# Patient Record
Sex: Female | Born: 1960
Health system: Southern US, Community
[De-identification: ages and names within clinical notes are randomized; demographics above are authoritative.]

## PROBLEM LIST (undated history)

## (undated) DIAGNOSIS — H269 Unspecified cataract: Secondary | ICD-10-CM

## (undated) DIAGNOSIS — M26629 Arthralgia of temporomandibular joint, unspecified side: Secondary | ICD-10-CM

## (undated) DIAGNOSIS — Z87442 Personal history of urinary calculi: Secondary | ICD-10-CM

## (undated) DIAGNOSIS — K219 Gastro-esophageal reflux disease without esophagitis: Secondary | ICD-10-CM

## (undated) DIAGNOSIS — I10 Essential (primary) hypertension: Secondary | ICD-10-CM

## (undated) DIAGNOSIS — T7840XA Allergy, unspecified, initial encounter: Secondary | ICD-10-CM

## (undated) DIAGNOSIS — M1990 Unspecified osteoarthritis, unspecified site: Secondary | ICD-10-CM

## (undated) DIAGNOSIS — N200 Calculus of kidney: Secondary | ICD-10-CM

## (undated) DIAGNOSIS — E785 Hyperlipidemia, unspecified: Secondary | ICD-10-CM

## (undated) HISTORY — PX: ROTATOR CUFF REPAIR: SHX139

## (undated) HISTORY — DX: Allergy, unspecified, initial encounter: T78.40XA

## (undated) HISTORY — DX: Unspecified cataract: H26.9

## (undated) HISTORY — PX: URETERAL STENT PLACEMENT: SHX822

---

## 1998-04-08 HISTORY — PX: ABDOMINAL HYSTERECTOMY: SHX81

## 2006-02-06 ENCOUNTER — Encounter: Admission: RE | Admit: 2006-02-06 | Discharge: 2006-02-06 | Payer: Self-pay | Admitting: Family Medicine

## 2007-04-09 HISTORY — PX: CHOLECYSTECTOMY: SHX55

## 2007-10-28 ENCOUNTER — Emergency Department (HOSPITAL_COMMUNITY): Admission: EM | Admit: 2007-10-28 | Discharge: 2007-10-28 | Payer: Self-pay | Admitting: Emergency Medicine

## 2008-05-25 ENCOUNTER — Encounter (INDEPENDENT_AMBULATORY_CARE_PROVIDER_SITE_OTHER): Payer: Self-pay | Admitting: Surgery

## 2008-05-25 ENCOUNTER — Observation Stay (HOSPITAL_COMMUNITY): Admission: EM | Admit: 2008-05-25 | Discharge: 2008-05-26 | Payer: Self-pay | Admitting: *Deleted

## 2010-07-24 LAB — DIFFERENTIAL
Eosinophils Absolute: 0.1 10*3/uL (ref 0.0–0.7)
Lymphocytes Relative: 9 % — ABNORMAL LOW (ref 12–46)
Lymphs Abs: 0.6 10*3/uL — ABNORMAL LOW (ref 0.7–4.0)
Monocytes Absolute: 0.2 10*3/uL (ref 0.1–1.0)
Neutro Abs: 5.6 10*3/uL (ref 1.7–7.7)
Neutrophils Relative %: 87 % — ABNORMAL HIGH (ref 43–77)

## 2010-07-24 LAB — COMPREHENSIVE METABOLIC PANEL
ALT: 31 U/L (ref 0–35)
Albumin: 4.2 g/dL (ref 3.5–5.2)
Calcium: 9.3 mg/dL (ref 8.4–10.5)
GFR calc Af Amer: >60 mL/min (ref >60)
GFR calc non Af Amer: >60 mL/min (ref >60)
Potassium: 4.1 mEq/L (ref 3.5–5.1)
Sodium: 140 mEq/L (ref 135–145)
Total Bilirubin: 1.5 mg/dL — ABNORMAL HIGH (ref 0.3–1.2)

## 2010-07-24 LAB — POCT I-STAT, CHEM 8
BUN: 24 mg/dL — ABNORMAL HIGH (ref 6–23)
Chloride: 107 mEq/L (ref 96–112)
Glucose, Bld: 120 mg/dL — ABNORMAL HIGH (ref 70–99)
HCT: 46 % (ref 36.0–46.0)
Hemoglobin: 15.6 g/dL — ABNORMAL HIGH (ref 12.0–15.0)

## 2010-07-24 LAB — URINALYSIS, ROUTINE W REFLEX MICROSCOPIC
Bilirubin Urine: NEGATIVE
Hgb urine dipstick: NEGATIVE
Urobilinogen, UA: 1 mg/dL (ref 0.0–1.0)
pH: 8.5 — ABNORMAL HIGH (ref 5.0–8.0)

## 2010-07-24 LAB — CBC
HCT: 44.2 % (ref 36.0–46.0)
Hemoglobin: 15.1 g/dL — ABNORMAL HIGH (ref 12.0–15.0)
MCHC: 34.3 g/dL (ref 30.0–36.0)
MCV: 86.7 fL (ref 78.0–100.0)
RDW: 13.4 % (ref 11.5–15.5)
WBC: 6.4 10*3/uL (ref 4.0–10.5)

## 2010-08-21 NOTE — Consult Note (Signed)
NAMECALLAHAN, PEDDIE                   ACCOUNT NO.:  [id number]   MEDICAL RECORD NO.:  [id number]          PATIENT TYPE:  EMS   LOCATION:  MAJO                         FACILITY:  [facility]   PHYSICIAN:  Thomas A. Cornett, M.D.DATE OF BIRTH:  05-06-1960   DATE OF CONSULTATION:  DATE OF DISCHARGE:                                 CONSULTATION   REQUESTING PHYSICIAN:  Chriss Driver, MD.   REASON FOR CONSULTATION:  Abdominal pain.   HISTORY OF PRESENT ILLNESS:  The patient is a 50 year old female with  about an 8-hour history of severe right upper quadrant pain.  The pain  is in her right upper quadrant.  It is roughly a 6-7/10, constant in  nature, associated with nausea, vomiting, and diarrhea.  It started  about 12:30 this morning and has lasted about 8 hours.  She has been  seen here in the emergency room and given pain medicine, but her pain  persists.  Denies any fever or chills.  Has a history of kidney stones.  Pain is no better with pain medicine, and nothing seems to be helping  it.  She was seen by Dr. Javier Glazier in the emergency room who requested me to  see her due to right upper quadrant pain.  Ultrasound was obtained,  which shows a solitary gallstone without gallbladder wall thickening.  No history of yellowing of the skin.   PAST MEDICAL HISTORY:  1. Renal calculi.  2. Herpes.  3. Gastroesophageal reflux disease.  4. She is post menopausal.   PAST SURGICAL HISTORY:  None.   MEDICATIONS:  Enablex, Valtrex, Naprosyn, Vivelle-Dot patch 5 mg,  Zantac, and Flomax as needed.   ALLERGIES:  CODEINE, MACRODANTIN, and PERCOCET.   FAMILY HISTORY:  Noncontributory during this admission.   SOCIAL HISTORY:  Denies tobacco or alcohol use.  She is married.   REVIEW OF SYSTEMS:  An 8-point review of systems reviewed.  Positive for  mild shortness of breath.  Negative for chest pain.  Negative for  extremity pain.  Negative for headache.  Negative for dizziness.  Positive for some anorexia,  nausea, vomiting and diarrhea, and right  upper quadrant pain.  Otherwise, 8-point review of systems which was  done today was negative.   PHYSICAL EXAMINATION:  VITAL SIGNS:  Temperature 97, pulse 110, blood  pressure 129/81, and respiratory rate is 15.  GENERAL APPEARANCE:  Pleasant female in no apparent distress.  HEENT:  No evidence of jaundice.  The oropharynx is dry.  NECK:  Supple.  Nontender.  Trachea is midline.  No mass.  PULMONARY:  Lungs are clear bilaterally.  Chest wall excursion is normal  and symmetrical.  CARDIOVASCULAR:  Tachycardia without rub, murmur, or gallop.  EXTREMITIES:  Warm and well perfused.  Pulses are 2+ in all four  extremities.  ABDOMEN:  Tender right upper quadrant with positive rebound and  questionable Murphy's sign on physical examination.  No distention.  No  diffuse peritonitis.  No masses or hernia.  EXTREMITIES:  No evidence of edema.  Muscle tone and range of motion  appeared normal.  NEUROLOGICAL:  Glasgow Coma Scale was 15.  Motor and sensory function  are grossly intact.   DIAGNOSTIC STUDIES:  Abdominal ultrasound shows a stone at the  gallbladder neck.  No gallbladder wall thickening.  Common bile duct is  4.2 mm.  White count of 6400, hemoglobin of 15, and platelet count of  174,000.  Sodium 140, potassium 4.1, chloride 103, CO2 24, BUN 22,  creatinine 0.8, and glucose 116.  Liver functions are within normal  limits except for a bilirubin total of 1.5.  Alkaline phosphatase is  normal.   IMPRESSION:  1. Probable early acute cholecystitis by physical examination.  2. History of herpes.  3. Gastroesophageal reflux disease.  4  History of renal calculi.   PLAN:  I have recommended laparoscopic cholecystectomy with  cholangiogram to Ms. Caskey.  By examination, she appears to be having the  beginning of acute cholecystitis and her pain is not being not resolved  with analgesia.  Risk of bleeding, infection, common duct injury, injury  to  stomach, liver, colon, small bowel, abdominal wall, potential blood  clots, are all potential complications of this procedure.  Risk of  conversion as well was discussed with her.  Alternate therapies are  ineffective for this and since she is not responding to medical means,  surgery is her best option.  I have discussed this with her husband and  her, and they agree to proceed.  We will go ahead and set her up for  laparoscopic cholecystectomy with cholangiogram later on today.  We will  go ahead and admit her for IV fluids and antibiotics.      Thomas A. Cornett, M.D.  Electronically Signed     TAC/MEDQ  D:  05/25/2008  T:  05/25/2008  Job:  [id number]   cc:   Chriss Driver, MD

## 2010-08-21 NOTE — Discharge Summary (Signed)
NAMEMARIPAT, Laura Mills                   ACCOUNT NO.:  [id number]   MEDICAL RECORD NO.:  [id number]          PATIENT TYPE:  INP   LOCATION:  5124                         FACILITY:  [facility]   PHYSICIAN:  Maisie Fus A. Cornett, M.D.DATE OF BIRTH:  November 21, 1960   DATE OF ADMISSION:  05/25/2008  DATE OF DISCHARGE:  05/26/2008                               DISCHARGE SUMMARY   ADMITTING DIAGNOSIS:  Acute cholecystitis.   DISCHARGE DIAGNOSIS:  Acute cholecystitis.   PROCEDURE PERFORMED:  Laparoscopic cholecystectomy with intraoperative  cholangiogram.   BRIEF HISTORY:  The patient is a 50 year old female admitted on May 25, 2008, with acute cholecystitis.  She underwent laparoscopic  cholecystectomy.   HOSPITAL COURSE:  Her course is unremarkable.  By postop day 1, she is  discharged home, tolerating her diet with adequate pain control.  Her  wound is clean, dry, and intact.  Abdomen was sore, but otherwise  nonacute.   DISCHARGE INSTRUCTIONS:  She will see me in 2 weeks.  She will be given  a prescription for Vicodin 1-2 tablets q.4 h. p.r.n. pain and may take  ibuprofen 600 mg p.o. q.6 h. p.r.n. pain as well.  She will resume her  home meds as outlined in the medical reconciliation list.   CONDITION AT DISCHARGE:  Improved.      Thomas A. Cornett, M.D.  Electronically Signed     TAC/MEDQ  D:  05/26/2008  T:  05/26/2008  Job:  [id number]

## 2010-08-21 NOTE — Op Note (Signed)
Laura Mills, Laura Mills                   ACCOUNT NO.:  [id number]   MEDICAL RECORD NO.:  [id number]          PATIENT TYPE:  INP   LOCATION:  5124                         FACILITY:  [facility]   PHYSICIAN:  Maisie Fus A. Cornett, M.D.DATE OF BIRTH:  1960/11/18   DATE OF PROCEDURE:  05/25/2008  DATE OF DISCHARGE:                               OPERATIVE REPORT   PREOPERATIVE DIAGNOSIS:  Acute cholecystitis.   POSTOPERATIVE DIAGNOSIS:  Acute cholecystitis.   PROCEDURE:  Laparoscopic cholecystectomy with intraoperative  cholangiogram.   SURGEON:  Thomas A. Cornett, MD   ASSISTANT:  Ollen Gross. Vernell Morgans, MD   ANESTHESIA:  General endotracheal anesthesia with 0.25% Sensorcaine  local.   ESTIMATED BLOOD LOSS:  20 mL.   SPECIMEN:  Gallbladder to pathology.   DRAINS:  None.   INDICATIONS FOR PROCEDURE:  The patient is a 50 year old female with  acute cholecystitis.  She presents to the operating room for  laparoscopic cholecystectomy with cholangiogram for acute cholecystitis.  Informed consent was obtained which is outlined in the history and  physical.   DESCRIPTION OF PROCEDURE:  The patient was brought to the operating room  and placed supine.  After induction of general anesthesia, the abdomen  was prepped and draped in a sterile fashion.  She received perioperative  antibiotics.  A 1-cm infraumbilical incision was made.  Dissection was  carried down to her fascia.  Fascia was opened in the midline and a  pursestring suture was placed in the fascia of 0 Vicryl.  I used a Kelly  clamp to open the peritoneum, placed my finger inside the abdominal  cavity, felt around and felt no adhesions.  Next, a 12-mm Hasson cannula  was placed under direct vision.  Pneumoperitoneum was created to 15 mmHg  of CO2 and a laparoscope was placed.  Laparoscopy was then performed.  The patient was placed in reverse Trendelenburg and rolled to her left.  Brief examination of the intra-abdominal cavity revealed an  inflamed  gallbladder, otherwise no other significant abnormality of the colon,  small bowel, colon or liver.   A 11-mm subxiphoid port was placed under direct vision.  Two 5 mm ports  were placed in the right upper quadrant under direct vision.  Gallbladder was grabbed by its dome and retracted to the patient's right  shoulder.  Second grasper was used to grab the infundibulum and pulled  to the patient's right lower quadrant.  We used dissection around the  infundibulum to identify the cystic duct as it entered the gallbladder.  Adequate length was obtained in the cystic duct and a clip was placed in  the gallbladder side of this.  Small incision was placed in the cystic  duct and through a separate stab incision, a Cook cholangiogram catheter  was placed for intraoperative cholangiogram.  This was performed with  one half-strength Hypaque dye.  There was free flow of contrast down the  cystic duct into the common bile duct with free flow of contrast from  the common bile duct into the duodenum.  There was free flow of contrast  into the common hepatic duct to the right and left hepatic ductules with  no signs of stones, stricture, extravasation, or obstruction.  The  catheter was then removed.  The cystic duct was then triple clipped and  divided.  Cystic artery was divided between clips.  Cautery was used to  dissect the gallbladder from gallbladder fossa.  Small clips were placed  in the posterior branches of the cystic artery.  There was some leakage  of bile from the gallbladder, but no spillage or stones noted.  Hook  cautery was used to dissect the gallbladder from the gallbladder fossa  and placed it in an EndoCatch bag.  There was some oozing in liver bed,  controlled with cautery and Surgicel.  Irrigation was suctioned out  until clear.  The patient was then flattened out.  Gallbladder was  extracted through the umbilicus and passed off the field as specimen.  EndoCatch bag  was used for this.  We reinspected the gallbladder bed,  saw no evidence of bleeding or bile leakage.  Excess irrigation was  suctioned out.  Ports were removed with no signs of port site bleeding  and the CO2 was allowed to escape.  Umbilical port was closed with  pursestring suture of 0 Vicryl already in place.  Once the ports were  removed, 4-0 Monocryl was used to close skin.  Dermabond was applied.  All final counts of sponge, needle, and instruments were found to be  correct at this portion of the case.  The patient was awoken and taken  to recovery in satisfactory condition.      Thomas A. Cornett, M.D.  Electronically Signed     TAC/MEDQ  D:  05/25/2008  T:  05/26/2008  Job:  [id number]

## 2011-01-04 LAB — URINALYSIS, ROUTINE W REFLEX MICROSCOPIC
Bilirubin Urine: NEGATIVE
Ketones, ur: NEGATIVE
Urobilinogen, UA: 1
pH: 6

## 2011-01-04 LAB — POCT I-STAT, CHEM 8
Chloride: 107
Glucose, Bld: 94
Potassium: 4.5

## 2011-01-04 LAB — CBC
HCT: 40.1
Hemoglobin: 13.4
MCV: 88.3
Platelets: 235
WBC: 5.6

## 2011-01-04 LAB — URINE MICROSCOPIC-ADD ON

## 2011-01-04 LAB — OCCULT BLOOD X 1 CARD TO LAB, STOOL: Fecal Occult Bld: NEGATIVE

## 2011-01-04 LAB — DIFFERENTIAL
Basophils Relative: 1
Lymphs Abs: 2.6
Neutro Abs: 2.3
Neutrophils Relative %: 42 — ABNORMAL LOW

## 2011-12-31 ENCOUNTER — Encounter (HOSPITAL_COMMUNITY): Payer: Self-pay | Admitting: Emergency Medicine

## 2011-12-31 ENCOUNTER — Emergency Department (HOSPITAL_COMMUNITY): Payer: Managed Care, Other (non HMO)

## 2011-12-31 ENCOUNTER — Emergency Department (HOSPITAL_COMMUNITY)
Admission: EM | Admit: 2011-12-31 | Discharge: 2011-12-31 | Disposition: A | Discharge status: Home / Self Care | Payer: Managed Care, Other (non HMO) | Attending: Emergency Medicine | Admitting: Emergency Medicine

## 2011-12-31 DIAGNOSIS — R109 Unspecified abdominal pain: Secondary | ICD-10-CM | POA: Insufficient documentation

## 2011-12-31 DIAGNOSIS — N201 Calculus of ureter: Secondary | ICD-10-CM | POA: Insufficient documentation

## 2011-12-31 DIAGNOSIS — N390 Urinary tract infection, site not specified: Secondary | ICD-10-CM | POA: Insufficient documentation

## 2011-12-31 DIAGNOSIS — Z90710 Acquired absence of both cervix and uterus: Secondary | ICD-10-CM | POA: Insufficient documentation

## 2011-12-31 DIAGNOSIS — N200 Calculus of kidney: Secondary | ICD-10-CM

## 2011-12-31 LAB — CBC WITH DIFFERENTIAL/PLATELET
Basophils Absolute: 0 10*3/uL (ref 0.0–0.1)
Basophils Relative: 1 % (ref 0–1)
Eosinophils Absolute: 0.1 10*3/uL (ref 0.0–0.7)
Eosinophils Relative: 1 % (ref 0–5)
Lymphs Abs: 2.3 10*3/uL (ref 0.7–4.0)
MCH: 28.2 pg (ref 26.0–34.0)
Neutrophils Relative %: 56 % (ref 43–77)
Platelets: 212 10*3/uL (ref 150–400)
RBC: 5.42 MIL/uL — ABNORMAL HIGH (ref 3.87–5.11)
RDW: 14 % (ref 11.5–15.5)
WBC: 6.4 10*3/uL (ref 4.0–10.5)

## 2011-12-31 LAB — URINALYSIS, ROUTINE W REFLEX MICROSCOPIC
Bilirubin Urine: NEGATIVE
Glucose, UA: NEGATIVE mg/dL
Ketones, ur: NEGATIVE mg/dL
Protein, ur: NEGATIVE mg/dL
pH: 7.5 (ref 5.0–8.0)

## 2011-12-31 LAB — URINE MICROSCOPIC-ADD ON

## 2011-12-31 LAB — WET PREP, GENITAL
Trich, Wet Prep: NONE SEEN
Yeast Wet Prep HPF POC: NONE SEEN

## 2011-12-31 MED ORDER — KETOROLAC TROMETHAMINE 60 MG/2ML IM SOLN
60.0000 mg | Freq: Once | INTRAMUSCULAR | Status: AC
  Administered 2011-12-31: 60 mg via INTRAMUSCULAR
  Filled 2011-12-31: qty 2

## 2011-12-31 MED ORDER — CIPROFLOXACIN HCL 500 MG PO TABS: 500.0000 mg | ORAL_TABLET | Freq: Two times a day (BID) | ORAL | Status: DC

## 2011-12-31 MED ORDER — HYDROCODONE-ACETAMINOPHEN 5-325 MG PO TABS: 1.0000 | ORAL_TABLET | Freq: Four times a day (QID) | ORAL | Status: DC | PRN

## 2011-12-31 MED ORDER — ONDANSETRON HCL 4 MG/2ML IJ SOLN
4.0000 mg | Freq: Once | INTRAMUSCULAR | Status: AC
  Administered 2011-12-31: 4 mg via INTRAVENOUS
  Filled 2011-12-31: qty 2

## 2011-12-31 MED ORDER — HYDROMORPHONE HCL PF 1 MG/ML IJ SOLN
1.0000 mg | Freq: Once | INTRAMUSCULAR | Status: AC
  Administered 2011-12-31: 1 mg via INTRAVENOUS
  Filled 2011-12-31: qty 1

## 2011-12-31 MED ORDER — ONDANSETRON 4 MG PO TBDP
4.0000 mg | ORAL_TABLET | Freq: Once | ORAL | Status: AC
  Administered 2011-12-31: 4 mg via ORAL
  Filled 2011-12-31: qty 1

## 2011-12-31 MED ORDER — MORPHINE SULFATE 4 MG/ML IJ SOLN
6.0000 mg | Freq: Once | INTRAMUSCULAR | Status: AC
  Administered 2011-12-31: 6 mg via INTRAVENOUS
  Filled 2011-12-31 (×2): qty 1

## 2011-12-31 MED ORDER — CIPROFLOXACIN IN D5W 400 MG/200ML IV SOLN
400.0000 mg | Freq: Once | INTRAVENOUS | Status: AC
  Administered 2011-12-31: 400 mg via INTRAVENOUS
  Filled 2011-12-31: qty 200

## 2011-12-31 NOTE — ED Notes (Signed)
Patient transported to CT 

## 2011-12-31 NOTE — ED Notes (Signed)
PT. WOKE UP THIS MORNING WITH MID/LOW BACK PAIN AND VAGINAL PAIN WITH VOMITTING STATES PAIN SIMILAR TO HER KIDNEY STONES IN THE PAST. DENIES HEMATURIA.

## 2011-12-31 NOTE — ED Notes (Signed)
Report received, assumed care.  

## 2011-12-31 NOTE — ED Notes (Signed)
Pt sts allergic to Cipro, chart taken to NP Felicie Morn for eval and he changed rx to  Keflex 500mg  BID x 7 Days,  Pt notified.  Called into [facility] at [phone]

## 2011-12-31 NOTE — ED Provider Notes (Signed)
Medical screening examination/treatment/procedure(s) were performed by non-physician practitioner and as supervising physician I was immediately available for consultation/collaboration.  Rosanne Ashing, MD 12/31/11 2258

## 2011-12-31 NOTE — ED Provider Notes (Signed)
History     CSN: [id number]  Arrival date & time 12/31/11  4098   First MD Initiated Contact with Patient 12/31/11 [phone]      Chief Complaint  Patient presents with  . Back Pain    (Consider location/radiation/quality/duration/timing/severity/associated sxs/prior treatment) HPI  51 year old female with history of kidney stones presents complaining of back pain. Patient reports she was awoke this morning with sharp pain to her vagina which radiates up toward her right flank. Pain acute on onset, sharp, constant, and reminiscent of the previous kidney stone she had in the past. Patient felt nausea and vomited twice with non-bilious, nonbloody vomits.  Nothing seems to make pain worse, or better. Patient reports she was feeling normal last night. She denies fever, chills, chest pain, shortness of breath, dysuria, vaginal discharge, blood in urine, or rash. She has a history of cholecystectomy and history of abdominal hysterectomy.  Last kidney stones several years ago.    Past Medical History  Diagnosis Date  . Kidney stones     Past Surgical History  Procedure Date  . Cholecystectomy   . Abdominal hysterectomy     No family history on file.  History  Substance Use Topics  . Smoking status: Never Smoker   . Smokeless tobacco: Not on file  . Alcohol Use: No    OB History    Grav Para Term Preterm Abortions TAB SAB Ect Mult Living                  Review of Systems  All other systems reviewed and are negative.    Allergies  Minocin  Home Medications  No current outpatient prescriptions on file.  BP 170/91  Pulse 66  Temp 98.9 F (37.2 C) (Oral)  Resp 16  SpO2 100%  Physical Exam  Nursing note and vitals reviewed. Constitutional: She appears well-developed and well-nourished. She appears distressed.       Appears uncomfortable, lying in bed  HENT:  Head: Normocephalic and atraumatic.  Eyes: Conjunctivae normal are normal.  Neck: Normal range of motion.  Neck supple.  Cardiovascular: Normal rate and regular rhythm.   Abdominal: Soft. There is CVA tenderness. Hernia confirmed negative in the right inguinal area and confirmed negative in the left inguinal area.  Genitourinary: There is no tenderness on the right labia. There is no tenderness on the left labia. Right adnexum displays no tenderness. Left adnexum displays no tenderness. No bleeding around the vagina. No vaginal discharge found.       Chaperone present  Absent uterus from total hysterectomy  Musculoskeletal:       Cervical back: Normal.       Thoracic back: Normal.       Lumbar back: Normal.       Back:  Lymphadenopathy:    She has no cervical adenopathy.    ED Course  Procedures (including critical care time)   Labs Reviewed  URINALYSIS, ROUTINE W REFLEX MICROSCOPIC   Results for orders placed during the hospital encounter of 12/31/11  URINALYSIS, ROUTINE W REFLEX MICROSCOPIC      Component Value Range   Color, Urine YELLOW  YELLOW   APPearance CLOUDY (*) CLEAR   Specific Gravity, Urine 1.017  1.005 - 1.030   pH 7.5  5.0 - 8.0   Glucose, UA NEGATIVE  NEGATIVE mg/dL   Hgb urine dipstick SMALL (*) NEGATIVE   Bilirubin Urine NEGATIVE  NEGATIVE   Ketones, ur NEGATIVE  NEGATIVE mg/dL   Protein, ur NEGATIVE  NEGATIVE mg/dL   Urobilinogen, UA 1.0  0.0 - 1.0 mg/dL   Nitrite NEGATIVE  NEGATIVE   Leukocytes, UA MODERATE (*) NEGATIVE  URINE MICROSCOPIC-ADD ON      Component Value Range   Squamous Epithelial / LPF FEW (*) RARE   WBC, UA 7-10  <3 WBC/hpf   RBC / HPF 3-6  <3 RBC/hpf   Bacteria, UA FEW (*) RARE   Urine-Other AMORPHOUS URATES/PHOSPHATES    CBC WITH DIFFERENTIAL      Component Value Range   WBC 6.4  4.0 - 10.5 K/uL   RBC 5.42 (*) 3.87 - 5.11 MIL/uL   Hemoglobin 15.3 (*) 12.0 - 15.0 g/dL   HCT 16.1  09.6 - 04.5 %   MCV 82.5  78.0 - 100.0 fL   MCH 28.2  26.0 - 34.0 pg   MCHC 34.2  30.0 - 36.0 g/dL   RDW 40.9  81.1 - 91.4 %   Platelets 212  150 - 400  K/uL   Neutrophils Relative 56  43 - 77 %   Neutro Abs 3.6  1.7 - 7.7 K/uL   Lymphocytes Relative 36  12 - 46 %   Lymphs Abs 2.3  0.7 - 4.0 K/uL   Monocytes Relative 6  3 - 12 %   Monocytes Absolute 0.4  0.1 - 1.0 K/uL   Eosinophils Relative 1  0 - 5 %   Eosinophils Absolute 0.1  0.0 - 0.7 K/uL   Basophils Relative 1  0 - 1 %   Basophils Absolute 0.0  0.0 - 0.1 K/uL  WET PREP, GENITAL      Component Value Range   Yeast Wet Prep HPF POC NONE SEEN  NONE SEEN   Trich, Wet Prep NONE SEEN  NONE SEEN   Clue Cells Wet Prep HPF POC FEW (*) NONE SEEN   WBC, Wet Prep HPF POC FEW (*) NONE SEEN   Ct Abdomen Pelvis Wo Contrast  12/31/2011  *RADIOLOGY REPORT*  Clinical Data: Severe right flank and groin pain with nausea and vomiting.  History of renal calculi.  CT ABDOMEN AND PELVIS WITHOUT CONTRAST  Technique:  Multidetector CT imaging of the abdomen and pelvis was performed following the standard protocol without intravenous contrast.  Comparison: Abdominal pelvic CT 01/31/2010.  Findings: Patchy opacity within the lingula may reflect atelectasis.  The lung bases are otherwise clear.  There is no pleural effusion.  Two adjacent calculi within the lower pole of the right kidney have mildly enlarged, the largest measuring 6 mm on image 42.  No other renal calculi are seen.  There is mild right-sided hydronephrosis and perinephric soft tissue stranding.  The right ureter is dilated to the ureteral vesicle junction where there is a tiny (1 mm) calculus on image 75. The bladder is nearly empty and suboptimally evaluated.  No definite bladder calculi are seen.  There are bilateral pelvic phleboliths.  There is no left-sided hydronephrosis or left-sided urinary tract calculus.  There is mild hepatic steatosis.  The gallbladder is surgically absent.  The spleen, pancreas and adrenal glands appear unremarkable.  The bowel gas pattern is normal.  No pelvic inflammatory changes are seen.  There are mild sigmoid colon  diverticular changes.  Lower lumbar spondylosis associated with a convex left scoliosis appears unchanged.  IMPRESSION:  1.  Obstructing 1 mm calculus at the right ureteral vesicle junction. 2.  Enlarging calculi in the lower pole calices of the right kidney. 3.  Lumbar spondylosis and scoliosis.  Original Report Authenticated By: Gerrianne Scale, M.D.     1. Kidney stone, right 2. uti MDM  R flank pain radiates to vagina, prior hx of kidney stones.  Sts pain felt similar.  Currently in pain.  Will obtain UA, give pain meds and zofran.  Will reassess once pt more comfortable.  VSS.  Hypertensive likely 2/2 pain.    7:26 AM Pt feels more comfortable after pain medication.  Her pain is localized to RLQ.  Her UA shows small amount of Hgb, 7-10 WBC.  I will send a urine culture.  Will perform pelvic exam for further evaluatuion.    7:51 AM Pt is vomiting, having increased abd pain.  Will give morphine, labs ordered  9:37 AM Abd/pelvic CT shows 1mm obstructing calculus at the R UVJ, which is consistent with pt's current pain.  She has 2 renal calculi in the R kidney, with largest measuring 6mm.  She has mild R sided hydronephrosis.  REsult were discussed with pt.  Pt still endorse significant pain.  Will continue pain management.  Will give cipro for suspected UTI.  My attending has seen and evaluate pt.    1:15 PM Pt felt much better.  Able to tolerates PO.  abx has finished.  WIll d/c pt with recommendation to f/u with urologist. Pt voice understanding and agrees with plan.    BP 127/77  Pulse 85  Temp 98.7 F (37.1 C) (Oral)  Resp 20  SpO2 96%   Nursing notes reviewed and considered in documentation  Previous records reviewed and considered  All labs/vitals reviewed and considered  xrays reviewed and considered  time spent personally by me on the following activities: Review prior charts, imaging, and labs.  Development of treatment plan with patient and/or surrogate as well as  nursing, discussions with consultants, evaluation of patient's response to treatment, examination of patient, obtaining history from patient or surrogate, ordering and performing treatments and interventions, ordering and review of laboratory studies, ordering and review of radiographic studies, pulse oximetry and re-evaluation of patient's condition.     Fayrene Helper, PA-C 12/31/11 1318

## 2011-12-31 NOTE — ED Notes (Signed)
Results of chem 8 per POCT standards listed below: Na   142 K  4.3 Cl  106 iCa  1.25 TC02   24 Glu  110 BUN  22 Crea  1.0 Hct   46% Hb   15.6 AnGap   

## 2011-12-31 NOTE — ED Notes (Signed)
Pt given Vicodin and Cipro prescription.

## 2012-01-01 LAB — URINE CULTURE: Colony Count: 75000

## 2012-01-01 LAB — POCT I-STAT, CHEM 8
Calcium, Ion: 1.25 mmol/L — ABNORMAL HIGH (ref 1.12–1.23)
HCT: 46 % (ref 36.0–46.0)
Hemoglobin: 15.6 g/dL — ABNORMAL HIGH (ref 12.0–15.0)
Sodium: 142 mEq/L (ref 135–145)
TCO2: 24 mmol/L (ref 0–100)

## 2012-01-01 LAB — GC/CHLAMYDIA PROBE AMP, GENITAL: GC Probe Amp, Genital: NEGATIVE

## 2012-09-04 ENCOUNTER — Other Ambulatory Visit: Payer: Self-pay | Admitting: Urology

## 2012-09-28 ENCOUNTER — Encounter (HOSPITAL_COMMUNITY): Payer: Self-pay | Admitting: Pharmacy Technician

## 2012-09-29 ENCOUNTER — Encounter (HOSPITAL_COMMUNITY): Payer: Self-pay

## 2012-09-29 ENCOUNTER — Encounter (HOSPITAL_COMMUNITY)
Admission: RE | Admit: 2012-09-29 | Discharge: 2012-09-29 | Disposition: A | Discharge status: Home / Self Care | Payer: 59 | Source: Ambulatory Visit | Attending: Urology | Admitting: Urology

## 2012-09-29 HISTORY — DX: Personal history of urinary calculi: Z87.442

## 2012-09-29 HISTORY — DX: Arthralgia of temporomandibular joint, unspecified side: M26.629

## 2012-09-29 HISTORY — DX: Gastro-esophageal reflux disease without esophagitis: K21.9

## 2012-09-29 HISTORY — DX: Unspecified osteoarthritis, unspecified site: M19.90

## 2012-09-29 LAB — BASIC METABOLIC PANEL
Calcium: 9.5 mg/dL (ref 8.4–10.5)
GFR calc Af Amer: >90 mL/min (ref >90)
GFR calc non Af Amer: >90 mL/min (ref >90)
Glucose, Bld: 81 mg/dL (ref 70–99)
Potassium: 4.4 mEq/L (ref 3.5–5.1)
Sodium: 140 mEq/L (ref 135–145)

## 2012-09-29 LAB — CBC
Hemoglobin: 14.2 g/dL (ref 12.0–15.0)
MCH: 28 pg (ref 26.0–34.0)
MCHC: 33.4 g/dL (ref 30.0–36.0)
RDW: 13 % (ref 11.5–15.5)

## 2012-09-29 LAB — SURGICAL PCR SCREEN
MRSA, PCR: NEGATIVE
Staphylococcus aureus: POSITIVE — AB

## 2012-09-29 NOTE — Patient Instructions (Signed)
YOUR SURGERY IS SCHEDULED AT [facility]  ON:  Friday 6/27  REPORT TO [place] [facility] AT: 9:45 AM      PHONE # FOR SHORT STAY IS [phone]  DO NOT EAT OR DRINK ANYTHING AFTER MIDNIGHT THE NIGHT BEFORE YOUR SURGERY.  YOU MAY BRUSH YOUR TEETH, RINSE OUT YOUR MOUTH--BUT NO WATER, NO FOOD, NO CHEWING GUM, NO MINTS, NO CANDIES, NO CHEWING TOBACCO.  PLEASE TAKE THE FOLLOWING MEDICATIONS THE AM OF YOUR SURGERY WITH A FEW SIPS OF WATER:  OXYBUTYNIN - MAY TAKE PAIN MED DEMEROL IF NEEDED.  DO NOT BRING VALUABLES, MONEY, CREDIT CARDS.  DO NOT WEAR JEWELRY, MAKE-UP, NAIL POLISH AND NO METAL PINS OR CLIPS IN YOUR HAIR. CONTACT LENS, DENTURES / PARTIALS, GLASSES SHOULD NOT BE WORN TO SURGERY AND IN MOST CASES-HEARING AIDS WILL NEED TO BE REMOVED.  BRING YOUR GLASSES CASE, ANY EQUIPMENT NEEDED FOR YOUR CONTACT LENS. FOR PATIENTS ADMITTED TO THE HOSPITAL--CHECK OUT TIME THE DAY OF DISCHARGE IS 11:00 AM.  ALL INPATIENT ROOMS ARE PRIVATE - WITH BATHROOM, TELEPHONE, TELEVISION AND WIFI INTERNET.  IF YOU ARE BEING DISCHARGED THE SAME DAY OF YOUR SURGERY--YOU CAN NOT DRIVE YOURSELF HOME--AND SHOULD NOT GO HOME ALONE BY TAXI OR BUS.  NO DRIVING OR OPERATING MACHINERY FOR 24 HOURS FOLLOWING ANESTHESIA / PAIN MEDICATIONS.  PLEASE MAKE ARRANGEMENTS FOR SOMEONE TO BE WITH YOU AT HOME THE FIRST 24 HOURS AFTER SURGERY. RESPONSIBLE DRIVER'S NAME JOHN Thurlow - PT'S HUSBAND                                               PHONE #  [phone]                            PLEASE READ OVER ANY  FACT SHEETS THAT YOU WERE GIVEN: MRSA INFORMATION, BLOOD TRANSFUSION INFORMATION, INCENTIVE SPIROMETER INFORMATION. FAILURE TO FOLLOW THESE INSTRUCTIONS MAY RESULT IN THE CANCELLATION OF YOUR SURGERY.   PATIENT SIGNATURE_________________________________

## 2012-09-29 NOTE — Pre-Procedure Instructions (Signed)
EKG AND CXR NOT NEEDED PREOP - PER ANESTHESIOLOGIST'S GUIDELINES. 

## 2012-10-01 ENCOUNTER — Other Ambulatory Visit (HOSPITAL_COMMUNITY): Payer: Managed Care, Other (non HMO)

## 2012-10-01 NOTE — H&P (Signed)
History of Present Illness  H&P:            F/u nephrolithiasis. She has always passed her stones. She did pass a calcium oxalate stone. A 24-hour urine for stone risk profile was done in December 2009. Her urinary calcium was slightly elevated and her volume was low at 1300 cc.   -2012 - CT scan however showed two 5 mm stones in the right lower pole that were nonobstructing     -Sept 2013 flank pain, CT A/P - I reviewed all the images. This revealed a 1-2 mm stone at the right ureterovesical junction with mild hydronephrosis. There were 2 right lower pole stones total approximately 8 mm. BUN 22, creatinine 1.0. UA showed microhematuria and pyuria and a urine culture was negative.    She also has some fairly significant orthopedic problems with the right lower back where she has a degenerative joint disease. She takes robaxin, hydrocodone and meloxicam.   Only operation was a hysterectomy in 1999.    Interval Hx  She returns and has had no stone passage or flank. No dysuria or gross hematuria.     KUB today - RLP calcification, slight increase in size, normal bowel gas pattern, bones stable.  Amended By: Jerilee Field; 09/13/2012 9:21 PMEST    Past Medical History Problems  1. History of  Abdominal Pain Above The Pubic Area (Suprapubic) 789.09 2. History of  Abdominal Pain In The Left Lower Belly (LLQ) 789.04 3. History of  Abdominal Pain In The Right Lower Belly (RLQ) 789.03 4. History of  Feelings Of Urinary Urgency 788.63 5. History of  Heartburn 787.1 6. History of  Microscopic Hematuria 599.72 7. History of  Nephrolithiasis Of The Right Kidney V13.01 8. History of  Urethritis 597.80  Surgical History Problems  1. History of  Total Abdominal Hysterectomy  Current Meds 1. Estradiol 2 MG Oral Tablet; Therapy: 22Oct2013 to 2. Meloxicam 7.5 MG Oral Tablet; Therapy: 31Aug2010 to 3. Meperidine HCl 50 MG Oral Tablet; Therapy: 13Dec2013 to 4. Methocarbamol 750 MG Oral  Tablet; Therapy: 03Jul2010 to 5. Multi-Vitamin TABS; Therapy: (Recorded:04Aug2009) to 6. Oxybutynin Chloride 5 MG Oral Tablet; Therapy: 07Jun2011 to 7. PriLOSEC 40 MG Oral Capsule Delayed Release; Therapy: (Recorded:10Aug2011) to 8. TiZANidine HCl 4 MG Oral Tablet; Therapy: 13Dec2013 to 9. ValACYclovir HCl 500 MG Oral Tablet; Therapy: 22Oct2013 to  Allergies Medication  1. Macrodantin CAPS  Family History Problems  1. Paternal history of  Anemia V18.2 2. Family history of  Family Health Status - Father's Age 60 3. Family history of  Family Health Status - Mother's Age 25 4. Family history of  Family Health Status Number Of Children 2 sons 22,18 5. Paternal history of  Heart Disease V17.49 6. Paternal history of  Hematuria 7. Paternal history of  Nephrectomy 8. Paternal history of  Nephrolithiasis 9. Paternal history of  Renal Cell Carcinoma V16.51  Social History Problems  1. Caffeine Use 1-2 per day 2. Marital History - Currently Married 3. 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 History of  Alcohol Use  Vitals Vital Signs [Data Includes: Last 1 Day]  28May2014 03:29PM  Blood Pressure: 130 / 87 Temperature: 98 F Heart Rate: 75  Physical Exam Constitutional: Well nourished and well developed . No acute distress.  Pulmonary: No respiratory distress and normal respiratory rhythm and effort.  Cardiovascular: Heart rate and rhythm are normal . No peripheral edema.  Neuro/Psych:. Mood and affect are appropriate.  Results/Data Urine [Data Includes: Last 1 Day]   28May2014  COLOR YELLOW   APPEARANCE CLEAR   SPECIFIC GRAVITY 1.030   pH 5.5   GLUCOSE NEG mg/dL  BILIRUBIN NEG   KETONE NEG mg/dL  BLOOD TRACE   PROTEIN NEG mg/dL  UROBILINOGEN 0.2 mg/dL  NITRITE NEG   LEUKOCYTE ESTERASE NEG   SQUAMOUS EPITHELIAL/HPF RARE   WBC NONE SEEN WBC/hpf  RBC 0-2 RBC/hpf  BACTERIA RARE   CRYSTALS NONE SEEN   CASTS NONE SEEN     Assessment Assessed  1. Nephrolithiasis 592.0  Plan Health Maintenance (V70.0)  1. UA With REFLEX  Done: 28May2014 02:53PM Nephrolithiasis (592.0)  2. Follow-up Schedule Surgery Office  Follow-up  Requested for: 28May2014  Discussion/Summary  We discussed the stones are a bit bigger compared to 2011 CT and 2009 KUB. She is inclined to go ahead and treat. We discussed nature, R/B of continued surveillance, endoscopic management or ESWL. She doesnt want to have to "pass" any fragments and need pain medicine and "Flomax". She wants to go ahead with endoscopic management and we discussed need for pre-stenting on occasion. We discussed stent pain. We discussed risk of ureteral injury with URS among others.    Signatures Electronically signed by : Jerilee Field, M.D.; Sep 13 2012  9:22PM

## 2012-10-02 ENCOUNTER — Ambulatory Visit (HOSPITAL_COMMUNITY): Payer: 59 | Admitting: Certified Registered Nurse Anesthetist

## 2012-10-02 ENCOUNTER — Encounter (HOSPITAL_COMMUNITY): Payer: Self-pay | Admitting: *Deleted

## 2012-10-02 ENCOUNTER — Encounter (HOSPITAL_COMMUNITY)
Admission: RE | Disposition: A | Discharge status: Home / Self Care | Payer: Self-pay | Source: Ambulatory Visit | Attending: Urology

## 2012-10-02 ENCOUNTER — Ambulatory Visit (HOSPITAL_BASED_OUTPATIENT_CLINIC_OR_DEPARTMENT_OTHER): Admission: RE | Admit: 2012-10-02 | Payer: Managed Care, Other (non HMO) | Source: Ambulatory Visit | Admitting: Urology

## 2012-10-02 ENCOUNTER — Ambulatory Visit (HOSPITAL_COMMUNITY)
Admission: RE | Admit: 2012-10-02 | Discharge: 2012-10-02 | Disposition: A | Discharge status: Home / Self Care | Payer: 59 | Source: Ambulatory Visit | Attending: Urology | Admitting: Urology

## 2012-10-02 ENCOUNTER — Encounter (HOSPITAL_BASED_OUTPATIENT_CLINIC_OR_DEPARTMENT_OTHER): Admission: RE | Payer: Self-pay | Source: Ambulatory Visit

## 2012-10-02 ENCOUNTER — Encounter (HOSPITAL_COMMUNITY): Payer: Self-pay | Admitting: Certified Registered Nurse Anesthetist

## 2012-10-02 DIAGNOSIS — N8111 Cystocele, midline: Secondary | ICD-10-CM | POA: Insufficient documentation

## 2012-10-02 DIAGNOSIS — N1330 Unspecified hydronephrosis: Secondary | ICD-10-CM | POA: Insufficient documentation

## 2012-10-02 DIAGNOSIS — M1990 Unspecified osteoarthritis, unspecified site: Secondary | ICD-10-CM | POA: Insufficient documentation

## 2012-10-02 DIAGNOSIS — N200 Calculus of kidney: Secondary | ICD-10-CM | POA: Insufficient documentation

## 2012-10-02 DIAGNOSIS — Z79899 Other long term (current) drug therapy: Secondary | ICD-10-CM | POA: Insufficient documentation

## 2012-10-02 DIAGNOSIS — Z90710 Acquired absence of both cervix and uterus: Secondary | ICD-10-CM | POA: Insufficient documentation

## 2012-10-02 HISTORY — PX: CYSTOSCOPY WITH URETEROSCOPY: SHX5123

## 2012-10-02 SURGERY — CYSTOSCOPY WITH URETEROSCOPY
Anesthesia: General | Laterality: Right | Wound class: Clean Contaminated

## 2012-10-02 SURGERY — CYSTOSCOPY, WITH CALCULUS MANIPULATION OR REMOVAL
Anesthesia: General | Laterality: Right

## 2012-10-02 MED ORDER — PROPOFOL 10 MG/ML IV BOLUS
INTRAVENOUS | Status: DC | PRN
  Administered 2012-10-02: 200 mg via INTRAVENOUS

## 2012-10-02 MED ORDER — MIDAZOLAM HCL 5 MG/5ML IJ SOLN
INTRAMUSCULAR | Status: DC | PRN
  Administered 2012-10-02: 2 mg via INTRAVENOUS

## 2012-10-02 MED ORDER — OXYCODONE-ACETAMINOPHEN 5-325 MG PO TABS
1.0000 | ORAL_TABLET | ORAL | Status: DC | PRN
  Administered 2012-10-02: 1 via ORAL
  Filled 2012-10-02: qty 1

## 2012-10-02 MED ORDER — LIDOCAINE HCL (CARDIAC) 20 MG/ML IV SOLN
INTRAVENOUS | Status: DC | PRN
  Administered 2012-10-02: 75 mg via INTRAVENOUS

## 2012-10-02 MED ORDER — PROMETHAZINE HCL 25 MG/ML IJ SOLN: 6.2500 mg | INTRAMUSCULAR | Status: DC | PRN

## 2012-10-02 MED ORDER — FENTANYL CITRATE 0.05 MG/ML IJ SOLN
INTRAMUSCULAR | Status: DC | PRN
  Administered 2012-10-02 (×2): 50 ug via INTRAVENOUS
  Administered 2012-10-02: 100 ug via INTRAVENOUS

## 2012-10-02 MED ORDER — ONDANSETRON HCL 4 MG/2ML IJ SOLN
INTRAMUSCULAR | Status: DC | PRN
  Administered 2012-10-02: 4 mg via INTRAVENOUS

## 2012-10-02 MED ORDER — CEPHALEXIN 500 MG PO CAPS: 500.0000 mg | ORAL_CAPSULE | Freq: Four times a day (QID) | ORAL | Status: DC

## 2012-10-02 MED ORDER — CEFAZOLIN SODIUM-DEXTROSE 2-3 GM-% IV SOLR
2.0000 g | INTRAVENOUS | Status: AC
  Administered 2012-10-02: 2 g via INTRAVENOUS

## 2012-10-02 MED ORDER — CEFAZOLIN SODIUM-DEXTROSE 2-3 GM-% IV SOLR
INTRAVENOUS | Status: AC
  Filled 2012-10-02: qty 50

## 2012-10-02 MED ORDER — LACTATED RINGERS IV SOLN
INTRAVENOUS | Status: DC | PRN
  Administered 2012-10-02: 11:00:00 via INTRAVENOUS

## 2012-10-02 MED ORDER — FENTANYL CITRATE 0.05 MG/ML IJ SOLN: 25.0000 ug | INTRAMUSCULAR | Status: DC | PRN

## 2012-10-02 MED ORDER — SODIUM CHLORIDE 0.9 % IR SOLN
Status: DC | PRN
  Administered 2012-10-02: 4000 mL

## 2012-10-02 MED ORDER — LACTATED RINGERS IV SOLN: INTRAVENOUS | Status: DC

## 2012-10-02 MED ORDER — IOHEXOL 300 MG/ML  SOLN
INTRAMUSCULAR | Status: DC | PRN
  Administered 2012-10-02: 10 mL

## 2012-10-02 MED ORDER — OXYCODONE-ACETAMINOPHEN 5-325 MG PO TABS: 1.0000 | ORAL_TABLET | ORAL | Status: DC | PRN

## 2012-10-02 MED ORDER — IOHEXOL 300 MG/ML  SOLN
INTRAMUSCULAR | Status: AC
  Filled 2012-10-02: qty 1

## 2012-10-02 MED ORDER — MEPERIDINE HCL 50 MG/ML IJ SOLN: 6.2500 mg | INTRAMUSCULAR | Status: DC | PRN

## 2012-10-02 SURGICAL SUPPLY — 17 items
ADAPTER CATH URET PLST 4-6FR (CATHETERS) ×1 IMPLANT
ADPR CATH URET STRL DISP 4-6FR (CATHETERS)
BAG URO CATCHER STRL LF (DRAPE) ×3 IMPLANT
CATH INTERMIT  6FR 70CM (CATHETERS) ×3 IMPLANT
CATH URET 5FR 28IN OPEN ENDED (CATHETERS) ×3 IMPLANT
CLOTH BEACON ORANGE TIMEOUT ST (SAFETY) ×3 IMPLANT
CONT SPEC 4OZ CLIKSEAL STRL BL (MISCELLANEOUS) ×1 IMPLANT
DRAPE CAMERA CLOSED 9X96 (DRAPES) ×3 IMPLANT
GLOVE BIOGEL M STRL SZ7.5 (GLOVE) ×3 IMPLANT
GOWN PREVENTION PLUS XLARGE (GOWN DISPOSABLE) ×3 IMPLANT
GOWN STRL NON-REIN LRG LVL3 (GOWN DISPOSABLE) ×3 IMPLANT
GOWN STRL REIN XL XLG (GOWN DISPOSABLE) ×3 IMPLANT
MANIFOLD NEPTUNE II (INSTRUMENTS) ×3 IMPLANT
PACK CYSTO (CUSTOM PROCEDURE TRAY) ×3 IMPLANT
STENT CONTOUR 6FRX24X.038 (STENTS) ×2 IMPLANT
TUBING CONNECTING 10 (TUBING) ×3 IMPLANT
WIRE COONS/BENSON .038X145CM (WIRE) ×3 IMPLANT

## 2012-10-02 NOTE — Anesthesia Postprocedure Evaluation (Signed)
  Anesthesia Post-op Note  Patient: Laura Mills  Procedure(s) Performed: Procedure(s) (LRB): CYSTOSCOPY WITH RIGHT RETROGRADE AND STENT PLACEMENT  (Right)  Patient Location: PACU  Anesthesia Type: General  Level of Consciousness: awake and alert   Airway and Oxygen Therapy: Patient Spontanous Breathing  Post-op Pain: mild  Post-op Assessment: Post-op Vital signs reviewed, Patient's Cardiovascular Status Stable, Respiratory Function Stable, Patent Airway and No signs of Nausea or vomiting  Last Vitals:  Filed Vitals:   10/02/12 1245  BP: 131/91  Pulse: 78  Temp: 36.4 C  Resp: 16    Post-op Vital Signs: stable   Complications: No apparent anesthesia complications

## 2012-10-02 NOTE — Transfer of Care (Signed)
Immediate Anesthesia Transfer of Care Note  Patient: Laura Mills  Procedure(s) Performed: Procedure(s): CYSTOSCOPY WITH RIGHT RETROGRADE AND STENT PLACEMENT  (Right)  Patient Location: PACU  Anesthesia Type:General  Level of Consciousness: awake, oriented and patient cooperative  Airway & Oxygen Therapy: Patient Spontanous Breathing and Patient connected to face mask oxygen  Post-op Assessment: Report given to PACU RN and Post -op Vital signs reviewed and stable  Post vital signs: Reviewed and stable  Complications: No apparent anesthesia complications

## 2012-10-02 NOTE — Op Note (Signed)
Preoperative diagnosis: Right lower pole stone Postoperative diagnosis: Right lower pole stone  Procedure: Cystoscopy Right retrograde pyelogram Right ureteral stent placement  Surgeon: Mena Goes  Anesthesia: Gen.  Findings: On cystoscopy the urethra was normal. There was a mild cystocele. The trigone and ureteral orifices.  Were in their normal orthotopic position. The right ureteral orifice was quite narrow and I was just able to access it a 5 French catheter for a retrograde. A 6 French catheter did not initially advance.   Right retrograde pyelogram - on scout imaging there is a calcification in the expected location of the right lower pole. After injection of contrast a single ureter single collecting system unit was visualized. There was no ureteral stricture, filling defect or dilation. The collecting system and renal pelvis appeared normal without filling defect or dilation apart from the lower pole which contained the stone.  Description of procedure:  After consent was obtained the patient was brought to the operating room. After adequate anesthesia she was placed in lithotomy position and prepped and draped in the usual fashion. A timeout was performed to confirm the patient and procedure. Cystoscopy was undertaken and the bladder container tumor stone or foreign body. I attempted to cannulate the right ureteral orifice with a 6 [place] open-ended catheter but the ureteral orifice was quite tight. A cone tip catheter was not readily available therefore I tried to cannulate with a 5 French catheter. This was initially difficult but did advance. Right retrograde contrast was injected. A Benson wire was advanced and coiled in the upper pole collecting system. I then tried to pass a dual-lumen exchange catheter to dilate the ureteral orifice and advance a safety wire but this would not advance despite multiple attempts. Therefore the decision was made to leave a ureteral stent to allow the  ureter to dilate. We can consider ureteroscopy or shockwave. The patient was awakened taken to recovery room in stable condition.   Complications: None  Blood loss: Minimal  Specimens: None   Drains: 6 x 24 cm right ureteral stent without tether   Disposition: Patient stable to PACU.

## 2012-10-02 NOTE — Interval H&P Note (Signed)
History and Physical Interval Note:  10/02/2012 11:12 AM  Laura Mills  has presented today for surgery, with the diagnosis of right nephrolithiasis   The various methods of treatment have been discussed with the patient and family. After consideration of risks, benefits and other options for treatment, the patient has consented to  Procedure(s): CYSTOSCOPY WITH RIGHT URETEROSCOPY AND STENT PLACEMENT  (Right) HOLMIUM LASER APPLICATION (N/A) as a surgical intervention .  The patient's history has been reviewed, patient examined, no change in status, stable for surgery.  I have reviewed the patient's chart and labs. I discussed side effects of the proposed treatment, the likelihood of the patient achieving the goals of the procedure, and any potential problems that might occur during the procedure or recuperation. We discussed again possible need for staged procedure (2nd look URS or ESWL). Also, ureteral injury and stent discomfort among others. Questions were answered to the patient's satisfaction.   She elects to proceed.    Antony Haste

## 2012-10-02 NOTE — Anesthesia Preprocedure Evaluation (Addendum)
Anesthesia Evaluation  Patient identified by MRN, date of birth, ID band Patient awake    Reviewed: Allergy & Precautions, H&P , NPO status , Patient's Chart, lab work & pertinent test results  Airway Mallampati: II TM Distance: >3 FB Neck ROM: Full    Dental no notable dental hx.    Pulmonary neg pulmonary ROS,  breath sounds clear to auscultation  Pulmonary exam normal       Cardiovascular negative cardio ROS  Rhythm:Regular Rate:Normal     Neuro/Psych negative neurological ROS  negative psych ROS   GI/Hepatic negative GI ROS, Neg liver ROS, GERD-  Medicated and Controlled,  Endo/Other  negative endocrine ROS  Renal/GU negative Renal ROS  negative genitourinary   Musculoskeletal negative musculoskeletal ROS (+)   Abdominal   Peds negative pediatric ROS (+)  Hematology negative hematology ROS (+)   Anesthesia Other Findings   Reproductive/Obstetrics negative OB ROS                           Anesthesia Physical Anesthesia Plan  ASA: I  Anesthesia Plan: General   Post-op Pain Management:    Induction: Intravenous  Airway Management Planned: LMA  Additional Equipment:   Intra-op Plan:   Post-operative Plan:   Informed Consent: I have reviewed the patients History and Physical, chart, labs and discussed the procedure including the risks, benefits and alternatives for the proposed anesthesia with the patient or authorized representative who has indicated his/her understanding and acceptance.   Dental advisory given  Plan Discussed with: CRNA  Anesthesia Plan Comments:         Anesthesia Quick Evaluation

## 2012-10-05 ENCOUNTER — Other Ambulatory Visit: Payer: Self-pay | Admitting: Urology

## 2012-10-05 ENCOUNTER — Encounter (HOSPITAL_COMMUNITY): Payer: Self-pay | Admitting: Urology

## 2012-10-06 DIAGNOSIS — Z87442 Personal history of urinary calculi: Secondary | ICD-10-CM

## 2012-10-06 HISTORY — PX: LITHOTRIPSY: SUR834

## 2012-10-06 HISTORY — DX: Personal history of urinary calculi: Z87.442

## 2012-10-12 ENCOUNTER — Encounter (HOSPITAL_COMMUNITY): Payer: Self-pay | Admitting: *Deleted

## 2012-10-14 NOTE — H&P (Signed)
  H&P    History of Present Illness: RLP stone. Pt wanted to make sure she did not get obstruction from any fragments following lithotripsy therefore we attempted ureteroscopy but could not get retrograde access June 2014. We discussed proceeding with shockwave lithotripsy or second look ureteroscopy and she elected to shockwave lithotripsy. She has been well. No complaints today.   Past Medical History  Diagnosis Date  . History of kidney stones   . TMJ syndrome     LEFT   . Arthritis     LUMBAR SPINE AND RIGHT HIP  . GERD (gastroesophageal reflux disease)   . History of kidney stones 10/2012   Past Surgical History  Procedure Laterality Date  . Cholecystectomy    . Abdominal hysterectomy    . Cystoscopy with ureteroscopy Right 10/02/2012    Procedure: CYSTOSCOPY WITH RIGHT RETROGRADE AND STENT PLACEMENT ;  Surgeon: Antony Haste, MD;  Location: [facility];  Service: Urology;  Laterality: Right;    Home Medications:  No prescriptions prior to admission   Allergies:  Allergies  Allergen Reactions  . Ciprofloxacin Other (See Comments)    Sharp pain in my shoulders and body aches  . Macrodantin (Nitrofurantoin) Hives  . Minocin (Minocycline Hcl) Hives    History reviewed. No pertinent family history. Social History:  reports that she has never smoked. She does not have any smokeless tobacco history on file. She reports that she does not drink alcohol or use illicit drugs.  ROS: A complete review of systems was performed.  All systems are negative except for pertinent findings as noted. @ROS @   Physical Exam:  Vital signs in last 24 hours: t  97.8 hr 83 resp 15 spO2 100%   General:  Alert and oriented, No acute distress HEENT: Normocephalic, atraumatic Neck: No JVD or lymphadenopathy Cardiovascular: Regular rate and rhythm Lungs: Regular rate and effort Abdomen: Soft, nontender, nondistended, no abdominal masses Back: No CVA tenderness Extremities: No  edema Neurologic: Grossly intact  Laboratory Data:  No results found for this or any previous visit (from the past 24 hour(s)). No results found for this or any previous visit (from the past 240 hour(s)). Creatinine: No results found for this basename: CREATININE,  in the last 168 hours  Impression/Assessment:  RLP stone s/p stent  Plan:  I discussed with the patient the nature, potential benefits, risks and alternatives to right ESWL, including side effects of the proposed treatment, the likelihood of the patient achieving the goals of the procedure, and any potential problems that might occur during the procedure or recuperation. All questions answered. Patient elects to proceed. We discussed hopefully she will get excellent fragmentation and we will simply be able to remove the stent in the office.   Antony Haste

## 2012-10-15 ENCOUNTER — Encounter (HOSPITAL_COMMUNITY)
Admission: RE | Disposition: A | Discharge status: Home / Self Care | Payer: Self-pay | Source: Ambulatory Visit | Attending: Urology

## 2012-10-15 ENCOUNTER — Ambulatory Visit (HOSPITAL_COMMUNITY)
Admission: RE | Admit: 2012-10-15 | Discharge: 2012-10-15 | Disposition: A | Discharge status: Home / Self Care | Payer: 59 | Source: Ambulatory Visit | Attending: Urology | Admitting: Urology

## 2012-10-15 ENCOUNTER — Ambulatory Visit (HOSPITAL_COMMUNITY): Payer: 59

## 2012-10-15 ENCOUNTER — Encounter (HOSPITAL_COMMUNITY): Payer: Self-pay | Admitting: *Deleted

## 2012-10-15 DIAGNOSIS — K219 Gastro-esophageal reflux disease without esophagitis: Secondary | ICD-10-CM | POA: Insufficient documentation

## 2012-10-15 DIAGNOSIS — N200 Calculus of kidney: Secondary | ICD-10-CM | POA: Insufficient documentation

## 2012-10-15 DIAGNOSIS — Z87442 Personal history of urinary calculi: Secondary | ICD-10-CM | POA: Insufficient documentation

## 2012-10-15 SURGERY — LITHOTRIPSY, ESWL
Anesthesia: LOCAL | Laterality: Right

## 2012-10-15 MED ORDER — CEFAZOLIN SODIUM-DEXTROSE 2-3 GM-% IV SOLR
2.0000 g | INTRAVENOUS | Status: AC
  Administered 2012-10-15: 2 g via INTRAVENOUS
  Filled 2012-10-15: qty 50

## 2012-10-15 MED ORDER — DIPHENHYDRAMINE HCL 25 MG PO CAPS
25.0000 mg | ORAL_CAPSULE | ORAL | Status: AC
  Administered 2012-10-15: 25 mg via ORAL
  Filled 2012-10-15: qty 1

## 2012-10-15 MED ORDER — OXYBUTYNIN CHLORIDE 5 MG PO TABS: 5.0000 mg | ORAL_TABLET | Freq: Three times a day (TID) | ORAL | Status: DC

## 2012-10-15 MED ORDER — SODIUM CHLORIDE 0.9 % IV SOLN
INTRAVENOUS | Status: DC
  Administered 2012-10-15: 06:00:00 via INTRAVENOUS

## 2012-10-15 MED ORDER — DIAZEPAM 5 MG PO TABS
10.0000 mg | ORAL_TABLET | ORAL | Status: AC
Start: 2012-10-15 — End: 2012-10-15
  Administered 2012-10-15: 10 mg via ORAL
  Filled 2012-10-15: qty 2

## 2012-10-15 NOTE — Brief Op Note (Signed)
10/15/2012  8:21 AM  PATIENT:  Laura Mills  52 y.o. female  PRE-OPERATIVE DIAGNOSIS:  RIGHT NEPHROLITHIASIS  POST-OPERATIVE DIAGNOSIS:  * No post-op diagnosis entered *  PROCEDURE:  Procedure(s): RIGHT EXTRACORPOREAL SHOCK WAVE LITHOTRIPSY (ESWL) (Right)  SURGEON:  Surgeon(s) and Role:    * Antony Haste, MD - Primary   DICTATION: .Other Dictation: Dictation Number op note scanned  PLAN OF CARE: Discharge to home after PACU  PATIENT DISPOSITION:  PACU - hemodynamically stable.   Delay start of Pharmacological VTE agent (>24hrs) due to surgical blood loss or risk of bleeding: yes

## 2012-10-29 ENCOUNTER — Other Ambulatory Visit: Payer: Self-pay | Admitting: Urology

## 2012-11-05 ENCOUNTER — Encounter (HOSPITAL_COMMUNITY): Payer: Self-pay | Admitting: *Deleted

## 2012-11-06 ENCOUNTER — Encounter (HOSPITAL_COMMUNITY): Payer: Self-pay | Admitting: Pharmacy Technician

## 2012-11-12 NOTE — H&P (Signed)
History of Present Illness     F/u nephrolithiasis. She has always passed her stones. She did pass a calcium oxalate stone. A 24-hour urine for stone risk profile was done in December 2009. Her urinary calcium was slightly elevated and her volume was low at 1300 cc.   -2012 -  CT scan however showed two 5 mm stones in the right lower pole that were nonobstructing  -Sept 2013 flank pain,  CT A/P - I reviewed all the images. This revealed a 1-2 mm stone at the right ureterovesical junction with mild hydronephrosis. There were 2 right lower pole stones total approximately 8 mm. BUN 22, creatinine 1.0. UA showed microhematuria and pyuria and a urine culture was negative. -May 2014 - increase RLP stone -Jun 2014 right stent -Jul 2014 right ESWL  She also has some fairly significant orthopedic problems with the right lower back where she has a degenerative joint disease. She takes robaxin, hydrocodone and meloxicam.    Only operation was a hysterectomy in 1999.    Interval Hx She returns and underwent right shock wave lithotripsy. She is tolerating the stent very well. She passed a few grains of sand.   KUB today -4 millimeter, 3 mm, and 2 mm right lower pole fragments. Stent is in good position. Normal glands a bowel gas pattern.     Past Medical History Problems  1. History of  Abdominal Pain Above The Pubic Area (Suprapubic) 789.09 2. History of  Abdominal Pain In The Left Lower Belly (LLQ) 789.04 3. History of  Abdominal Pain In The Right Lower Belly (RLQ) 789.03 4. History of  Feelings Of Urinary Urgency 788.63 5. History of  Heartburn 787.1 6. History of  Microscopic Hematuria 599.72 7. History of  Nephrolithiasis Of The Right Kidney V13.01 8. History of  Urethritis 597.80  Surgical History Problems  1. History of  Cystoscopy With Insertion Of Ureteral Stent Right 2. History of  Lithotripsy 3. History of  Total Abdominal Hysterectomy  Current Meds 1. Estradiol 2 MG Oral  Tablet; Therapy: 22Oct2013 to 2. Meloxicam 7.5 MG Oral Tablet; Therapy: 31Aug2010 to 3. Meperidine HCl 50 MG Oral Tablet; Therapy: 13Dec2013 to 4. Methocarbamol 750 MG Oral Tablet; Therapy: 03Jul2010 to 5. Multi-Vitamin TABS; Therapy: (Recorded:04Aug2009) to 6. Oxybutynin Chloride 5 MG Oral Tablet; Therapy: 07Jun2011 to 7. Oxycodone-Acetaminophen 5-325 MG Oral Tablet; TAKE 1-2 PO Q4-6 HOURS PRN FOR PAIN;  Therapy: 02Jul2014 to (Evaluate:01Aug2014); Last Rx:02Jul2014 8. PriLOSEC 40 MG Oral Capsule Delayed Release; Therapy: (Recorded:10Aug2011) to 9. TiZANidine HCl 4 MG Oral Tablet; Therapy: 13Dec2013 to 10. Uribel 118 MG Oral Capsule; TAKE 1 CAPSULE Twice daily PRN; Therapy: 30Jun2014 to   (Evaluate:25Jun2015)  Requested for: 30Jun2014; Last Rx:30Jun2014 11. ValACYclovir HCl 500 MG Oral Tablet; Therapy: 22Oct2013 to  Allergies Medication  1. Macrodantin CAPS  Family History Problems  1. Paternal history of  Anemia V18.2 2. Family history of  Family Health Status - Father's Age 26 3. Family history of  Family Health Status - Mother's Age 25 4. Family history of  Family Health Status Number Of Children 2 sons 22,18 5. Paternal history of  Heart Disease V17.49 6. Paternal history of  Hematuria 7. Paternal history of  Nephrectomy 8. Paternal history of  Nephrolithiasis 9. Paternal history of  Renal Cell Carcinoma V16.51  Social History Problems  1. Caffeine Use 1-2 per day 2. Marital History - Currently Married 3. 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 History of  Alcohol Use  Review of Systems Constitutional, cardiovascular, pulmonary and neurological system(s) were reviewed and pertinent findings if present are noted.    Vitals Vital Signs [Data Includes: Last 1 Day]  24Jul2014 12:02PM  Blood Pressure: 119 / 80 Temperature: 98.2 F Heart Rate: 81  Physical Exam Constitutional: Well nourished and well developed . No acute distress.   Pulmonary: No respiratory distress and normal respiratory rhythm and effort.  Cardiovascular: Heart rate and rhythm are normal . No peripheral edema.  Abdomen: No CVA tenderness.  Neuro/Psych:. Mood and affect are appropriate.    Results/Data Urine [Data Includes: Last 1 Day]   24Jul2014  COLOR GREEN   APPEARANCE CLOUDY   SPECIFIC GRAVITY 1.020   pH 5.5   GLUCOSE NEG mg/dL  BILIRUBIN NEG   KETONE NEG mg/dL  BLOOD LARGE   PROTEIN 30 mg/dL  UROBILINOGEN 0.2 mg/dL  NITRITE NEG   LEUKOCYTE ESTERASE SMALL   SQUAMOUS EPITHELIAL/HPF MODERATE   WBC 0-2 WBC/hpf  RBC 21-50 RBC/hpf  BACTERIA RARE   CRYSTALS NONE SEEN   CASTS NONE SEEN    Assessment Assessed  1. Nephrolithiasis 592.0  Plan Health Maintenance (V70.0)  1. UA With REFLEX  Done: 24Jul2014 11:35AM Nephrolithiasis (592.0)  2. Follow-up Schedule Surgery Office  Follow-up  Requested for: 24Jul2014  Discussion/Summary     Her postop course is complicated by residual stone fragments. I discussed the findings on the KUB with the patient. We discussed the nature risks and benefits of stent removal and stone passage or proceeding with ureteroscopy to evaluate the collecting system and remove any fragments. All questions answered. she elects to proceed with ureteroscopy. We discussed we may not leave a stent or may leave a stent with string.     Signatures Electronically signed by : Jerilee Field, M.D.; Oct 30 2012 11:25AM

## 2012-11-13 ENCOUNTER — Ambulatory Visit (HOSPITAL_COMMUNITY): Payer: 59 | Admitting: Anesthesiology

## 2012-11-13 ENCOUNTER — Encounter (HOSPITAL_COMMUNITY): Payer: Self-pay | Admitting: Anesthesiology

## 2012-11-13 ENCOUNTER — Encounter (HOSPITAL_COMMUNITY)
Admission: RE | Disposition: A | Discharge status: Home / Self Care | Payer: Self-pay | Source: Ambulatory Visit | Attending: Urology

## 2012-11-13 ENCOUNTER — Ambulatory Visit (HOSPITAL_COMMUNITY)
Admission: RE | Admit: 2012-11-13 | Discharge: 2012-11-13 | Disposition: A | Discharge status: Home / Self Care | Payer: 59 | Source: Ambulatory Visit | Attending: Urology | Admitting: Urology

## 2012-11-13 ENCOUNTER — Encounter (HOSPITAL_COMMUNITY): Payer: Self-pay | Admitting: *Deleted

## 2012-11-13 DIAGNOSIS — M47817 Spondylosis without myelopathy or radiculopathy, lumbosacral region: Secondary | ICD-10-CM | POA: Insufficient documentation

## 2012-11-13 DIAGNOSIS — Z79899 Other long term (current) drug therapy: Secondary | ICD-10-CM | POA: Insufficient documentation

## 2012-11-13 DIAGNOSIS — Z90710 Acquired absence of both cervix and uterus: Secondary | ICD-10-CM | POA: Insufficient documentation

## 2012-11-13 DIAGNOSIS — N200 Calculus of kidney: Secondary | ICD-10-CM | POA: Insufficient documentation

## 2012-11-13 HISTORY — DX: Calculus of kidney: N20.0

## 2012-11-13 HISTORY — PX: CYSTOSCOPY WITH URETEROSCOPY: SHX5123

## 2012-11-13 LAB — CBC
Platelets: 217 10*3/uL (ref 150–400)
RBC: 5.02 MIL/uL (ref 3.87–5.11)
RDW: 13 % (ref 11.5–15.5)
WBC: 5.8 10*3/uL (ref 4.0–10.5)

## 2012-11-13 SURGERY — CYSTOSCOPY WITH URETEROSCOPY
Anesthesia: General | Site: Bladder | Laterality: Right | Wound class: Clean Contaminated

## 2012-11-13 MED ORDER — BELLADONNA ALKALOIDS-OPIUM 16.2-60 MG RE SUPP
RECTAL | Status: AC
  Filled 2012-11-13: qty 1

## 2012-11-13 MED ORDER — MIDAZOLAM HCL 5 MG/5ML IJ SOLN
INTRAMUSCULAR | Status: DC | PRN
  Administered 2012-11-13: 2 mg via INTRAVENOUS

## 2012-11-13 MED ORDER — ONDANSETRON HCL 4 MG/2ML IJ SOLN
INTRAMUSCULAR | Status: DC | PRN
  Administered 2012-11-13: 4 mg via INTRAVENOUS

## 2012-11-13 MED ORDER — LIDOCAINE HCL 1 % IJ SOLN
INTRAMUSCULAR | Status: DC | PRN
  Administered 2012-11-13: 100 mg via INTRADERMAL

## 2012-11-13 MED ORDER — FENTANYL CITRATE 0.05 MG/ML IJ SOLN
25.0000 ug | INTRAMUSCULAR | Status: DC | PRN
  Administered 2012-11-13 (×2): 50 ug via INTRAVENOUS

## 2012-11-13 MED ORDER — BELLADONNA ALKALOIDS-OPIUM 16.2-60 MG RE SUPP
RECTAL | Status: DC | PRN
  Administered 2012-11-13: 1 via RECTAL

## 2012-11-13 MED ORDER — MEPERIDINE HCL 50 MG/ML IJ SOLN: 6.2500 mg | INTRAMUSCULAR | Status: DC | PRN

## 2012-11-13 MED ORDER — CEPHALEXIN 500 MG PO CAPS: 500.0000 mg | ORAL_CAPSULE | Freq: Two times a day (BID) | ORAL | Status: DC

## 2012-11-13 MED ORDER — PROMETHAZINE HCL 25 MG/ML IJ SOLN: 6.2500 mg | INTRAMUSCULAR | Status: DC | PRN

## 2012-11-13 MED ORDER — LACTATED RINGERS IV SOLN: INTRAVENOUS | Status: DC

## 2012-11-13 MED ORDER — FENTANYL CITRATE 0.05 MG/ML IJ SOLN
INTRAMUSCULAR | Status: DC | PRN
  Administered 2012-11-13 (×3): 25 ug via INTRAVENOUS
  Administered 2012-11-13: 50 ug via INTRAVENOUS
  Administered 2012-11-13: 25 ug via INTRAVENOUS
  Administered 2012-11-13: 50 ug via INTRAVENOUS

## 2012-11-13 MED ORDER — CEFAZOLIN SODIUM-DEXTROSE 2-3 GM-% IV SOLR
INTRAVENOUS | Status: AC
  Filled 2012-11-13: qty 50

## 2012-11-13 MED ORDER — IOHEXOL 300 MG/ML  SOLN
INTRAMUSCULAR | Status: AC
  Filled 2012-11-13: qty 1

## 2012-11-13 MED ORDER — FENTANYL CITRATE 0.05 MG/ML IJ SOLN
INTRAMUSCULAR | Status: AC
  Filled 2012-11-13: qty 2

## 2012-11-13 MED ORDER — PROPOFOL 10 MG/ML IV BOLUS
INTRAVENOUS | Status: DC | PRN
  Administered 2012-11-13: 200 mg via INTRAVENOUS

## 2012-11-13 MED ORDER — SODIUM CHLORIDE 0.9 % IR SOLN
Status: DC | PRN
  Administered 2012-11-13: 3000 mL via INTRAVESICAL

## 2012-11-13 MED ORDER — CEFAZOLIN SODIUM-DEXTROSE 2-3 GM-% IV SOLR
2.0000 g | INTRAVENOUS | Status: AC
  Administered 2012-11-13: 2 g via INTRAVENOUS

## 2012-11-13 MED ORDER — LACTATED RINGERS IV SOLN
INTRAVENOUS | Status: DC
  Administered 2012-11-13: 1000 mL via INTRAVENOUS
  Administered 2012-11-13: 14:00:00 via INTRAVENOUS

## 2012-11-13 SURGICAL SUPPLY — 18 items
BAG URO CATCHER STRL LF (DRAPE) ×2 IMPLANT
BASKET LASER NITINOL 1.9FR (BASKET) ×1 IMPLANT
BASKET ZERO TIP NITINOL 2.4FR (BASKET) ×1 IMPLANT
BSKT STON RTRVL 120 1.9FR (BASKET) ×1
BSKT STON RTRVL ZERO TP 2.4FR (BASKET) ×1
CATH INTERMIT  6FR 70CM (CATHETERS) ×2 IMPLANT
CLOTH BEACON ORANGE TIMEOUT ST (SAFETY) ×2 IMPLANT
DRAPE CAMERA CLOSED 9X96 (DRAPES) ×2 IMPLANT
GLOVE BIOGEL M STRL SZ7.5 (GLOVE) ×2 IMPLANT
GOWN STRL NON-REIN LRG LVL3 (GOWN DISPOSABLE) ×2 IMPLANT
GOWN STRL REIN XL XLG (GOWN DISPOSABLE) ×2 IMPLANT
GUIDEWIRE STR DUAL SENSOR (WIRE) ×2 IMPLANT
MANIFOLD NEPTUNE II (INSTRUMENTS) ×2 IMPLANT
PACK CYSTO (CUSTOM PROCEDURE TRAY) ×2 IMPLANT
SHEATH ACCESS URETERAL 24CM (SHEATH) ×1 IMPLANT
STENT CONTOUR 6FRX24X.038 (STENTS) ×2 IMPLANT
TUBING CONNECTING 10 (TUBING) ×2 IMPLANT
WIRE COONS/BENSON .038X145CM (WIRE) ×1 IMPLANT

## 2012-11-13 NOTE — Anesthesia Preprocedure Evaluation (Addendum)
Anesthesia Evaluation  Patient identified by MRN, date of birth, ID band Patient awake    Reviewed: Allergy & Precautions, H&P , NPO status , Patient's Chart, lab work & pertinent test results  Airway Mallampati: II TM Distance: >3 FB Neck ROM: Full    Dental no notable dental hx.    Pulmonary neg pulmonary ROS,  breath sounds clear to auscultation  Pulmonary exam normal       Cardiovascular negative cardio ROS  Rhythm:Regular Rate:Normal     Neuro/Psych negative neurological ROS  negative psych ROS   GI/Hepatic negative GI ROS, Neg liver ROS,   Endo/Other  negative endocrine ROS  Renal/GU negative Renal ROS  negative genitourinary   Musculoskeletal negative musculoskeletal ROS (+)   Abdominal   Peds negative pediatric ROS (+)  Hematology negative hematology ROS (+)   Anesthesia Other Findings   Reproductive/Obstetrics negative OB ROS                           Anesthesia Physical Anesthesia Plan  ASA: II  Anesthesia Plan: General   Post-op Pain Management:    Induction: Intravenous  Airway Management Planned: LMA  Additional Equipment:   Intra-op Plan:   Post-operative Plan: Extubation in OR  Informed Consent: I have reviewed the patients History and Physical, chart, labs and discussed the procedure including the risks, benefits and alternatives for the proposed anesthesia with the patient or authorized representative who has indicated his/her understanding and acceptance.   Dental advisory given  Plan Discussed with: CRNA  Anesthesia Plan Comments:         Anesthesia Quick Evaluation  

## 2012-11-13 NOTE — Transfer of Care (Signed)
Immediate Anesthesia Transfer of Care Note  Patient: Laura Mills  Procedure(s) Performed: Procedure(s): RIGHT URETEROSCOPY (Right)  Patient Location: PACU  Anesthesia Type:General  Level of Consciousness: awake, alert  and oriented  Airway & Oxygen Therapy: Patient Spontanous Breathing and Patient connected to face mask oxygen  Post-op Assessment: Report given to PACU RN, Post -op Vital signs reviewed and stable and Patient moving all extremities X 4  Post vital signs: Reviewed and stable  Complications: No apparent anesthesia complications

## 2012-11-13 NOTE — Interval H&P Note (Signed)
History and Physical Interval Note:  11/13/2012 12:03 PM  Laura Mills  has presented today for surgery, with the diagnosis of Nephrolithiasis  The various methods of treatment have been discussed with the patient and family. After consideration of risks, benefits and other options for treatment, the patient has consented to  Procedure(s): RIGHT URETEROSCOPY (Right) as a surgical intervention .  The patient's history has been reviewed, patient examined, no change in status, stable for surgery.  I have reviewed the patient's chart and labs. I discussed with the patient the nature, potential benefits, risks and alternatives to right Ureteroscopy, including side effects of the proposed treatment, the likelihood of the patient achieving the goals of the procedure, and any potential problems that might occur during the procedure or recuperation. All questions answered. Patient elects to proceed. We discussed we may or may not leave a stent.     Antony Haste

## 2012-11-13 NOTE — Anesthesia Postprocedure Evaluation (Signed)
  Anesthesia Post-op Note  Patient: Laura Mills  Procedure(s) Performed: Procedure(s) (LRB): RIGHT URETEROSCOPY (Right)  Patient Location: PACU  Anesthesia Type: General  Level of Consciousness: awake and alert   Airway and Oxygen Therapy: Patient Spontanous Breathing  Post-op Pain: mild  Post-op Assessment: Post-op Vital signs reviewed, Patient's Cardiovascular Status Stable, Respiratory Function Stable, Patent Airway and No signs of Nausea or vomiting  Last Vitals:  Filed Vitals:   11/13/12 1415  BP: 129/69  Pulse: 77  Temp:   Resp: 17    Post-op Vital Signs: stable   Complications: No apparent anesthesia complications

## 2012-11-13 NOTE — Op Note (Signed)
Preop diagnosis: Right nephrolithiasis Postop diagnosis: Right nephrolithiasis  Procedure: Right ureteroscopy, stone basket extraction, stent placement with tether/string  Surgeon: Mena Goes  Type of anesthesia: Gen.   Findings:  On right ureteroscopy multiple stone fragments were noted and removed intact. There may been a few small stone fragments less than a millimeter in size.   On scout imaging there may have been some stones in the proximal ureter adjacent to the stent, no obvious stones were noted around the renal shadow, right.  Description of procedure:  After consent was obtained patient brought to the operating room. After adequate anesthesia she was placed in lithotomy position and prepped and draped in the usual fashion. A timeout was performed to confirm the patient and procedure. A cystoscope was passed per urethra and the right ureteral stent was grasped and removed through the urethral meatus through which a sensor wire was advanced and coiled in the collecting system. Adjacent to the wire a rigid ureteroscope was passed. The ureter appeared clear all the way up into the proximal ureter. Under direct visualization a second wire was passed. The rigid scope was removed leaving a second wire in place. In a short access sheath was advanced without difficulty into the proximal ureter. Using the ureteroscope the upper middle and lower pole calyces were carefully inspected. 3 fragments were removed from the lower pole with a Nitinol 0 tip basket and no other stone fragments were noted. The middle pole collecting system was clear. The upper pole infundibulum contained multiple stone fragments which were pushed into the upper pole collecting system and basket sequentially. I used the Nitinol 0 tip as well as the escape basket. No other clinically significant fragments were noted. There were 3 less than 1 mm fragments that could not be ensnared with the basket after multiple attempts. I then  reinspected the entire collecting system again noted no other significant fragments. The access sheath was backed out on the ureteroscope and the ureter carefully inspected in its entirety and noted to be free of stone fragments, perforation or injury. There was some expected mild mucosal bleeding from where the sheath was placed  , therefore I decided to leave a stent with a string. The wire was backloaded on the cystoscope and a 6 x 24 cm ureteral stent was advanced. The wire was removed and a good coil was seen in the upper pole collecting system and a good coil in the bladder. The patient's bladder was drained and the scope removed. She was awakened and taken to the recovery room in stable condition.   Specimens: Stone fragments given the patient  Drains: 6 x 24 cm right ureteral stent with string  Complications: None  Blood loss: Minimal  Disposition: Patient stable to PACU

## 2012-11-13 NOTE — Preoperative (Signed)
Beta Blockers   Reason not to administer Beta Blockers:Not Applicable 

## 2012-11-16 ENCOUNTER — Encounter (HOSPITAL_COMMUNITY): Payer: Self-pay | Admitting: Urology

## 2013-01-13 ENCOUNTER — Encounter: Payer: Self-pay | Admitting: Gastroenterology

## 2013-01-14 ENCOUNTER — Other Ambulatory Visit: Payer: Self-pay | Admitting: Orthopaedic Surgery

## 2013-01-14 DIAGNOSIS — M545 Low back pain: Secondary | ICD-10-CM

## 2013-01-20 ENCOUNTER — Ambulatory Visit
Admission: RE | Admit: 2013-01-20 | Discharge: 2013-01-20 | Disposition: A | Discharge status: Home / Self Care | Payer: 59 | Source: Ambulatory Visit | Attending: Orthopaedic Surgery | Admitting: Orthopaedic Surgery

## 2013-01-20 DIAGNOSIS — M545 Low back pain: Secondary | ICD-10-CM

## 2013-03-08 ENCOUNTER — Ambulatory Visit (AMBULATORY_SURGERY_CENTER): Payer: Self-pay | Admitting: *Deleted

## 2013-03-08 VITALS — Ht 62.0 in | Wt 168.0 lb

## 2013-03-08 DIAGNOSIS — Z1211 Encounter for screening for malignant neoplasm of colon: Secondary | ICD-10-CM

## 2013-03-08 MED ORDER — MOVIPREP 100 G PO SOLR: 1.0000 | Freq: Once | ORAL | Status: DC

## 2013-03-08 NOTE — Progress Notes (Signed)
Denies allergies to eggs or soy products. Denies complications with sedation or anesthesia. 

## 2013-03-11 ENCOUNTER — Encounter: Payer: Self-pay | Admitting: Gastroenterology

## 2013-03-22 ENCOUNTER — Encounter: Payer: Self-pay | Admitting: Gastroenterology

## 2013-03-22 ENCOUNTER — Ambulatory Visit (AMBULATORY_SURGERY_CENTER): Payer: 59 | Admitting: Gastroenterology

## 2013-03-22 VITALS — BP 116/77 | HR 84 | Temp 97.4°F | Resp 24 | Ht 62.0 in | Wt 168.0 lb

## 2013-03-22 DIAGNOSIS — Z1211 Encounter for screening for malignant neoplasm of colon: Secondary | ICD-10-CM

## 2013-03-22 MED ORDER — SODIUM CHLORIDE 0.9 % IV SOLN: 500.0000 mL | INTRAVENOUS | Status: DC

## 2013-03-22 NOTE — Progress Notes (Signed)
Patient did not experience any of the following events: a burn prior to discharge; a fall within the facility; wrong site/side/patient/procedure/implant event; or a hospital transfer or hospital admission upon discharge from the facility. (G8907)Patient did not have preoperative order for IV antibiotic SSI prophylaxis. (G8918) ewm 

## 2013-03-22 NOTE — Op Note (Signed)
[facility] [address] [place], [address]   COLONOSCOPY PROCEDURE REPORT  PATIENT: Laura, Mills  MR#: [id number] BIRTHDATE: [birth year] , 52  yrs. old GENDER: Female ENDOSCOPIST: Rachael Fee, MD REFERRED XB:JYNWGN Ehinger, M.D. PROCEDURE DATE:  03/22/2013 PROCEDURE:   Colonoscopy, screening First Screening Colonoscopy - Avg.  risk and is 50 yrs.  old or older Yes.  Prior Negative Screening - Now for repeat screening. N/A  History of Adenoma - Now for follow-up colonoscopy & has been > or = to 3 yrs.  N/A  Polyps Removed Today? No.  Recommend repeat exam, <10 yrs? No. ASA CLASS:   Class II INDICATIONS:average risk screening. MEDICATIONS: Fentanyl 100 mcg IV, Versed 9 mg IV, and These medications were titrated to patient response per physician's verbal order  DESCRIPTION OF PROCEDURE:   After the risks benefits and alternatives of the procedure were thoroughly explained, informed consent was obtained.  A digital rectal exam revealed no abnormalities of the rectum.   The LB [id number]  endoscope was introduced through the anus and advanced to the cecum, which was identified by both the appendix and ileocecal valve. No adverse events experienced.   The quality of the prep was excellent.  The instrument was then slowly withdrawn as the colon was fully examined.   COLON FINDINGS: There were a few small diverticulum in left colon. The examination was otherwise normal.  Retroflexed views revealed no abnormalities. The time to cecum=4 minutes 34 seconds. Withdrawal time=7 minutes 50 seconds.  The scope was withdrawn and the procedure completed. COMPLICATIONS: There were no complications.  ENDOSCOPIC IMPRESSION: There were a few small diverticulum in left colon. The examination was otherwise normal.  No polyps or cancers.  RECOMMENDATIONS: You should continue to follow colorectal cancer screening guidelines for "routine risk" patients with a repeat  colonoscopy in 10 years.    eSigned:  Rachael Fee, MD 03/22/2013 10:11 AM

## 2013-03-22 NOTE — Patient Instructions (Signed)

## 2013-03-23 ENCOUNTER — Telehealth: Payer: Self-pay | Admitting: *Deleted

## 2013-03-23 NOTE — Telephone Encounter (Signed)
  Follow up Call-  Call back number 03/22/2013  Post procedure Call Back phone  # [phone]  Permission to leave phone message Yes     Patient questions:  Do you have a fever, pain , or abdominal swelling? no Pain Score  0 *  Have you tolerated food without any problems? yes  Have you been able to return to your normal activities? yes  Do you have any questions about your discharge instructions: Diet   no Medications  no Follow up visit  no  Do you have questions or concerns about your Care? no  Actions: * If pain score is 4 or above: No action needed, pain <4.

## 2013-11-19 ENCOUNTER — Other Ambulatory Visit: Payer: Self-pay | Admitting: Orthopaedic Surgery

## 2013-11-19 DIAGNOSIS — M25512 Pain in left shoulder: Secondary | ICD-10-CM

## 2013-11-30 ENCOUNTER — Ambulatory Visit
Admission: RE | Admit: 2013-11-30 | Discharge: 2013-11-30 | Disposition: A | Discharge status: Home / Self Care | Payer: 59 | Source: Ambulatory Visit | Attending: Orthopaedic Surgery | Admitting: Orthopaedic Surgery

## 2013-11-30 DIAGNOSIS — M25512 Pain in left shoulder: Secondary | ICD-10-CM

## 2013-12-05 ENCOUNTER — Ambulatory Visit
Admission: RE | Admit: 2013-12-05 | Discharge: 2013-12-05 | Disposition: A | Discharge status: Home / Self Care | Payer: 59 | Source: Ambulatory Visit | Attending: Orthopaedic Surgery | Admitting: Orthopaedic Surgery

## 2014-04-06 ENCOUNTER — Other Ambulatory Visit: Payer: Self-pay | Admitting: Obstetrics and Gynecology

## 2014-04-07 LAB — CYTOLOGY - PAP

## 2016-01-09 ENCOUNTER — Other Ambulatory Visit: Payer: Self-pay | Admitting: Physical Medicine and Rehabilitation

## 2016-01-09 DIAGNOSIS — M545 Low back pain: Secondary | ICD-10-CM

## 2016-01-09 DIAGNOSIS — M47816 Spondylosis without myelopathy or radiculopathy, lumbar region: Secondary | ICD-10-CM

## 2016-01-16 ENCOUNTER — Other Ambulatory Visit: Payer: Self-pay

## 2016-01-16 ENCOUNTER — Ambulatory Visit
Admission: RE | Admit: 2016-01-16 | Discharge: 2016-01-16 | Disposition: A | Discharge status: Home / Self Care | Payer: 59 | Source: Ambulatory Visit | Attending: Physical Medicine and Rehabilitation | Admitting: Physical Medicine and Rehabilitation

## 2016-01-16 DIAGNOSIS — M47816 Spondylosis without myelopathy or radiculopathy, lumbar region: Secondary | ICD-10-CM

## 2016-01-16 DIAGNOSIS — M545 Low back pain: Secondary | ICD-10-CM

## 2016-04-23 DIAGNOSIS — Z01419 Encounter for gynecological examination (general) (routine) without abnormal findings: Secondary | ICD-10-CM | POA: Diagnosis not present

## 2016-05-13 DIAGNOSIS — H2511 Age-related nuclear cataract, right eye: Secondary | ICD-10-CM | POA: Diagnosis not present

## 2016-05-13 DIAGNOSIS — H2589 Other age-related cataract: Secondary | ICD-10-CM | POA: Diagnosis not present

## 2016-05-14 DIAGNOSIS — H2512 Age-related nuclear cataract, left eye: Secondary | ICD-10-CM | POA: Diagnosis not present

## 2016-05-27 DIAGNOSIS — H2512 Age-related nuclear cataract, left eye: Secondary | ICD-10-CM | POA: Diagnosis not present

## 2016-07-23 DIAGNOSIS — J3089 Other allergic rhinitis: Secondary | ICD-10-CM | POA: Diagnosis not present

## 2016-10-02 DIAGNOSIS — D225 Melanocytic nevi of trunk: Secondary | ICD-10-CM | POA: Diagnosis not present

## 2016-10-02 DIAGNOSIS — L814 Other melanin hyperpigmentation: Secondary | ICD-10-CM | POA: Diagnosis not present

## 2016-10-02 DIAGNOSIS — D1801 Hemangioma of skin and subcutaneous tissue: Secondary | ICD-10-CM | POA: Diagnosis not present

## 2016-11-26 DIAGNOSIS — H66004 Acute suppurative otitis media without spontaneous rupture of ear drum, recurrent, right ear: Secondary | ICD-10-CM | POA: Diagnosis not present

## 2016-11-26 DIAGNOSIS — M47816 Spondylosis without myelopathy or radiculopathy, lumbar region: Secondary | ICD-10-CM | POA: Diagnosis not present

## 2016-11-26 DIAGNOSIS — M545 Low back pain: Secondary | ICD-10-CM | POA: Diagnosis not present

## 2016-11-26 DIAGNOSIS — H9201 Otalgia, right ear: Secondary | ICD-10-CM | POA: Diagnosis not present

## 2016-11-26 DIAGNOSIS — H60331 Swimmer's ear, right ear: Secondary | ICD-10-CM | POA: Diagnosis not present

## 2016-11-26 DIAGNOSIS — M7071 Other bursitis of hip, right hip: Secondary | ICD-10-CM | POA: Diagnosis not present

## 2016-12-16 ENCOUNTER — Emergency Department (HOSPITAL_BASED_OUTPATIENT_CLINIC_OR_DEPARTMENT_OTHER): Payer: 59

## 2016-12-16 ENCOUNTER — Encounter (HOSPITAL_BASED_OUTPATIENT_CLINIC_OR_DEPARTMENT_OTHER): Payer: Self-pay

## 2016-12-16 ENCOUNTER — Emergency Department (HOSPITAL_BASED_OUTPATIENT_CLINIC_OR_DEPARTMENT_OTHER)
Admission: EM | Admit: 2016-12-16 | Discharge: 2016-12-16 | Disposition: A | Discharge status: Home / Self Care | Payer: 59 | Attending: Emergency Medicine | Admitting: Emergency Medicine

## 2016-12-16 DIAGNOSIS — R109 Unspecified abdominal pain: Secondary | ICD-10-CM | POA: Diagnosis not present

## 2016-12-16 DIAGNOSIS — Z79899 Other long term (current) drug therapy: Secondary | ICD-10-CM | POA: Insufficient documentation

## 2016-12-16 DIAGNOSIS — R1012 Left upper quadrant pain: Secondary | ICD-10-CM | POA: Diagnosis not present

## 2016-12-16 DIAGNOSIS — M791 Myalgia: Secondary | ICD-10-CM | POA: Insufficient documentation

## 2016-12-16 DIAGNOSIS — R0789 Other chest pain: Secondary | ICD-10-CM

## 2016-12-16 DIAGNOSIS — R0781 Pleurodynia: Secondary | ICD-10-CM | POA: Diagnosis not present

## 2016-12-16 DIAGNOSIS — K5730 Diverticulosis of large intestine without perforation or abscess without bleeding: Secondary | ICD-10-CM | POA: Diagnosis not present

## 2016-12-16 LAB — BASIC METABOLIC PANEL
ANION GAP: 9 (ref 5–15)
BUN: 14 mg/dL (ref 6–20)
CALCIUM: 9 mg/dL (ref 8.9–10.3)
CHLORIDE: 107 mmol/L (ref 101–111)
CO2: 23 mmol/L (ref 22–32)
Creatinine, Ser: 0.43 mg/dL — ABNORMAL LOW (ref 0.44–1.00)
GFR calc non Af Amer: >60 mL/min (ref >60)
Glucose, Bld: 94 mg/dL (ref 65–99)
Potassium: 3.6 mmol/L (ref 3.5–5.1)
Sodium: 139 mmol/L (ref 135–145)

## 2016-12-16 LAB — URINALYSIS, MICROSCOPIC (REFLEX)

## 2016-12-16 LAB — URINALYSIS, ROUTINE W REFLEX MICROSCOPIC
BILIRUBIN URINE: NEGATIVE
Glucose, UA: NEGATIVE mg/dL
KETONES UR: NEGATIVE mg/dL
Leukocytes, UA: NEGATIVE
NITRITE: NEGATIVE
PH: 5.5 (ref 5.0–8.0)
PROTEIN: NEGATIVE mg/dL
Specific Gravity, Urine: 1.025 (ref 1.005–1.030)

## 2016-12-16 LAB — CBC WITH DIFFERENTIAL/PLATELET
Basophils Absolute: 0 10*3/uL (ref 0.0–0.1)
Basophils Relative: 1 %
EOS PCT: 2 %
Eosinophils Absolute: 0.1 10*3/uL (ref 0.0–0.7)
HEMATOCRIT: 40.6 % (ref 36.0–46.0)
Hemoglobin: 14 g/dL (ref 12.0–15.0)
LYMPHS PCT: 44 %
Lymphs Abs: 2.2 10*3/uL (ref 0.7–4.0)
MCH: 28.8 pg (ref 26.0–34.0)
MCHC: 34.5 g/dL (ref 30.0–36.0)
MCV: 83.5 fL (ref 78.0–100.0)
MONO ABS: 0.4 10*3/uL (ref 0.1–1.0)
MONOS PCT: 8 %
NEUTROS ABS: 2.3 10*3/uL (ref 1.7–7.7)
Neutrophils Relative %: 45 %
Platelets: 186 10*3/uL (ref 150–400)
RBC: 4.86 MIL/uL (ref 3.87–5.11)
RDW: 13.3 % (ref 11.5–15.5)
WBC: 5 10*3/uL (ref 4.0–10.5)

## 2016-12-16 MED ORDER — KETOROLAC TROMETHAMINE 60 MG/2ML IM SOLN
60.0000 mg | Freq: Once | INTRAMUSCULAR | Status: AC
  Administered 2016-12-16: 60 mg via INTRAMUSCULAR
  Filled 2016-12-16: qty 2

## 2016-12-16 MED ORDER — IOPAMIDOL (ISOVUE-370) INJECTION 76%
100.0000 mL | Freq: Once | INTRAVENOUS | Status: AC | PRN
  Administered 2016-12-16: 100 mL via INTRAVENOUS

## 2016-12-16 NOTE — ED Notes (Signed)
Patient transported to CT 

## 2016-12-16 NOTE — ED Provider Notes (Signed)
[facility]PT [facility] Provider Note   CSN: [id number] Arrival date & time: 12/16/16  1057     History   Chief Complaint Chief Complaint  Patient presents with  . Flank Pain    HPI Laura Mills is a 56 y.o. female.  Patient is a 49 roll female with a history of kidney stones and hypertension presenting today with severe left-sided chest pain. Patient states approximately 2 weeks ago she woke up in the middle of the night coughing severely and developed a severe pain in the left side of her ribs after that time. She had waxing and waning pain since then and then Saturday the pain became worse. She cannot recall any injury, new cough lifting or any other reason why the pain would become worse on Saturday but it has not improved. She has been taking her own prescribed Robaxin and tramadol at home which helped with the pain but did not relieve it. She denies shortness of breath, abdominal pain, nausea, vomiting, urinary or bowel changes. She states this does not feel like kidney stone pain. She did notice some bruising on that side of her chest where the pain was initially when it happened which is now almost resolved.   The history is provided by the patient.  Chest Pain   This is a new problem. Episode onset: 2 weeks ago. The problem occurs constantly. The problem has been rapidly worsening. The pain is associated with coughing. The pain is present in the lateral region. The pain is at a severity of 8/10. The pain is severe. The quality of the pain is described as pressure-like, sharp and pleuritic. The pain does not radiate. The symptoms are aggravated by certain positions and deep breathing. Associated symptoms include back pain. Pertinent negatives include no cough, no diaphoresis, no fever, no headaches, no irregular heartbeat, no nausea, no shortness of breath, no sputum production and no vomiting. She has tried rest for the symptoms. The treatment provided no relief.  Her past medical  history is significant for hypertension.    Past Medical History:  Diagnosis Date  . Arthritis    LUMBAR SPINE AND RIGHT HIP  . GERD (gastroesophageal reflux disease)   . History of kidney stones   . History of kidney stones 10/2012  . Renal stone   . TMJ syndrome    LEFT     There are no active problems to display for this patient.   Past Surgical History:  Procedure Laterality Date  . ABDOMINAL HYSTERECTOMY    . CHOLECYSTECTOMY    . CHOLECYSTECTOMY  2009  . CYSTOSCOPY WITH URETEROSCOPY Right 10/02/2012   Procedure: CYSTOSCOPY WITH RIGHT RETROGRADE AND STENT PLACEMENT ;  Surgeon: Fredricka Bonine, MD;  Location: [facility];  Service: Urology;  Laterality: Right;  . CYSTOSCOPY WITH URETEROSCOPY Right 11/13/2012   Procedure: RIGHT URETEROSCOPY;  Surgeon: Fredricka Bonine, MD;  Location: [facility];  Service: Urology;  Laterality: Right;  with Stent  . LITHOTRIPSY  10/2012  . URETERAL STENT PLACEMENT      OB History    No data available       Home Medications    Prior to Admission medications   Medication Sig Start Date End Date Taking? Authorizing Provider  calcium-vitamin D (OSCAL WITH D) 500-200 MG-UNIT per tablet Take 1 tablet by mouth 2 (two) times daily.    [provider]  cetirizine (ZYRTEC) 10 MG tablet Take 10 mg by mouth every evening.     [provider]  estradiol (ESTRACE) 0.1 MG/GM vaginal cream Place 2 g vaginally 2 (two) times a week. She uses on Monday and Thursday.    [provider]  estradiol (ESTRACE) 2 MG tablet Take 2 mg by mouth at bedtime.    [provider]  fluticasone Asencion Islam) 50 MCG/ACT nasal spray  01/25/13   [provider]  HYDROcodone-acetaminophen (NORCO/VICODIN) 5-325 MG per tablet  01/15/13   [provider]  meloxicam (MOBIC) 7.5 MG tablet Take 7.5 mg by mouth 2 (two) times daily as needed for pain.     [provider]  methocarbamol (ROBAXIN) 750 MG tablet Take  750-1,500 mg by mouth every 6 (six) hours as needed (For muscle spasms.  DOES NOT TAKE METHOCARBAMOL IF TAKING THE FLEXERIL).     [provider]  methylPREDNIsolone (MEDROL DOSPACK) 4 MG tablet  01/20/13   [provider]  Multiple Vitamin (MULTIVITAMIN WITH MINERALS) TABS Take 1 tablet by mouth every morning.    [provider]  omeprazole (PRILOSEC) 20 MG capsule Take 20 mg by mouth daily after supper.     [provider]  oxyCODONE-acetaminophen (PERCOCET/ROXICET) 5-325 MG per tablet Take 1 tablet by mouth every 4 (four) hours as needed for pain. 10/02/12   Festus Aloe, MD  solifenacin (VESICARE) 10 MG tablet Take 10 mg by mouth daily.    [provider]  valACYclovir (VALTREX) 500 MG tablet Take 500 mg by mouth at bedtime.     [provider]    Family History Family History  Problem Relation Age of Onset  . Colon cancer Neg Hx   . Esophageal cancer Neg Hx   . Rectal cancer Neg Hx   . Stomach cancer Neg Hx     Social History Social History  Substance Use Topics  . Smoking status: Never Smoker  . Smokeless tobacco: Never Used  . Alcohol use No     Allergies   Ciprofloxacin; Macrodantin [nitrofurantoin]; and Minocin [minocycline hcl]   Review of Systems Review of Systems  Constitutional: Negative for diaphoresis and fever.  Respiratory: Negative for cough, sputum production and shortness of breath.   Cardiovascular: Positive for chest pain.  Gastrointestinal: Negative for nausea and vomiting.  Musculoskeletal: Positive for back pain.  Neurological: Negative for headaches.  All other systems reviewed and are negative.    Physical Exam Updated Vital Signs BP (!) 146/94 (BP Location: Left Arm)   Pulse (!) 103   Temp 98 F (36.7 C) (Oral)   Resp 16   Ht 5\' 2"  (1.575 m)   Wt 70.8 kg (156 lb)   SpO2 99%   BMI 28.53 kg/m   Physical Exam  Constitutional: She is oriented to person, place, and time. She  appears well-developed and well-nourished. No distress.  HENT:  Head: Normocephalic and atraumatic.  Mouth/Throat: Oropharynx is clear and moist.  Eyes: Pupils are equal, round, and reactive to light. Conjunctivae and EOM are normal.  Neck: Normal range of motion. Neck supple.  Cardiovascular: Regular rhythm and intact distal pulses.  Tachycardia present.   No murmur heard. Pulmonary/Chest: Effort normal and breath sounds normal. No respiratory distress. She has no wheezes. She has no rales. She exhibits tenderness.    Abdominal: Soft. She exhibits no distension. There is no tenderness. There is no rebound and no guarding.  Musculoskeletal: Normal range of motion. She exhibits no edema or tenderness.  Neurological: She is alert and oriented to person, place, and time.  Skin: Skin is warm  and dry. No rash noted. No erythema.  Psychiatric: She has a normal mood and affect. Her behavior is normal.  Nursing note and vitals reviewed.    ED Treatments / Results  Labs (all labs ordered are listed, but only abnormal results are displayed) Labs Reviewed  URINALYSIS, ROUTINE W REFLEX MICROSCOPIC - Abnormal; Notable for the following:       Result Value   Hgb urine dipstick TRACE (*)    All other components within normal limits  URINALYSIS, MICROSCOPIC (REFLEX) - Abnormal; Notable for the following:    Bacteria, UA MANY (*)    Squamous Epithelial / LPF 6-30 (*)    All other components within normal limits  CBC WITH DIFFERENTIAL/PLATELET  BASIC METABOLIC PANEL    EKG  EKG Interpretation None       Radiology Dg Ribs Unilateral W/chest Left  Result Date: 12/16/2016 CLINICAL DATA:  Left anterolateral lower rib pain. EXAM: LEFT RIBS AND CHEST - 3+ VIEW COMPARISON:  Chest x-ray dated May 25, 2008. FINDINGS: No fracture or other bone lesions are seen involving the ribs. There is no evidence of pneumothorax or pleural effusion. Both lungs are clear. Heart size and mediastinal contours  are within normal limits. Prior cholecystectomy. IMPRESSION: Negative. Electronically Signed   By: Titus Dubin M.D.   On: 12/16/2016 12:10   Ct Angio Chest Aorta W And/or Wo Contrast  Result Date: 12/16/2016 CLINICAL DATA:  Left flank pain.  Concern for aortic disease. EXAM: CT ANGIOGRAPHY CHEST, ABDOMEN AND PELVIS TECHNIQUE: Multidetector CT imaging through the chest, abdomen and pelvis was performed using the standard protocol during bolus administration of intravenous contrast. Multiplanar reconstructed images and MIPs were obtained and reviewed to evaluate the vascular anatomy. CONTRAST:  100 cc Isovue 370 IV COMPARISON:  None FINDINGS: CTA CHEST FINDINGS Cardiovascular: Heart is normal size. Aorta is normal caliber. No evidence of aortic dissection. Mediastinum/Nodes: No mediastinal, hilar, or axillary adenopathy. Trachea and esophagus are unremarkable. Lungs/Pleura: Lungs are clear. No focal airspace opacities or suspicious nodules. No effusions. Musculoskeletal: No acute bony abnormality. Review of the MIP images confirms the above findings. CTA ABDOMEN AND PELVIS FINDINGS VASCULAR Aorta: Normal caliber.  No dissection. Celiac: Widely patent SMA: Widely patent Renals: Single bilaterally, widely patent IMA: Widely patent Inflow: Widely patent Veins: Grossly unremarkable. Review of the MIP images confirms the above findings. NON-VASCULAR Hepatobiliary: No focal liver abnormality is seen. Status post cholecystectomy. No biliary dilatation. Suspicion for fatty infiltration of the liver. Pancreas: No focal abnormality or ductal dilatation. Spleen: No focal abnormality.  Normal size. Adrenals/Urinary Tract: No adrenal abnormality. No focal renal abnormality. No stones or hydronephrosis. Urinary bladder is unremarkable. Stomach/Bowel: Diffuse sigmoid diverticulosis. No active diverticulitis. Appendix is normal. Stomach and small bowel unremarkable. Lymphatic: No adenopathy. Reproductive: Prior hysterectomy.   No adnexal masses. Other: No free fluid or free air. Musculoskeletal: No acute bony abnormality. Review of the MIP images confirms the above findings. IMPRESSION: No evidence of aortic aneurysm or dissection. No evidence of pulmonary embolus. No acute cardiopulmonary disease. Suspect mild fatty infiltration of the liver. Prior cholecystectomy and hysterectomy. Sigmoid diverticulosis.  No active diverticulitis. No acute findings in the chest, abdomen or pelvis. Electronically Signed   By: Rolm Baptise M.D.   On: 12/16/2016 13:08   Ct Angio Abd/pel W And/or Wo Contrast  Result Date: 12/16/2016 CLINICAL DATA:  Left flank pain.  Concern for aortic disease. EXAM: CT ANGIOGRAPHY CHEST, ABDOMEN AND PELVIS TECHNIQUE: Multidetector CT imaging through the chest, abdomen and  pelvis was performed using the standard protocol during bolus administration of intravenous contrast. Multiplanar reconstructed images and MIPs were obtained and reviewed to evaluate the vascular anatomy. CONTRAST:  100 cc Isovue 370 IV COMPARISON:  None FINDINGS: CTA CHEST FINDINGS Cardiovascular: Heart is normal size. Aorta is normal caliber. No evidence of aortic dissection. Mediastinum/Nodes: No mediastinal, hilar, or axillary adenopathy. Trachea and esophagus are unremarkable. Lungs/Pleura: Lungs are clear. No focal airspace opacities or suspicious nodules. No effusions. Musculoskeletal: No acute bony abnormality. Review of the MIP images confirms the above findings. CTA ABDOMEN AND PELVIS FINDINGS VASCULAR Aorta: Normal caliber.  No dissection. Celiac: Widely patent SMA: Widely patent Renals: Single bilaterally, widely patent IMA: Widely patent Inflow: Widely patent Veins: Grossly unremarkable. Review of the MIP images confirms the above findings. NON-VASCULAR Hepatobiliary: No focal liver abnormality is seen. Status post cholecystectomy. No biliary dilatation. Suspicion for fatty infiltration of the liver. Pancreas: No focal abnormality or  ductal dilatation. Spleen: No focal abnormality.  Normal size. Adrenals/Urinary Tract: No adrenal abnormality. No focal renal abnormality. No stones or hydronephrosis. Urinary bladder is unremarkable. Stomach/Bowel: Diffuse sigmoid diverticulosis. No active diverticulitis. Appendix is normal. Stomach and small bowel unremarkable. Lymphatic: No adenopathy. Reproductive: Prior hysterectomy.  No adnexal masses. Other: No free fluid or free air. Musculoskeletal: No acute bony abnormality. Review of the MIP images confirms the above findings. IMPRESSION: No evidence of aortic aneurysm or dissection. No evidence of pulmonary embolus. No acute cardiopulmonary disease. Suspect mild fatty infiltration of the liver. Prior cholecystectomy and hysterectomy. Sigmoid diverticulosis.  No active diverticulitis. No acute findings in the chest, abdomen or pelvis. Electronically Signed   By: Rolm Baptise M.D.   On: 12/16/2016 13:08    Procedures Procedures (including critical care time)  Medications Ordered in ED Medications  ketorolac (TORADOL) injection 60 mg (not administered)     Initial Impression / Assessment and Plan / ED Course  I have reviewed the triage vital signs and the nursing notes.  Pertinent labs & imaging results that were available during my care of the patient were reviewed by me and considered in my medical decision making (see chart for details).    Patient presenting with left-sided chest pain that seems most likely related to rib fractures after a coughing spell.  No radiation of pain or GI symptoms. Low suspicion for kidney stone or bowel pathology at this time. Lower suspicion for PE given the abrupt nature and history. However patient does take hormones and if x-ray is negative for rib fractures will do a CT to rule out PE or dissection. She is currently hemodynamically stable.  12:40 PM Plain images negative for rib fractures. Will do CT dissection protocol to rule out aortic dissection  as patient states initially the pain was so severe it felt like her heart was going to blow out the back of her back and chest even though she has not had pain like that since. Given patient is on hormone therapy this will also help to rule out PE. May be occult rib fractures as well. Patient remained stable  3:32 PM CTA neg for acute pathology.  Must be muscular in nature.  Findings discussed with pt and her husband and encouraged to continue what she is already doing at home.  Final Clinical Impressions(s) / ED Diagnoses   Final diagnoses:  Musculoskeletal chest pain    New Prescriptions New Prescriptions   No medications on file     Blanchie Dessert, MD 12/16/16 1533

## 2016-12-16 NOTE — Discharge Instructions (Signed)
Continue tramadol, muscle relaxer and ibuprofen as needed for muscle strain. Also can use lidocaine patches

## 2016-12-16 NOTE — ED Triage Notes (Signed)
Left side pain x 2 weeks worsening since Saturday. Denies urinary symptoms. Sts sharp and dull pain. Denies nausea and vomiting.

## 2016-12-19 DIAGNOSIS — H6981 Other specified disorders of Eustachian tube, right ear: Secondary | ICD-10-CM | POA: Diagnosis not present

## 2016-12-25 ENCOUNTER — Other Ambulatory Visit: Payer: Self-pay | Admitting: Otolaryngology

## 2016-12-25 DIAGNOSIS — H6981 Other specified disorders of Eustachian tube, right ear: Secondary | ICD-10-CM

## 2016-12-25 DIAGNOSIS — H60331 Swimmer's ear, right ear: Secondary | ICD-10-CM

## 2017-01-03 ENCOUNTER — Ambulatory Visit
Admission: RE | Admit: 2017-01-03 | Discharge: 2017-01-03 | Disposition: A | Discharge status: Home / Self Care | Payer: 59 | Source: Ambulatory Visit | Attending: Otolaryngology | Admitting: Otolaryngology

## 2017-01-03 DIAGNOSIS — H60331 Swimmer's ear, right ear: Secondary | ICD-10-CM

## 2017-01-03 DIAGNOSIS — H6981 Other specified disorders of Eustachian tube, right ear: Secondary | ICD-10-CM

## 2017-01-03 DIAGNOSIS — H6991 Unspecified Eustachian tube disorder, right ear: Secondary | ICD-10-CM | POA: Diagnosis not present

## 2017-01-03 MED ORDER — IOPAMIDOL (ISOVUE-300) INJECTION 61%
75.0000 mL | Freq: Once | INTRAVENOUS | Status: AC | PRN
  Administered 2017-01-03: 75 mL via INTRAVENOUS

## 2017-01-07 ENCOUNTER — Other Ambulatory Visit: Payer: 59

## 2017-01-15 DIAGNOSIS — E7800 Pure hypercholesterolemia, unspecified: Secondary | ICD-10-CM | POA: Diagnosis not present

## 2017-01-15 DIAGNOSIS — I10 Essential (primary) hypertension: Secondary | ICD-10-CM | POA: Diagnosis not present

## 2017-01-15 DIAGNOSIS — H6981 Other specified disorders of Eustachian tube, right ear: Secondary | ICD-10-CM | POA: Diagnosis not present

## 2017-01-15 DIAGNOSIS — Z23 Encounter for immunization: Secondary | ICD-10-CM | POA: Diagnosis not present

## 2017-01-15 DIAGNOSIS — H6611 Chronic tubotympanic suppurative otitis media, right ear: Secondary | ICD-10-CM | POA: Diagnosis not present

## 2017-02-12 DIAGNOSIS — N200 Calculus of kidney: Secondary | ICD-10-CM | POA: Diagnosis not present

## 2017-03-11 DIAGNOSIS — H9042 Sensorineural hearing loss, unilateral, left ear, with unrestricted hearing on the contralateral side: Secondary | ICD-10-CM | POA: Diagnosis not present

## 2017-03-11 DIAGNOSIS — H6981 Other specified disorders of Eustachian tube, right ear: Secondary | ICD-10-CM | POA: Diagnosis not present

## 2017-03-11 DIAGNOSIS — H8101 Meniere's disease, right ear: Secondary | ICD-10-CM | POA: Diagnosis not present

## 2017-03-18 ENCOUNTER — Other Ambulatory Visit: Payer: Self-pay | Admitting: Urology

## 2017-04-08 HISTORY — PX: TYMPANOSTOMY TUBE PLACEMENT: SHX32

## 2017-04-10 ENCOUNTER — Encounter (HOSPITAL_COMMUNITY): Payer: Self-pay | Admitting: *Deleted

## 2017-04-10 NOTE — Patient Instructions (Addendum)
Laura Mills  04/10/2017   Your procedure is scheduled on: 04-15-17   Report to [facility] Main  Entrance Report to Short Stay/PACU at 5:30 AM   Call this number if you have problems the morning of surgery [phone]    Do not eat food or drink liquids :After Midnight.     Take these medicines the morning of surgery with A SIP OF WATER: Fexofenadine (Allegra)                                You may not have any metal on your body including hair pins and              piercings  Do not wear jewelry, make-up, lotions, powders or perfumes, deodorant             Do not wear nail polish.  Do not shave  48 hours prior to surgery.            Do not bring valuables to the hospital. [place].  Contacts, dentures or bridgework may not be worn into surgery.      Patients discharged the day of surgery will not be allowed to drive home.  Name and phone number of your driver: Laura Mills [phone]                Please read over the following fact sheets you were given: _____________________________________________________________________             [facility] - Preparing for Surgery Before surgery, you can play an important role.  Because skin is not sterile, your skin needs to be as free of germs as possible.  You can reduce the number of germs on your skin by washing with CHG (chlorahexidine gluconate) soap before surgery.  CHG is an antiseptic cleaner which kills germs and bonds with the skin to continue killing germs even after washing. Please DO NOT use if you have an allergy to CHG or antibacterial soaps.  If your skin becomes reddened/irritated stop using the CHG and inform your nurse when you arrive at Short Stay. Do not shave (including legs and underarms) for at least 48 hours prior to the first CHG shower.  You may shave your face/neck. Please follow these instructions carefully:  1.  Shower with CHG  Soap the night before surgery and the  morning of Surgery.  2.  If you choose to wash your hair, wash your hair first as usual with your  normal  shampoo.  3.  After you shampoo, rinse your hair and body thoroughly to remove the  shampoo.                           4.  Use CHG as you would any other liquid soap.  You can apply chg directly  to the skin and wash                       Gently with a scrungie or clean washcloth.  5.  Apply the CHG Soap to your body ONLY FROM THE NECK DOWN.   Do not use on face/ open  Wound or open sores. Avoid contact with eyes, ears mouth and genitals (private parts).                       Wash face,  Genitals (private parts) with your normal soap.             6.  Wash thoroughly, paying special attention to the area where your surgery  will be performed.  7.  Thoroughly rinse your body with warm water from the neck down.  8.  DO NOT shower/wash with your normal soap after using and rinsing off  the CHG Soap.                9.  Pat yourself dry with a clean towel.            10.  Wear clean pajamas.            11.  Place clean sheets on your bed the night of your first shower and do not  sleep with pets. Day of Surgery : Do not apply any lotions/deodorants the morning of surgery.  Please wear clean clothes to the hospital/surgery center.  FAILURE TO FOLLOW THESE INSTRUCTIONS MAY RESULT IN THE CANCELLATION OF YOUR SURGERY PATIENT SIGNATURE_________________________________  NURSE SIGNATURE__________________________________  ________________________________________________________________________

## 2017-04-11 ENCOUNTER — Other Ambulatory Visit: Payer: Self-pay

## 2017-04-11 ENCOUNTER — Encounter (HOSPITAL_COMMUNITY): Payer: Self-pay | Admitting: *Deleted

## 2017-04-11 ENCOUNTER — Encounter (HOSPITAL_COMMUNITY)
Admission: RE | Admit: 2017-04-11 | Discharge: 2017-04-11 | Disposition: A | Discharge status: Home / Self Care | Payer: 59 | Source: Ambulatory Visit | Attending: Urology | Admitting: Urology

## 2017-04-11 DIAGNOSIS — I10 Essential (primary) hypertension: Secondary | ICD-10-CM | POA: Diagnosis not present

## 2017-04-11 DIAGNOSIS — N393 Stress incontinence (female) (male): Secondary | ICD-10-CM | POA: Diagnosis not present

## 2017-04-11 DIAGNOSIS — Z01810 Encounter for preprocedural cardiovascular examination: Secondary | ICD-10-CM | POA: Insufficient documentation

## 2017-04-11 DIAGNOSIS — Z01812 Encounter for preprocedural laboratory examination: Secondary | ICD-10-CM | POA: Insufficient documentation

## 2017-04-11 HISTORY — DX: Hyperlipidemia, unspecified: E78.5

## 2017-04-11 HISTORY — DX: Essential (primary) hypertension: I10

## 2017-04-11 LAB — CBC
HCT: 42.2 % (ref 36.0–46.0)
HEMOGLOBIN: 13.8 g/dL (ref 12.0–15.0)
MCH: 28.2 pg (ref 26.0–34.0)
MCHC: 32.7 g/dL (ref 30.0–36.0)
MCV: 86.3 fL (ref 78.0–100.0)
Platelets: 254 10*3/uL (ref 150–400)
RBC: 4.89 MIL/uL (ref 3.87–5.11)
RDW: 13.4 % (ref 11.5–15.5)
WBC: 5.4 10*3/uL (ref 4.0–10.5)

## 2017-04-11 LAB — BASIC METABOLIC PANEL
ANION GAP: 5 (ref 5–15)
BUN: 13 mg/dL (ref 6–20)
CALCIUM: 9.3 mg/dL (ref 8.9–10.3)
CHLORIDE: 110 mmol/L (ref 101–111)
CO2: 25 mmol/L (ref 22–32)
Creatinine, Ser: 0.87 mg/dL (ref 0.44–1.00)
GFR calc non Af Amer: >60 mL/min (ref >60)
Glucose, Bld: 108 mg/dL — ABNORMAL HIGH (ref 65–99)
Potassium: 4 mmol/L (ref 3.5–5.1)
Sodium: 140 mmol/L (ref 135–145)

## 2017-04-15 ENCOUNTER — Ambulatory Visit (HOSPITAL_COMMUNITY)
Admission: RE | Admit: 2017-04-15 | Discharge: 2017-04-15 | Disposition: A | Discharge status: Home / Self Care | Payer: 59 | Source: Ambulatory Visit | Attending: Urology | Admitting: Urology

## 2017-04-15 ENCOUNTER — Ambulatory Visit (HOSPITAL_COMMUNITY): Payer: 59 | Admitting: Anesthesiology

## 2017-04-15 ENCOUNTER — Other Ambulatory Visit: Payer: Self-pay

## 2017-04-15 ENCOUNTER — Encounter (HOSPITAL_COMMUNITY)
Admission: RE | Disposition: A | Discharge status: Home / Self Care | Payer: Self-pay | Source: Ambulatory Visit | Attending: Urology

## 2017-04-15 ENCOUNTER — Encounter (HOSPITAL_COMMUNITY): Payer: Self-pay | Admitting: Anesthesiology

## 2017-04-15 DIAGNOSIS — M47816 Spondylosis without myelopathy or radiculopathy, lumbar region: Secondary | ICD-10-CM | POA: Insufficient documentation

## 2017-04-15 DIAGNOSIS — N3946 Mixed incontinence: Secondary | ICD-10-CM | POA: Insufficient documentation

## 2017-04-15 DIAGNOSIS — K219 Gastro-esophageal reflux disease without esophagitis: Secondary | ICD-10-CM | POA: Diagnosis not present

## 2017-04-15 DIAGNOSIS — E785 Hyperlipidemia, unspecified: Secondary | ICD-10-CM | POA: Diagnosis not present

## 2017-04-15 DIAGNOSIS — N393 Stress incontinence (female) (male): Secondary | ICD-10-CM | POA: Diagnosis not present

## 2017-04-15 DIAGNOSIS — M1611 Unilateral primary osteoarthritis, right hip: Secondary | ICD-10-CM | POA: Diagnosis not present

## 2017-04-15 DIAGNOSIS — Z881 Allergy status to other antibiotic agents status: Secondary | ICD-10-CM | POA: Diagnosis not present

## 2017-04-15 DIAGNOSIS — Z87442 Personal history of urinary calculi: Secondary | ICD-10-CM | POA: Diagnosis not present

## 2017-04-15 DIAGNOSIS — M1990 Unspecified osteoarthritis, unspecified site: Secondary | ICD-10-CM | POA: Diagnosis not present

## 2017-04-15 DIAGNOSIS — I10 Essential (primary) hypertension: Secondary | ICD-10-CM | POA: Insufficient documentation

## 2017-04-15 DIAGNOSIS — Z79899 Other long term (current) drug therapy: Secondary | ICD-10-CM | POA: Diagnosis not present

## 2017-04-15 HISTORY — PX: PUBOVAGINAL SLING: SHX1035

## 2017-04-15 SURGERY — CREATION, PUBOVAGINAL SLING
Anesthesia: General

## 2017-04-15 MED ORDER — PHENYLEPHRINE 40 MCG/ML (10ML) SYRINGE FOR IV PUSH (FOR BLOOD PRESSURE SUPPORT)
PREFILLED_SYRINGE | INTRAVENOUS | Status: AC
  Filled 2017-04-15: qty 20

## 2017-04-15 MED ORDER — SUGAMMADEX SODIUM 200 MG/2ML IV SOLN
INTRAVENOUS | Status: DC | PRN
  Administered 2017-04-15: 150 mg via INTRAVENOUS

## 2017-04-15 MED ORDER — BACITRACIN ZINC 500 UNIT/GM EX OINT
TOPICAL_OINTMENT | CUTANEOUS | Status: AC
  Filled 2017-04-15: qty 28.35

## 2017-04-15 MED ORDER — PROPOFOL 10 MG/ML IV BOLUS
INTRAVENOUS | Status: AC
  Filled 2017-04-15: qty 20

## 2017-04-15 MED ORDER — ONDANSETRON HCL 4 MG/2ML IJ SOLN
INTRAMUSCULAR | Status: DC | PRN
  Administered 2017-04-15: 4 mg via INTRAVENOUS

## 2017-04-15 MED ORDER — INDIGOTINDISULFONATE SODIUM 8 MG/ML IJ SOLN
INTRAMUSCULAR | Status: AC
Start: 2017-04-15 — End: ?
  Filled 2017-04-15: qty 5

## 2017-04-15 MED ORDER — DEXAMETHASONE SODIUM PHOSPHATE 10 MG/ML IJ SOLN
INTRAMUSCULAR | Status: AC
  Filled 2017-04-15: qty 1

## 2017-04-15 MED ORDER — LACTATED RINGERS IV SOLN: INTRAVENOUS | Status: DC

## 2017-04-15 MED ORDER — LIDOCAINE 2% (20 MG/ML) 5 ML SYRINGE: INTRAMUSCULAR | Status: DC | PRN

## 2017-04-15 MED ORDER — CEPHALEXIN 500 MG PO CAPS: 500.0000 mg | ORAL_CAPSULE | Freq: Two times a day (BID) | ORAL | 0 refills | Status: AC

## 2017-04-15 MED ORDER — ROCURONIUM BROMIDE 50 MG/5ML IV SOSY
PREFILLED_SYRINGE | INTRAVENOUS | Status: AC
  Filled 2017-04-15: qty 5

## 2017-04-15 MED ORDER — CEFAZOLIN SODIUM-DEXTROSE 2-4 GM/100ML-% IV SOLN
2.0000 g | Freq: Once | INTRAVENOUS | Status: AC
  Administered 2017-04-15: 2 g via INTRAVENOUS
  Filled 2017-04-15: qty 100

## 2017-04-15 MED ORDER — LIDOCAINE-EPINEPHRINE (PF) 1 %-1:200000 IJ SOLN
INTRAMUSCULAR | Status: AC
  Filled 2017-04-15: qty 30

## 2017-04-15 MED ORDER — STERILE WATER FOR IRRIGATION IR SOLN
Status: DC | PRN
  Administered 2017-04-15: 3000 mL

## 2017-04-15 MED ORDER — SODIUM CHLORIDE 0.9 % IJ SOLN
INTRAMUSCULAR | Status: AC
  Filled 2017-04-15: qty 50

## 2017-04-15 MED ORDER — STERILE WATER FOR IRRIGATION IR SOLN
Status: DC | PRN
  Administered 2017-04-15: 1000 mL

## 2017-04-15 MED ORDER — ESTRADIOL 0.1 MG/GM VA CREA
TOPICAL_CREAM | VAGINAL | Status: AC
  Filled 2017-04-15: qty 42.5

## 2017-04-15 MED ORDER — ONDANSETRON HCL 4 MG/2ML IJ SOLN
INTRAMUSCULAR | Status: AC
  Filled 2017-04-15: qty 2

## 2017-04-15 MED ORDER — ROCURONIUM BROMIDE 10 MG/ML (PF) SYRINGE
PREFILLED_SYRINGE | INTRAVENOUS | Status: DC | PRN
  Administered 2017-04-15: 40 mg via INTRAVENOUS
  Administered 2017-04-15: 10 mg via INTRAVENOUS

## 2017-04-15 MED ORDER — BACITRACIN ZINC 500 UNIT/GM EX OINT
TOPICAL_OINTMENT | CUTANEOUS | Status: DC | PRN
  Administered 2017-04-15: 1 via TOPICAL

## 2017-04-15 MED ORDER — FENTANYL CITRATE (PF) 250 MCG/5ML IJ SOLN
INTRAMUSCULAR | Status: AC
  Filled 2017-04-15: qty 5

## 2017-04-15 MED ORDER — SODIUM CHLORIDE 0.9 % IV SOLN
INTRAVENOUS | Status: AC
  Filled 2017-04-15: qty 500000

## 2017-04-15 MED ORDER — MIDAZOLAM HCL 2 MG/2ML IJ SOLN
INTRAMUSCULAR | Status: AC
  Filled 2017-04-15: qty 2

## 2017-04-15 MED ORDER — SUGAMMADEX SODIUM 200 MG/2ML IV SOLN
INTRAVENOUS | Status: AC
  Filled 2017-04-15: qty 2

## 2017-04-15 MED ORDER — MIDAZOLAM HCL 5 MG/5ML IJ SOLN
INTRAMUSCULAR | Status: DC | PRN
  Administered 2017-04-15: 2 mg via INTRAVENOUS

## 2017-04-15 MED ORDER — PHENYLEPHRINE 40 MCG/ML (10ML) SYRINGE FOR IV PUSH (FOR BLOOD PRESSURE SUPPORT)
PREFILLED_SYRINGE | INTRAVENOUS | Status: DC | PRN
  Administered 2017-04-15 (×2): 40 ug via INTRAVENOUS
  Administered 2017-04-15 (×3): 80 ug via INTRAVENOUS

## 2017-04-15 MED ORDER — SODIUM CHLORIDE 0.9 % IV SOLN
INTRAVENOUS | Status: DC | PRN
  Administered 2017-04-15: 200 mL

## 2017-04-15 MED ORDER — LACTATED RINGERS IV SOLN
INTRAVENOUS | Status: DC | PRN
  Administered 2017-04-15: 07:00:00 via INTRAVENOUS

## 2017-04-15 MED ORDER — DEXAMETHASONE SODIUM PHOSPHATE 10 MG/ML IJ SOLN
INTRAMUSCULAR | Status: DC | PRN
  Administered 2017-04-15: 10 mg via INTRAVENOUS

## 2017-04-15 MED ORDER — LIDOCAINE-EPINEPHRINE (PF) 1 %-1:200000 IJ SOLN
INTRAMUSCULAR | Status: DC | PRN
  Administered 2017-04-15: 6 mL

## 2017-04-15 MED ORDER — PROPOFOL 10 MG/ML IV BOLUS
INTRAVENOUS | Status: DC | PRN
  Administered 2017-04-15: 140 mg via INTRAVENOUS

## 2017-04-15 MED ORDER — SODIUM CHLORIDE 0.9 % IJ SOLN
INTRAMUSCULAR | Status: DC | PRN
  Administered 2017-04-15: 6 mL

## 2017-04-15 MED ORDER — FENTANYL CITRATE (PF) 100 MCG/2ML IJ SOLN
INTRAMUSCULAR | Status: DC | PRN
  Administered 2017-04-15 (×2): 50 ug via INTRAVENOUS
  Administered 2017-04-15: 100 ug via INTRAVENOUS
  Administered 2017-04-15: 50 ug via INTRAVENOUS

## 2017-04-15 SURGICAL SUPPLY — 52 items
ADH SKN CLS APL DERMABOND .7 (GAUZE/BANDAGES/DRESSINGS) ×1
BAG DECANTER FOR FLEXI CONT (MISCELLANEOUS) ×2 IMPLANT
BAG URINE DRAINAGE (UROLOGICAL SUPPLIES) ×1 IMPLANT
BLADE HEX COATED 2.75 (ELECTRODE) ×3 IMPLANT
BLADE SURG 15 STRL LF DISP TIS (BLADE) ×1 IMPLANT
BLADE SURG 15 STRL SS (BLADE) ×3
CATH FOLEY 2WAY SLVR  5CC 14FR (CATHETERS) ×4
CATH FOLEY 2WAY SLVR 5CC 14FR (CATHETERS) ×1 IMPLANT
COVER MAYO STAND STRL (DRAPES) ×2 IMPLANT
DECANTER SPIKE VIAL GLASS SM (MISCELLANEOUS) ×3 IMPLANT
DERMABOND ADVANCED (GAUZE/BANDAGES/DRESSINGS) ×2
DERMABOND ADVANCED .7 DNX12 (GAUZE/BANDAGES/DRESSINGS) ×2 IMPLANT
DRAIN PENROSE 18X1/4 LTX STRL (WOUND CARE) IMPLANT
DRAPE SHEET LG 3/4 BI-LAMINATE (DRAPES) ×3 IMPLANT
DRAPE UNDERBUTTOCKS STRL (DRAPE) ×3 IMPLANT
ELECT PENCIL ROCKER SW 15FT (MISCELLANEOUS) ×3 IMPLANT
GAUZE PACKING 1 X5 YD ST (GAUZE/BANDAGES/DRESSINGS) ×2 IMPLANT
GAUZE PACKING 2X5 YD STRL (GAUZE/BANDAGES/DRESSINGS) ×1 IMPLANT
GAUZE SPONGE 4X4 16PLY XRAY LF (GAUZE/BANDAGES/DRESSINGS) ×6 IMPLANT
GLOVE BIOGEL M STRL SZ7.5 (GLOVE) ×7 IMPLANT
GLOVE ECLIPSE 8.0 STRL XLNG CF (GLOVE) ×4 IMPLANT
GOWN STRL REUS W/TWL XL LVL3 (GOWN DISPOSABLE) ×5 IMPLANT
HOLDER FOLEY CATH W/STRAP (MISCELLANEOUS) IMPLANT
KIT BASIN OR (CUSTOM PROCEDURE TRAY) ×3 IMPLANT
NDL MAYO 6 CRC TAPER PT (NEEDLE) IMPLANT
NEEDLE HYPO 22GX1.5 SAFETY (NEEDLE) IMPLANT
NEEDLE MAYO 6 CRC TAPER PT (NEEDLE) IMPLANT
PACK CYSTO (CUSTOM PROCEDURE TRAY) ×3 IMPLANT
PLUG CATH AND CAP STER (CATHETERS) ×3 IMPLANT
RETRACTOR STAY HOOK 5MM (MISCELLANEOUS) IMPLANT
SHEET LAVH (DRAPES) ×3 IMPLANT
SLING ALTIS SYSTEM (Sling) ×2 IMPLANT
SUT CAPIO ETHIBPND (SUTURE) IMPLANT
SUT CAPIO POLYGLYCOLIC (SUTURE) IMPLANT
SUT ETHIBOND 0 (SUTURE) IMPLANT
SUT SILK 2 0 SH (SUTURE) ×4 IMPLANT
SUT VIC AB 0 CT1 27 (SUTURE)
SUT VIC AB 0 CT1 27XBRD ANTBC (SUTURE) IMPLANT
SUT VIC AB 2-0 CT1 27 (SUTURE)
SUT VIC AB 2-0 CT1 27XBRD (SUTURE) IMPLANT
SUT VIC AB 2-0 SH 27 (SUTURE) ×3
SUT VIC AB 2-0 SH 27X BRD (SUTURE) ×2 IMPLANT
SUT VIC AB 3-0 SH 27 (SUTURE)
SUT VIC AB 3-0 SH 27XBRD (SUTURE) IMPLANT
SUT VIC AB 4-0 PS2 27 (SUTURE) ×1 IMPLANT
SUT VICRYL 0 UR6 27IN ABS (SUTURE) IMPLANT
SYR 10ML LL (SYRINGE) ×3 IMPLANT
TOWEL NATURAL 10PK STERILE (DISPOSABLE) ×3 IMPLANT
TOWEL OR NON WOVEN STRL DISP B (DISPOSABLE) ×3 IMPLANT
TUBING CONNECTING 10 (TUBING) ×1 IMPLANT
TUBING CONNECTING 10' (TUBING) ×1
YANKAUER SUCT BULB TIP 10FT TU (MISCELLANEOUS) ×3 IMPLANT

## 2017-04-15 NOTE — Anesthesia Procedure Notes (Signed)
Procedure Name: Intubation Date/Time: 04/15/2017 7:58 AM Performed by: Anne Fu, CRNA Pre-anesthesia Checklist: Patient identified, Emergency Drugs available, Suction available, Patient being monitored and Timeout performed Patient Re-evaluated:Patient Re-evaluated prior to induction Oxygen Delivery Method: Circle system utilized Preoxygenation: Pre-oxygenation with 100% oxygen Induction Type: IV induction Ventilation: Mask ventilation without difficulty Laryngoscope Size: Mac, 4 and 3 Grade View: Grade III Tube type: Oral Tube size: 7.5 mm Number of attempts: 1 Airway Equipment and Method: Stylet and Video-laryngoscopy Placement Confirmation: ETT inserted through vocal cords under direct vision,  positive ETCO2 and breath sounds checked- equal and bilateral Secured at: 19 cm Tube secured with: Tape Dental Injury: Teeth and Oropharynx as per pre-operative assessment  Difficulty Due To: Difficult Airway- due to anterior larynx, Difficult Airway- due to limited oral opening and Difficulty was anticipated Comments: DL X 1 with Grade III view MAC 4 - converted to glidescope with grade I view X 1 attempted noted clear equal bilateral lung sounds post intubation.

## 2017-04-15 NOTE — Anesthesia Preprocedure Evaluation (Signed)
Anesthesia Evaluation  Patient identified by MRN, date of birth, ID band Patient awake    Reviewed: Allergy & Precautions, H&P , NPO status , Patient's Chart, lab work & pertinent test results, reviewed documented beta blocker date and time   Airway Mallampati: II  TM Distance: >3 FB Neck ROM: Full    Dental no notable dental hx.    Pulmonary neg pulmonary ROS,    Pulmonary exam normal breath sounds clear to auscultation       Cardiovascular hypertension, Pt. on medications and Pt. on home beta blockers negative cardio ROS Normal cardiovascular exam Rhythm:Regular Rate:Normal     Neuro/Psych negative neurological ROS  negative psych ROS   GI/Hepatic negative GI ROS, Neg liver ROS, GERD  ,  Endo/Other  negative endocrine ROS  Renal/GU negative Renal ROS  negative genitourinary   Musculoskeletal negative musculoskeletal ROS (+) Arthritis , Osteoarthritis,    Abdominal   Peds negative pediatric ROS (+)  Hematology negative hematology ROS (+)   Anesthesia Other Findings   Reproductive/Obstetrics negative OB ROS                             Anesthesia Physical  Anesthesia Plan  ASA: II  Anesthesia Plan: General   Post-op Pain Management:    Induction: Intravenous  PONV Risk Score and Plan: 3 and Ondansetron, Dexamethasone and Midazolam  Airway Management Planned: Oral ETT  Additional Equipment:   Intra-op Plan:   Post-operative Plan: Extubation in OR  Informed Consent: I have reviewed the patients History and Physical, chart, labs and discussed the procedure including the risks, benefits and alternatives for the proposed anesthesia with the patient or authorized representative who has indicated his/her understanding and acceptance.   Dental advisory given  Plan Discussed with: CRNA  Anesthesia Plan Comments:         Anesthesia Quick Evaluation

## 2017-04-15 NOTE — H&P (Signed)
H&P  Chief Complaint: Stress incontinence  History of present illness: 57 year old white female with several year history of subjective and objective stress incontinence.  She has failed physical therapy and medications.  She presents today for mid urethral sling.  She has been well without fever or dysuria.  Past Medical History:  Diagnosis Date  . Arthritis    LUMBAR SPINE AND RIGHT HIP  . GERD (gastroesophageal reflux disease)   . History of kidney stones   . History of kidney stones 10/2012  . Hyperlipidemia   . Hypertension   . Renal stone   . TMJ syndrome    LEFT    Past Surgical History:  Procedure Laterality Date  . ABDOMINAL HYSTERECTOMY    . CHOLECYSTECTOMY    . CHOLECYSTECTOMY  2009  . CYSTOSCOPY WITH URETEROSCOPY Right 10/02/2012   Procedure: CYSTOSCOPY WITH RIGHT RETROGRADE AND STENT PLACEMENT ;  Surgeon: Fredricka Bonine, MD;  Location: [facility];  Service: Urology;  Laterality: Right;  . CYSTOSCOPY WITH URETEROSCOPY Right 11/13/2012   Procedure: RIGHT URETEROSCOPY;  Surgeon: Fredricka Bonine, MD;  Location: [facility];  Service: Urology;  Laterality: Right;  with Stent  . LITHOTRIPSY  10/2012  . URETERAL STENT PLACEMENT      Home Medications:  Medications Prior to Admission  Medication Sig Dispense Refill Last Dose  . atorvastatin (LIPITOR) 10 MG tablet Take 10 mg by mouth every evening.   04/14/2017 at Unknown time  . CALCIUM CARBONATE-VITAMIN D PO Take 1 tablet by mouth daily.    Past Month at Unknown time  . Cyanocobalamin (VITAMIN B-12 PO) Take 1 tablet by mouth daily.   04/14/2017 at Unknown time  . fexofenadine (ALLEGRA) 180 MG tablet Take 180 mg by mouth daily.   04/14/2017 at Unknown time  . methocarbamol (ROBAXIN) 500 MG tablet Take 500 mg by mouth every 8 (eight) hours as needed for muscle spasms.    04/14/2017 at Unknown time  . metoprolol succinate (TOPROL-XL) 25 MG 24 hr tablet Take 25 mg by mouth every evening.   04/14/2017 at 2200  . oxybutynin  (DITROPAN XL) 15 MG 24 hr tablet Take 15 mg by mouth daily.   04/14/2017 at Unknown time  . traMADol (ULTRAM) 50 MG tablet Take 50 mg by mouth every 8 (eight) hours as needed for moderate pain.   04/14/2017 at Unknown time  . triamterene-hydrochlorothiazide (DYAZIDE) 37.5-25 MG capsule Take 1 capsule by mouth daily.     . TURMERIC PO Take 1 capsule by mouth daily.   04/14/2017 at Unknown time  . valACYclovir (VALTREX) 500 MG tablet Take 500 mg by mouth daily.    04/14/2017 at Unknown time  . Diclofenac Sodium (PENNSAID) 2 % SOLN Place 1 application onto the skin 2 (two) times daily as needed (for pain).   Unknown at Unknown time  . estradiol (ESTRACE) 2 MG tablet Take 2 mg by mouth daily.    Unknown at Unknown time   Allergies:  Allergies  Allergen Reactions  . Ciprofloxacin Other (See Comments)    Sharp pain in my shoulders and body aches  . Macrodantin [Nitrofurantoin] Hives  . Minocin [Minocycline Hcl] Hives    Family History  Problem Relation Age of Onset  . Colon cancer Neg Hx   . Esophageal cancer Neg Hx   . Rectal cancer Neg Hx   . Stomach cancer Neg Hx    Social History:  reports that  has never smoked. she has never used smokeless tobacco. She reports  that she does not drink alcohol or use drugs.  ROS: A complete review of systems was performed.  All systems are negative except for pertinent findings as noted. Review of Systems  All other systems reviewed and are negative.    Physical Exam:  Vital signs in last 24 hours: Temp:  [98.1 F (36.7 C)] 98.1 F (36.7 C) (01/08 0559) Pulse Rate:  [66] 66 (01/08 0559) Resp:  [16] 16 (01/08 0559) BP: (108)/(75) 108/75 (01/08 0559) SpO2:  [100 %] 100 % (01/08 0559) Weight:  [74.4 kg (164 lb)] 74.4 kg (164 lb) (01/08 0626) General:  Alert and oriented, No acute distress HEENT: Normocephalic, atraumatic Cardiovascular: Regular rate and rhythm Lungs: Regular rate and effort Abdomen: Soft, nontender, nondistended, no abdominal  masses Extremities: No edema Neurologic: Grossly intact  Laboratory Data:  No results found for this or any previous visit (from the past 24 hour(s)). No results found for this or any previous visit (from the past 240 hour(s)). Creatinine: Recent Labs    04/11/17 1452  CREATININE 0.87    Impression/Assessment:  Stress incontinence, frequency, urgency-  Plan:  I discussed with the patient and her husband the nature, potential benefits, risks and alternatives to mid urethral polypropylene sling, including side effects of the proposed treatment, the likelihood of the patient achieving the goals of the procedure, and any potential problems that might occur during the procedure or recuperation.  We discussed frequency, urgency and urge incontinence symptoms can improve after a mid urethral sling but sometimes stay the same and rarely worsen.  All questions answered. Patient elects to proceed.   Festus Aloe 04/15/2017, 7:39 AM

## 2017-04-15 NOTE — Anesthesia Postprocedure Evaluation (Signed)
Anesthesia Post Note  Patient: Laura Mills  Procedure(s) Performed: Gaynelle Arabian (N/A )     Patient location during evaluation: PACU Anesthesia Type: General Level of consciousness: awake and alert Pain management: pain level controlled Vital Signs Assessment: post-procedure vital signs reviewed and stable Respiratory status: spontaneous breathing, nonlabored ventilation and respiratory function stable Cardiovascular status: blood pressure returned to baseline and stable Postop Assessment: no apparent nausea or vomiting Anesthetic complications: no    Last Vitals:  Vitals:   04/15/17 1015 04/15/17 1030  BP: 94/63 93/66  Pulse: 66 67  Resp: 12 15  Temp:    SpO2: 100% 100%    Last Pain:  Vitals:   04/15/17 1015  TempSrc:   PainSc: [place]

## 2017-04-15 NOTE — Discharge Instructions (Signed)
Urethral Vaginal Sling, Care After Refer to this sheet in the next few weeks. These instructions provide you with information on caring for yourself after your procedure. Your health care provider may also give you more specific instructions. Your treatment has been planned according to current medical practices, but problems sometimes occur. Call your health care provider if you have any problems or questions after your procedure. What can I expect after the procedure? After your procedure, it is typical to have the following:  A catheter in your bladder until your bladder is able to work on its own properly. You will be instructed on how to empty the catheter bag.  Absorbable stitches in your incisions. They will slowly dissolve over 1-2 months.  REMOVAL OF THE CATHETER AND VAGINAL PACK: 1) Remove the vaginal packing Wednesday morning, April 16, 2017.  2) Next, remove the Foley catheter as instructed  Follow these instructions at home:  Get plenty of rest.  Only take over-the-counter or prescription medicines as directed by your health care provider. Do not take aspirin because it can cause bleeding.  Do not take baths. Take showers until your health care provider tells you otherwise.  You may resume your usual diet. Eat a well-balanced diet.  Drink enough fluids to keep your urine clear or pale yellow.  Limit exercise and activities as directed by your health care provider. Do not lift anything heavier than 5 pounds (2.3 kg).  Do not douche, use tampons, or have sexual intercourse for 6 weeks after your procedure.  Follow up with your health care provider as directed.  Drink plenty of water and take a stool softener as needed -- avoid constipation! Contact a health care provider if:  You have a heavy or bad smelling vaginal discharge.  You have a rash.  You have pain that is not controlled with medicines.  You have lightheadedness or feel faint. Get help right away  if:  You have a fever.  You have vaginal bleeding.  You faint.  You have shortness of breath.  You have chest, abdominal, or leg pain.  You have pain when urinating or cannot urinate.  Your catheter is still in your bladder and becomes blocked.  You have swelling, redness, and pain in the vaginal area. This information is not intended to replace advice given to you by your health care provider. Make sure you discuss any questions you have with your health care provider. Document Released: 01/13/2013 Document Revised: 08/31/2015 Document Reviewed: 09/11/2012 Elsevier Interactive Patient Education  2018 [place], Adult Take good care of your catheter to keep it working and to prevent problems. How to wear your catheter Attach your catheter to your leg with tape (adhesive tape) or a leg strap. Make sure it is not too tight. If you use tape, remove any bits of tape that are already on the catheter. How to wear a drainage bag You should have:  A large overnight bag.  A small leg bag.  Overnight Bag You may wear the overnight bag at any time. Always keep the bag below the level of your bladder but off the floor. When you sleep, put a clean plastic bag in a wastebasket. Then hang the bag inside the wastebasket. Leg Bag Never wear the leg bag at night. Always wear the leg bag below your knee. Keep the leg bag secure with a leg strap or tape. How to care for your skin  Clean the skin around the  catheter at least once every day.  Shower every day. Do not take baths.  Put creams, lotions, or ointments on your genital area only as told by your doctor.  Do not use powders, sprays, or lotions on your genital area. How to clean your catheter and your skin 1. Wash your hands with soap and water. 2. Wet a washcloth in warm water and gentle (mild) soap. 3. Use the washcloth to clean the skin where the catheter enters your body. Clean downward  and wipe away from the catheter in small circles. Do not wipe toward the catheter. 4. Pat the area dry with a clean towel. Make sure to clean off all soap. How to care for your drainage bags Empty your drainage bag when it is ?- full or at least 2-3 times a day. Replace your drainage bag once a month or sooner if it starts to smell bad or look dirty. Do not clean your drainage bag unless told by your doctor. Emptying a drainage bag  Supplies Needed  Rubbing alcohol.  Gauze pad or cotton ball.  Tape or a leg strap.  Steps 1. Wash your hands with soap and water. 2. Separate (detach) the bag from your leg. 3. Hold the bag over the toilet or a clean container. Keep the bag below your hips and bladder. This stops pee (urine) from going back into the tube. 4. Open the pour spout at the bottom of the bag. 5. Empty the pee into the toilet or container. Do not let the pour spout touch any surface. 6. Put rubbing alcohol on a gauze pad or cotton ball. 7. Use the gauze pad or cotton ball to clean the pour spout. 8. Close the pour spout. 9. Attach the bag to your leg with tape or a leg strap. 10. Wash your hands.  Changing a drainage bag Supplies Needed  Alcohol wipes.  A clean drainage bag.  Adhesive tape or a leg strap.  Steps 1. Wash your hands with soap and water. 2. Separate the dirty bag from your leg. 3. Pinch the rubber catheter with your fingers so that pee does not spill out. 4. Separate the catheter tube from the drainage tube where these tubes connect (at the connection valve). Do not let the tubes touch any surface. 5. Clean the end of the catheter tube with an alcohol wipe. Use a different alcohol wipe to clean the end of the drainage tube. 6. Connect the catheter tube to the drainage tube of the clean bag. 7. Attach the new bag to the leg with adhesive tape or a leg strap. 8. Wash your hands.  How to prevent infection and other problems  Never pull on your catheter  or try to remove it. Pulling can damage tissue in your body.  Always wash your hands before and after touching your catheter.  If a leg strap gets wet, replace it with a dry one.  Drink enough fluids to keep your pee clear or pale yellow, or as told by your doctor.  Do not let the drainage bag or tubing touch the floor.  Wear cotton underwear.  If you are female, wipe from front to back after you poop (have a bowel movement).  Check on the catheter often to make sure it works and the tubing is not twisted. Get help if:  Your pee is cloudy.  Your pee smells unusually bad.  Your pee is not draining into the bag.  Your tube gets clogged.  Your catheter  starts to leak.  Your bladder feels full. Get help right away if:  You have redness, swelling, or pain where the catheter enters your body.  You have fluid, pus, or a bad smell coming from the area where the catheter enters your body.  The area where the catheter enters your body feels warm.  You have a fever.  You have pain in your: ? Stomach (abdomen). ? Legs. ? Lower back. ? Bladder.  You see blood fill the catheter.  Your pee is pink or red.  You feel sick to your stomach (nauseous).  You throw up (vomit).  You have chills.  Your catheter gets pulled out. This information is not intended to replace advice given to you by your health care provider. Make sure you discuss any questions you have with your health care provider. Document Released: 07/20/2012 Document Revised: 02/21/2016 Document Reviewed: 09/07/2013 Elsevier Interactive Patient Education  [employer].

## 2017-04-15 NOTE — Op Note (Signed)
Preoperative diagnosis: Stress urinary incontinence postoperative diagnosis: Same  Procedure: [place] single incision mid urethral sling  Surgeon: Junious Silk Assistant: Lovena Neighbours  Anesthesia: General  Indication for procedure: 57 year old with subjective and objective stress urinary incontinence.  She failed prior medication and behavioral therapy and wished to  proceed with mid urethral sling.  Findings: Well estrogenized vagina, normal cystoscopy with normal bladder and reflux of clear urine after sling placement.  Description of procedure: After consent was obtained patient brought to the operating room.  After adequate anesthesia she was placed in lithotomy position and prepped and draped in the usual sterile fashion.  A timeout was performed to confirm the patient and procedure.  A 16 French Foley was placed to drain the bladder.  The bladder neck and urethra were marked and hydrodissection was performed with about 10 cc of injectable saline and 5 cc of Xylocaine with epinephrine.  A 1-1/2 cm incision was made in the anterior vaginal wall under the urethra.  With an Allis clamp the periurethral tissue was dissected out laterally to the right ischio pubic ramus using a spread technique with the Metzenbaum scissors.  Similar dissection was carried out to the left ischio pubic ramus.  The static portion of the sling was placed on the introducer and it was passed through the incision to the right issue pubic ramus and then posteriorly and pushed into the obturator internus aiming at the 10 o'clock position.  The handle was then rotated a quarter of a turn to place the anchor through the obturator membrane.  The vaginal fornix was inspected and noted to be normal.  The dynamic arm of the sling was placed on the introducer and similarly passed behind the left ischio pubic ramus through the obturator internus and through the obturator membrane anchoring this side as well toward the 2 o'clock position.  The  suture loop was then pulled across the patient's midline until the sling was laying flat against the urethra in a tension-free fashion. A Kelly and right angle clamp could easily pass between the urethra and the sling.  Cystoscopy was then performed by removing the catheter and inserting the rigid cystoscope.  The urethra appeared normal.  The bladder neck was normal.  The ureteral orifice ease appeared normal with clear reflux bilaterally.  The remainder of the bladder appeared normal.  The scope was removed and the catheter replaced with clear drainage.  The tensioning suture was cut as close to the left pelvic sidewall as possible.  The sling was irrigated and the incision closed with a 2-0 running locking Vicryl suture.  The incision and vagina were irrigated and a vaginal packing with triple antibiotic ointment was placed.  The patient was awakened and taken to the recovery room in stable condition.  Complications: None  Blood loss: 50 mL  Specimens: None  Drains: 16 French Foley catheter  Disposition: Patient stable to PACU

## 2017-04-15 NOTE — Transfer of Care (Signed)
Immediate Anesthesia Transfer of Care Note  Patient: Laura Mills  Procedure(s) Performed: Procedure(s) with comments: PUBO-VAGINAL SLING (N/A) - NEEDS 90 MIN FOR PROCEDURE  Patient Location: PACU  Anesthesia Type:General  Level of Consciousness:  sedated, patient cooperative and responds to stimulation  Airway & Oxygen Therapy:Patient Spontanous Breathing and Patient connected to face mask oxgen  Post-op Assessment:  Report given to PACU RN and Post -op Vital signs reviewed and stable  Post vital signs:  Reviewed and stable  Last Vitals:  Vitals:   04/15/17 0559  BP: 108/75  Pulse: 66  Resp: 16  Temp: 36.7 C  SpO2: 803%    Complications: No apparent anesthesia complications

## 2017-04-16 ENCOUNTER — Encounter (HOSPITAL_COMMUNITY): Payer: Self-pay | Admitting: Urology

## 2017-04-18 DIAGNOSIS — E782 Mixed hyperlipidemia: Secondary | ICD-10-CM | POA: Diagnosis not present

## 2017-04-22 DIAGNOSIS — N3946 Mixed incontinence: Secondary | ICD-10-CM | POA: Diagnosis not present

## 2017-04-29 DIAGNOSIS — Z01419 Encounter for gynecological examination (general) (routine) without abnormal findings: Secondary | ICD-10-CM | POA: Diagnosis not present

## 2017-04-29 DIAGNOSIS — Z6829 Body mass index (BMI) 29.0-29.9, adult: Secondary | ICD-10-CM | POA: Diagnosis not present

## 2017-05-01 DIAGNOSIS — H6981 Other specified disorders of Eustachian tube, right ear: Secondary | ICD-10-CM | POA: Diagnosis not present

## 2017-05-01 DIAGNOSIS — H9042 Sensorineural hearing loss, unilateral, left ear, with unrestricted hearing on the contralateral side: Secondary | ICD-10-CM | POA: Diagnosis not present

## 2017-05-01 DIAGNOSIS — H8101 Meniere's disease, right ear: Secondary | ICD-10-CM | POA: Diagnosis not present

## 2017-06-12 DIAGNOSIS — R32 Unspecified urinary incontinence: Secondary | ICD-10-CM | POA: Diagnosis not present

## 2017-07-02 DIAGNOSIS — N3946 Mixed incontinence: Secondary | ICD-10-CM | POA: Diagnosis not present

## 2017-08-18 DIAGNOSIS — N3946 Mixed incontinence: Secondary | ICD-10-CM | POA: Diagnosis not present

## 2017-09-09 DIAGNOSIS — K219 Gastro-esophageal reflux disease without esophagitis: Secondary | ICD-10-CM | POA: Diagnosis not present

## 2017-09-09 DIAGNOSIS — R490 Dysphonia: Secondary | ICD-10-CM | POA: Diagnosis not present

## 2017-09-10 DIAGNOSIS — L821 Other seborrheic keratosis: Secondary | ICD-10-CM | POA: Diagnosis not present

## 2017-09-10 DIAGNOSIS — D225 Melanocytic nevi of trunk: Secondary | ICD-10-CM | POA: Diagnosis not present

## 2017-09-10 DIAGNOSIS — D1801 Hemangioma of skin and subcutaneous tissue: Secondary | ICD-10-CM | POA: Diagnosis not present

## 2017-09-14 DIAGNOSIS — Z23 Encounter for immunization: Secondary | ICD-10-CM | POA: Diagnosis not present

## 2017-09-19 DIAGNOSIS — N390 Urinary tract infection, site not specified: Secondary | ICD-10-CM | POA: Diagnosis not present

## 2017-09-19 DIAGNOSIS — R350 Frequency of micturition: Secondary | ICD-10-CM | POA: Diagnosis not present

## 2017-09-19 DIAGNOSIS — B9620 Unspecified Escherichia coli [E. coli] as the cause of diseases classified elsewhere: Secondary | ICD-10-CM | POA: Diagnosis not present

## 2017-09-19 DIAGNOSIS — N3946 Mixed incontinence: Secondary | ICD-10-CM | POA: Diagnosis not present

## 2017-09-26 DIAGNOSIS — K219 Gastro-esophageal reflux disease without esophagitis: Secondary | ICD-10-CM | POA: Diagnosis not present

## 2017-10-22 DIAGNOSIS — N3946 Mixed incontinence: Secondary | ICD-10-CM | POA: Diagnosis not present

## 2017-10-22 DIAGNOSIS — R350 Frequency of micturition: Secondary | ICD-10-CM | POA: Diagnosis not present

## 2017-10-22 DIAGNOSIS — R351 Nocturia: Secondary | ICD-10-CM | POA: Diagnosis not present

## 2017-11-06 DIAGNOSIS — N3946 Mixed incontinence: Secondary | ICD-10-CM | POA: Diagnosis not present

## 2017-12-01 DIAGNOSIS — M545 Low back pain: Secondary | ICD-10-CM | POA: Diagnosis not present

## 2017-12-01 DIAGNOSIS — M47816 Spondylosis without myelopathy or radiculopathy, lumbar region: Secondary | ICD-10-CM | POA: Diagnosis not present

## 2017-12-01 DIAGNOSIS — M7061 Trochanteric bursitis, right hip: Secondary | ICD-10-CM | POA: Diagnosis not present

## 2017-12-12 DIAGNOSIS — N3946 Mixed incontinence: Secondary | ICD-10-CM | POA: Diagnosis not present

## 2017-12-14 DIAGNOSIS — Z23 Encounter for immunization: Secondary | ICD-10-CM | POA: Diagnosis not present

## 2018-01-20 DIAGNOSIS — N3946 Mixed incontinence: Secondary | ICD-10-CM | POA: Diagnosis not present

## 2018-01-21 DIAGNOSIS — E7800 Pure hypercholesterolemia, unspecified: Secondary | ICD-10-CM | POA: Diagnosis not present

## 2018-01-21 DIAGNOSIS — I10 Essential (primary) hypertension: Secondary | ICD-10-CM | POA: Diagnosis not present

## 2018-01-21 DIAGNOSIS — K219 Gastro-esophageal reflux disease without esophagitis: Secondary | ICD-10-CM | POA: Diagnosis not present

## 2018-01-21 DIAGNOSIS — Z23 Encounter for immunization: Secondary | ICD-10-CM | POA: Diagnosis not present

## 2018-01-28 DIAGNOSIS — N393 Stress incontinence (female) (male): Secondary | ICD-10-CM | POA: Diagnosis not present

## 2018-02-06 DIAGNOSIS — N3946 Mixed incontinence: Secondary | ICD-10-CM | POA: Diagnosis not present

## 2018-02-24 DIAGNOSIS — M47816 Spondylosis without myelopathy or radiculopathy, lumbar region: Secondary | ICD-10-CM | POA: Diagnosis not present

## 2018-02-24 DIAGNOSIS — M7061 Trochanteric bursitis, right hip: Secondary | ICD-10-CM | POA: Diagnosis not present

## 2018-02-24 DIAGNOSIS — M545 Low back pain: Secondary | ICD-10-CM | POA: Diagnosis not present

## 2018-04-21 DIAGNOSIS — H6981 Other specified disorders of Eustachian tube, right ear: Secondary | ICD-10-CM | POA: Diagnosis not present

## 2018-04-21 DIAGNOSIS — H60331 Swimmer's ear, right ear: Secondary | ICD-10-CM | POA: Diagnosis not present

## 2018-04-21 DIAGNOSIS — H60391 Other infective otitis externa, right ear: Secondary | ICD-10-CM | POA: Diagnosis not present

## 2018-04-21 DIAGNOSIS — H6611 Chronic tubotympanic suppurative otitis media, right ear: Secondary | ICD-10-CM | POA: Diagnosis not present

## 2018-04-21 DIAGNOSIS — H9201 Otalgia, right ear: Secondary | ICD-10-CM | POA: Diagnosis not present

## 2018-04-24 DIAGNOSIS — E7800 Pure hypercholesterolemia, unspecified: Secondary | ICD-10-CM | POA: Diagnosis not present

## 2018-05-13 DIAGNOSIS — H6981 Other specified disorders of Eustachian tube, right ear: Secondary | ICD-10-CM | POA: Diagnosis not present

## 2018-05-13 DIAGNOSIS — H9042 Sensorineural hearing loss, unilateral, left ear, with unrestricted hearing on the contralateral side: Secondary | ICD-10-CM | POA: Diagnosis not present

## 2018-05-13 DIAGNOSIS — H8101 Meniere's disease, right ear: Secondary | ICD-10-CM | POA: Diagnosis not present

## 2018-06-04 DIAGNOSIS — M545 Low back pain: Secondary | ICD-10-CM | POA: Diagnosis not present

## 2018-06-04 DIAGNOSIS — M7061 Trochanteric bursitis, right hip: Secondary | ICD-10-CM | POA: Diagnosis not present

## 2018-06-04 DIAGNOSIS — M47816 Spondylosis without myelopathy or radiculopathy, lumbar region: Secondary | ICD-10-CM | POA: Diagnosis not present

## 2018-06-10 DIAGNOSIS — H6981 Other specified disorders of Eustachian tube, right ear: Secondary | ICD-10-CM | POA: Diagnosis not present

## 2018-06-10 DIAGNOSIS — H60331 Swimmer's ear, right ear: Secondary | ICD-10-CM | POA: Diagnosis not present

## 2018-06-10 DIAGNOSIS — H8101 Meniere's disease, right ear: Secondary | ICD-10-CM | POA: Diagnosis not present

## 2018-06-10 DIAGNOSIS — H66004 Acute suppurative otitis media without spontaneous rupture of ear drum, recurrent, right ear: Secondary | ICD-10-CM | POA: Diagnosis not present

## 2018-06-25 DIAGNOSIS — Z01419 Encounter for gynecological examination (general) (routine) without abnormal findings: Secondary | ICD-10-CM | POA: Diagnosis not present

## 2018-06-25 DIAGNOSIS — Z6829 Body mass index (BMI) 29.0-29.9, adult: Secondary | ICD-10-CM | POA: Diagnosis not present

## 2018-07-01 DIAGNOSIS — H6611 Chronic tubotympanic suppurative otitis media, right ear: Secondary | ICD-10-CM | POA: Diagnosis not present

## 2018-07-01 DIAGNOSIS — H8101 Meniere's disease, right ear: Secondary | ICD-10-CM | POA: Diagnosis not present

## 2018-07-01 DIAGNOSIS — H9042 Sensorineural hearing loss, unilateral, left ear, with unrestricted hearing on the contralateral side: Secondary | ICD-10-CM | POA: Diagnosis not present

## 2018-07-06 ENCOUNTER — Encounter (HOSPITAL_BASED_OUTPATIENT_CLINIC_OR_DEPARTMENT_OTHER): Payer: Self-pay

## 2018-07-06 ENCOUNTER — Other Ambulatory Visit: Payer: Self-pay

## 2018-07-06 ENCOUNTER — Emergency Department (HOSPITAL_BASED_OUTPATIENT_CLINIC_OR_DEPARTMENT_OTHER): Payer: 59

## 2018-07-06 ENCOUNTER — Emergency Department (HOSPITAL_BASED_OUTPATIENT_CLINIC_OR_DEPARTMENT_OTHER)
Admission: EM | Admit: 2018-07-06 | Discharge: 2018-07-06 | Disposition: A | Discharge status: Home / Self Care | Payer: 59 | Attending: Emergency Medicine | Admitting: Emergency Medicine

## 2018-07-06 DIAGNOSIS — N132 Hydronephrosis with renal and ureteral calculous obstruction: Secondary | ICD-10-CM | POA: Diagnosis not present

## 2018-07-06 DIAGNOSIS — I10 Essential (primary) hypertension: Secondary | ICD-10-CM | POA: Insufficient documentation

## 2018-07-06 DIAGNOSIS — R109 Unspecified abdominal pain: Secondary | ICD-10-CM | POA: Diagnosis present

## 2018-07-06 DIAGNOSIS — M791 Myalgia, unspecified site: Secondary | ICD-10-CM | POA: Diagnosis not present

## 2018-07-06 DIAGNOSIS — Z79899 Other long term (current) drug therapy: Secondary | ICD-10-CM | POA: Diagnosis not present

## 2018-07-06 DIAGNOSIS — R110 Nausea: Secondary | ICD-10-CM | POA: Diagnosis not present

## 2018-07-06 DIAGNOSIS — N23 Unspecified renal colic: Secondary | ICD-10-CM | POA: Insufficient documentation

## 2018-07-06 LAB — URINALYSIS, MICROSCOPIC (REFLEX)

## 2018-07-06 LAB — URINALYSIS, ROUTINE W REFLEX MICROSCOPIC
BILIRUBIN URINE: NEGATIVE
GLUCOSE, UA: NEGATIVE mg/dL
HGB URINE DIPSTICK: NEGATIVE
Ketones, ur: NEGATIVE mg/dL
Nitrite: NEGATIVE
Protein, ur: NEGATIVE mg/dL
Specific Gravity, Urine: 1.025 (ref 1.005–1.030)
pH: 6 (ref 5.0–8.0)

## 2018-07-06 MED ORDER — HYDROMORPHONE HCL 1 MG/ML IJ SOLN
0.5000 mg | Freq: Once | INTRAMUSCULAR | Status: AC
  Administered 2018-07-06: 0.5 mg via INTRAVENOUS
  Filled 2018-07-06: qty 1

## 2018-07-06 MED ORDER — ONDANSETRON HCL 4 MG/2ML IJ SOLN
4.0000 mg | Freq: Once | INTRAMUSCULAR | Status: AC
  Administered 2018-07-06: 4 mg via INTRAVENOUS
  Filled 2018-07-06: qty 2

## 2018-07-06 MED ORDER — OXYCODONE-ACETAMINOPHEN 5-325 MG PO TABS: 1.0000 | ORAL_TABLET | ORAL | 0 refills | Status: DC | PRN

## 2018-07-06 MED ORDER — KETOROLAC TROMETHAMINE 30 MG/ML IJ SOLN
30.0000 mg | Freq: Once | INTRAMUSCULAR | Status: AC
  Administered 2018-07-06: 30 mg via INTRAVENOUS
  Filled 2018-07-06: qty 1

## 2018-07-06 MED ORDER — ONDANSETRON HCL 4 MG PO TABS: 4.0000 mg | ORAL_TABLET | Freq: Four times a day (QID) | ORAL | 0 refills | Status: DC | PRN

## 2018-07-06 MED ORDER — TAMSULOSIN HCL 0.4 MG PO CAPS: 0.4000 mg | ORAL_CAPSULE | Freq: Every day | ORAL | 0 refills | Status: DC

## 2018-07-06 NOTE — ED Provider Notes (Signed)
[facility] Provider Note   CSN: [id number] Arrival date & time: 07/06/18  1925    History   Chief Complaint No chief complaint on file.   HPI Laura Mills is a 58 y.o. female.     HPI With previous history of kidney stones states she had acute onset left-sided flank pain which started around 3 PM today.  Associated with nausea.  States the pain is been constant since onset.  States similar to previous pain related to kidney stones.  Has required lithotripsy and stent placement in the past.  Denies fever or chills.  No hematuria. Past Medical History:  Diagnosis Date  . Arthritis    LUMBAR SPINE AND RIGHT HIP  . GERD (gastroesophageal reflux disease)   . History of kidney stones   . History of kidney stones 10/2012  . Hyperlipidemia   . Hypertension   . Renal stone   . TMJ syndrome    LEFT     There are no active problems to display for this patient.   Past Surgical History:  Procedure Laterality Date  . ABDOMINAL HYSTERECTOMY    . CHOLECYSTECTOMY    . CHOLECYSTECTOMY  2009  . CYSTOSCOPY WITH URETEROSCOPY Right 10/02/2012   Procedure: CYSTOSCOPY WITH RIGHT RETROGRADE AND STENT PLACEMENT ;  Surgeon: Fredricka Bonine, MD;  Location: [facility];  Service: Urology;  Laterality: Right;  . CYSTOSCOPY WITH URETEROSCOPY Right 11/13/2012   Procedure: RIGHT URETEROSCOPY;  Surgeon: Fredricka Bonine, MD;  Location: [facility];  Service: Urology;  Laterality: Right;  with Stent  . LITHOTRIPSY  10/2012  . PUBOVAGINAL SLING N/A 04/15/2017   Procedure: Gaynelle Arabian;  Surgeon: Festus Aloe, MD;  Location: [facility];  Service: Urology;  Laterality: N/A;  NEEDS 90 MIN FOR PROCEDURE  . URETERAL STENT PLACEMENT       OB History   No obstetric history on file.      Home Medications    Prior to Admission medications   Medication Sig Start Date End Date Taking? Authorizing Provider  atorvastatin (LIPITOR) 10 MG tablet Take 10 mg by mouth  every evening.   Yes [provider]  calcium-vitamin D (OSCAL WITH D) 500-200 MG-UNIT tablet Take 1 tablet by mouth.   Yes [provider]  Cholecalciferol (VITAMIN D3) 50 MCG (2000 UT) TABS Take 1 tablet by mouth daily.   Yes [provider]  Cyanocobalamin (VITAMIN B-12 PO) Take 1 tablet by mouth daily.   Yes [provider]  methocarbamol (ROBAXIN) 500 MG tablet Take 500 mg by mouth every 8 (eight) hours as needed for muscle spasms.    Yes [provider]  metoprolol succinate (TOPROL-XL) 25 MG 24 hr tablet Take 25 mg by mouth every evening.   Yes [provider]  traMADol (ULTRAM) 50 MG tablet Take 50 mg by mouth every 8 (eight) hours as needed for moderate pain.   Yes [provider]  CALCIUM CARBONATE-VITAMIN D PO Take 1 tablet by mouth daily.     [provider]  Diclofenac Sodium (PENNSAID) 2 % SOLN Place 1 application onto the skin 2 (two) times daily as needed (for pain).    [provider]  estradiol (ESTRACE) 2 MG tablet Take 2 mg by mouth daily.     [provider]  fexofenadine (ALLEGRA) 180 MG tablet Take 180 mg by mouth daily.    [provider]  ondansetron (ZOFRAN) 4 MG tablet Take 1 tablet (4 mg total)  by mouth every 6 (six) hours as needed for nausea or vomiting. 07/06/18   Julianne Rice, MD  oxybutynin (DITROPAN XL) 15 MG 24 hr tablet Take 15 mg by mouth daily.    [provider]  oxyCODONE-acetaminophen (PERCOCET) 5-325 MG tablet Take 1-2 tablets by mouth every 4 (four) hours as needed. 07/06/18   Julianne Rice, MD  tamsulosin (FLOMAX) 0.4 MG CAPS capsule Take 1 capsule (0.4 mg total) by mouth daily. 07/06/18   Julianne Rice, MD  triamterene-hydrochlorothiazide (DYAZIDE) 37.5-25 MG capsule Take 1 capsule by mouth daily.    [provider]  TURMERIC PO Take 1 capsule by mouth daily.    [provider]  valACYclovir (VALTREX) 500 MG tablet Take 500  mg by mouth daily.     [provider]    Family History Family History  Problem Relation Age of Onset  . Colon cancer Neg Hx   . Esophageal cancer Neg Hx   . Rectal cancer Neg Hx   . Stomach cancer Neg Hx     Social History Social History   Tobacco Use  . Smoking status: Never Smoker  . Smokeless tobacco: Never Used  Substance Use Topics  . Alcohol use: No  . Drug use: No     Allergies   Ciprofloxacin; Macrodantin [nitrofurantoin]; and Minocin [minocycline hcl]   Review of Systems Review of Systems  Constitutional: Negative for chills and fever.  Respiratory: Negative for shortness of breath.   Cardiovascular: Negative for chest pain.  Gastrointestinal: Positive for nausea. Negative for abdominal pain, constipation, diarrhea and vomiting.  Genitourinary: Positive for flank pain. Negative for difficulty urinating, dysuria, frequency and hematuria.  Musculoskeletal: Positive for back pain and myalgias.  Skin: Negative for rash and wound.  Neurological: Negative for dizziness, weakness, light-headedness, numbness and headaches.  All other systems reviewed and are negative.    Physical Exam Updated Vital Signs BP 118/71 (BP Location: Right Arm)   Pulse 76   Temp 98.3 F (36.8 C) (Oral)   Resp 20   SpO2 98%   Physical Exam Vitals signs and nursing note reviewed.  Constitutional:      General: She is in acute distress.     Appearance: She is well-developed.  HENT:     Head: Normocephalic and atraumatic.  Eyes:     Pupils: Pupils are equal, round, and reactive to light.  Neck:     Musculoskeletal: Normal range of motion and neck supple.  Cardiovascular:     Rate and Rhythm: Normal rate and regular rhythm.  Pulmonary:     Effort: Pulmonary effort is normal. No respiratory distress.     Breath sounds: Normal breath sounds. No stridor. No wheezing, rhonchi or rales.  Chest:     Chest wall: No tenderness.  Abdominal:     General: Bowel sounds are  normal. There is distension.     Palpations: Abdomen is soft.     Tenderness: There is no abdominal tenderness. There is left CVA tenderness. There is no guarding or rebound.     Hernia: No hernia is present.  Musculoskeletal: Normal range of motion.        General: No swelling, tenderness, deformity or signs of injury.     Right lower leg: No edema.     Left lower leg: No edema.  Skin:    General: Skin is warm and dry.     Findings: No erythema or rash.  Neurological:     General: No focal deficit present.  Mental Status: She is alert and oriented to person, place, and time.  Psychiatric:        Behavior: Behavior normal.      ED Treatments / Results  Labs (all labs ordered are listed, but only abnormal results are displayed) Labs Reviewed  URINALYSIS, ROUTINE W REFLEX MICROSCOPIC - Abnormal; Notable for the following components:      Result Value   Leukocytes,Ua SMALL (*)    All other components within normal limits  URINALYSIS, MICROSCOPIC (REFLEX) - Abnormal; Notable for the following components:   Bacteria, UA FEW (*)    All other components within normal limits    EKG None  Radiology Ct Renal Stone Study  Result Date: 07/06/2018 CLINICAL DATA:  Left flank pain and spasms this afternoon with some nausea. Suspect renal stones. EXAM: CT ABDOMEN AND PELVIS WITHOUT CONTRAST TECHNIQUE: Multidetector CT imaging of the abdomen and pelvis was performed following the standard protocol without IV contrast. COMPARISON:  12/16/2016 and 12/31/2011 FINDINGS: Lower chest: Lung bases are normal. Hepatobiliary: Previous cholecystectomy. Liver and biliary tree are normal. Pancreas: Mild ill-defined low-attenuation over the pancreatic head. No adjacent inflammatory change or fluid. Unremarkable common bile duct and main pancreatic duct. Spleen: Normal. Adrenals/Urinary Tract: Adrenal glands are normal. Kidneys are normal in size. Single 2-3 mm stone over the lower pole right kidney. No  definite left renal stones. There is mild left-sided hydronephrosis. 3-4 mm stone over the distal left ureter just above the UVJ. No right-sided hydronephrosis. Right ureter and bladder are normal. Stomach/Bowel: Stomach and small bowel are normal. Appendix is normal. Mild diverticulosis of the colon. Vascular/Lymphatic: Very minimal calcified plaque over the abdominal aorta. No adenopathy. Reproductive: Previous hysterectomy. Other: No free fluid or focal inflammatory change. Musculoskeletal: Mild degenerative change of the spine with curvature of the lumbar spine convex left. IMPRESSION: Right-sided nephrolithiasis. Mild left-sided hydronephrosis with a 3-4 mm stone over the distal left ureter just above the UVJ causing this low-grade obstruction. Mild ill-defined low-attenuation over the pancreatic head on this noncontrast study. Recommend further evaluation with contrast-enhanced CT to exclude a mass. Mild colonic diverticulosis. Aortic Atherosclerosis (ICD10-I70.0). Electronically Signed   By: Marin Olp M.D.   On: 07/06/2018 20:36    Procedures Procedures (including critical care time)  Medications Ordered in ED Medications  HYDROmorphone (DILAUDID) injection 0.5 mg (0.5 mg Intravenous Given 07/06/18 1955)  ondansetron (ZOFRAN) injection 4 mg (4 mg Intravenous Given 07/06/18 1955)  ketorolac (TORADOL) 30 MG/ML injection 30 mg (30 mg Intravenous Given 07/06/18 1955)     Initial Impression / Assessment and Plan / ED Course  I have reviewed the triage vital signs and the nursing notes.  Pertinent labs & imaging results that were available during my care of the patient were reviewed by me and considered in my medical decision making (see chart for details).       Patient is now pain-free.  Distal ureteral stone on the left.  Advised to follow-up with her urologist as needed.  Return precautions given.   Final Clinical Impressions(s) / ED Diagnoses   Final diagnoses:  Renal colic on left  side    ED Discharge Orders         Ordered    oxyCODONE-acetaminophen (PERCOCET) 5-325 MG tablet  Every 4 hours PRN     07/06/18 2104    ondansetron (ZOFRAN) 4 MG tablet  Every 6 hours PRN     07/06/18 2104    tamsulosin (FLOMAX) 0.4 MG CAPS capsule  Daily  07/06/18 2104           Julianne Rice, MD 07/06/18 2241

## 2018-07-06 NOTE — ED Triage Notes (Signed)
C/o left side abd/flank pain x 3-4 hours-states feels like kidney stone-NAD-steady gait

## 2018-07-06 NOTE — ED Notes (Signed)
ED Provider at bedside. 

## 2018-07-07 DIAGNOSIS — H8101 Meniere's disease, right ear: Secondary | ICD-10-CM | POA: Diagnosis not present

## 2018-07-07 DIAGNOSIS — H6611 Chronic tubotympanic suppurative otitis media, right ear: Secondary | ICD-10-CM | POA: Diagnosis not present

## 2018-07-07 DIAGNOSIS — H9042 Sensorineural hearing loss, unilateral, left ear, with unrestricted hearing on the contralateral side: Secondary | ICD-10-CM | POA: Diagnosis not present

## 2018-07-08 DIAGNOSIS — H6611 Chronic tubotympanic suppurative otitis media, right ear: Secondary | ICD-10-CM | POA: Diagnosis not present

## 2018-07-08 DIAGNOSIS — H8101 Meniere's disease, right ear: Secondary | ICD-10-CM | POA: Diagnosis not present

## 2018-07-08 DIAGNOSIS — H9042 Sensorineural hearing loss, unilateral, left ear, with unrestricted hearing on the contralateral side: Secondary | ICD-10-CM | POA: Diagnosis not present

## 2018-07-09 ENCOUNTER — Other Ambulatory Visit: Payer: Self-pay | Admitting: Otolaryngology

## 2018-07-09 DIAGNOSIS — H60391 Other infective otitis externa, right ear: Secondary | ICD-10-CM

## 2018-07-09 DIAGNOSIS — H8101 Meniere's disease, right ear: Secondary | ICD-10-CM

## 2018-07-10 ENCOUNTER — Other Ambulatory Visit: Payer: Self-pay | Admitting: Otolaryngology

## 2018-07-10 DIAGNOSIS — H7091 Unspecified mastoiditis, right ear: Secondary | ICD-10-CM

## 2018-07-10 DIAGNOSIS — H60391 Other infective otitis externa, right ear: Secondary | ICD-10-CM

## 2018-07-10 DIAGNOSIS — H8101 Meniere's disease, right ear: Secondary | ICD-10-CM

## 2018-07-13 ENCOUNTER — Ambulatory Visit
Admission: RE | Admit: 2018-07-13 | Discharge: 2018-07-13 | Disposition: A | Discharge status: Home / Self Care | Payer: 59 | Source: Ambulatory Visit | Attending: Otolaryngology | Admitting: Otolaryngology

## 2018-07-13 ENCOUNTER — Other Ambulatory Visit: Payer: Self-pay

## 2018-07-13 DIAGNOSIS — H7091 Unspecified mastoiditis, right ear: Secondary | ICD-10-CM

## 2018-07-13 DIAGNOSIS — H60391 Other infective otitis externa, right ear: Secondary | ICD-10-CM | POA: Diagnosis not present

## 2018-07-13 DIAGNOSIS — H8101 Meniere's disease, right ear: Secondary | ICD-10-CM

## 2018-07-13 IMAGING — CT CT TEMPORAL BONES WITH CONTRAST
1 series · 15 of 30 positions shown, 19 images · IV contrast (iopamidol)
Comparison: [DATE]

CLINICAL DATA: Infection of the right external ear. Mastoiditis on
the right. Symptoms began in [REDACTED]. Methicillin-resistant Staph
aureus.

Creatinine was obtained on site at [HOSPITAL] at [HOSPITAL].
Results: Creatinine 0.7 mg/dL.
EXAM:
CT TEMPORAL BONES WITH CONTRAST
TECHNIQUE: Axial and coronal plane CT imaging of the petrous temporal bones was
performed with thin-collimation image reconstruction after
intravenous contrast administration. Multiplanar CT image
reconstructions were also generated.
CONTRAST:  75mL [TX] IOPAMIDOL ([TX]) INJECTION 61%

[Series 5: soft tissue · axial · 0.42mm/px · z∈[-174,-88]mm · 15 of 47 slices shown, 19 images]
[im 2/47  brain]
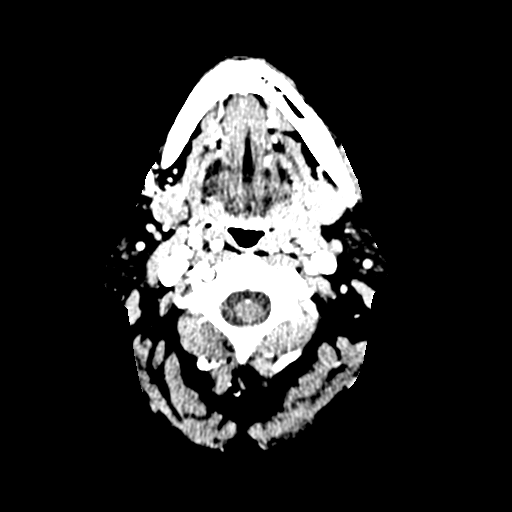
[im 2/47  bone]
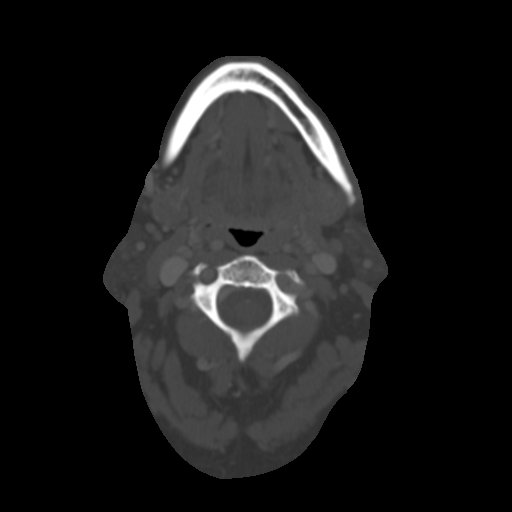
[im 5/47  bone]
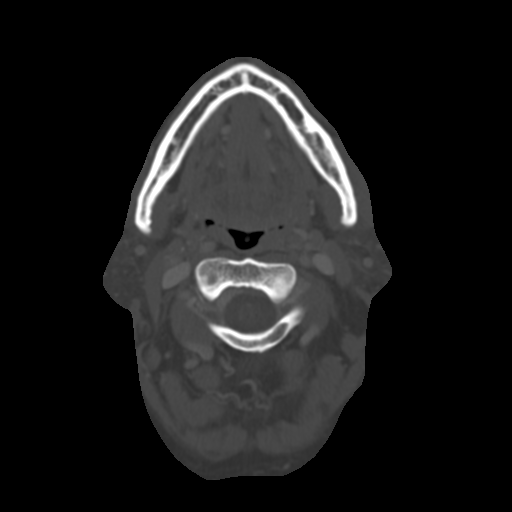
[im 8/47  bone]
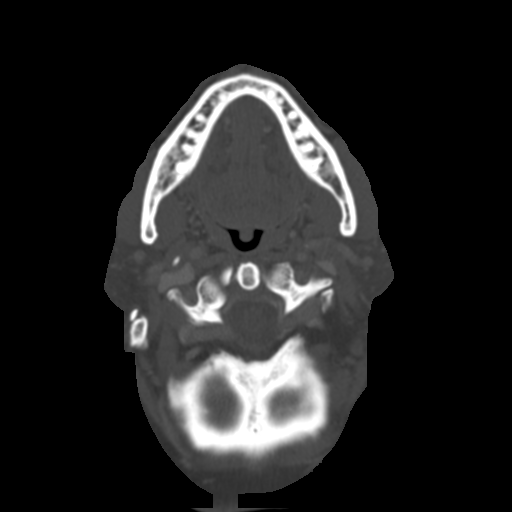
[im 12/47  bone]
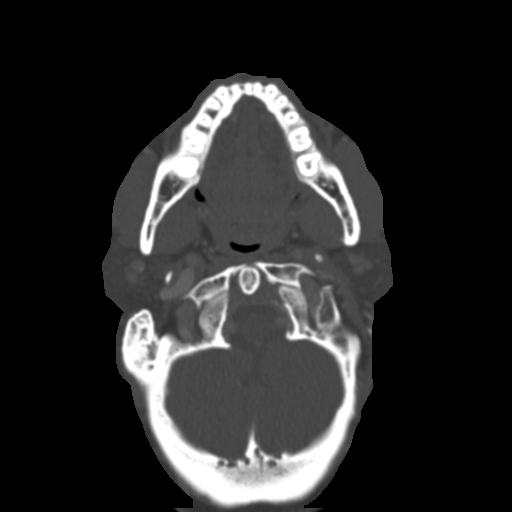
[im 15/47  brain]
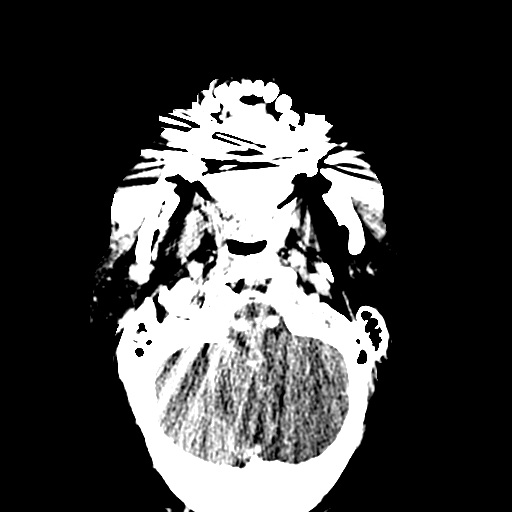
[im 15/47  bone]
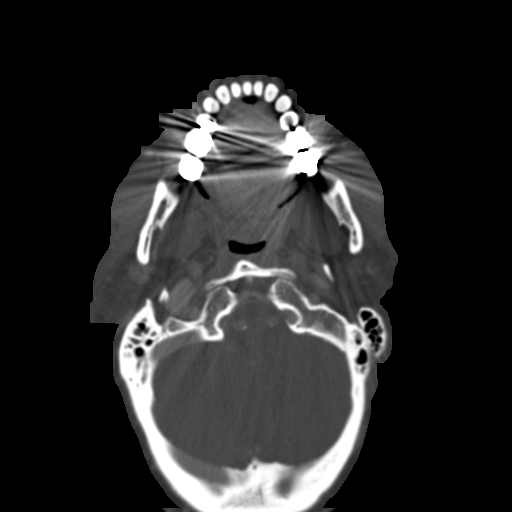
[im 18/47  bone]
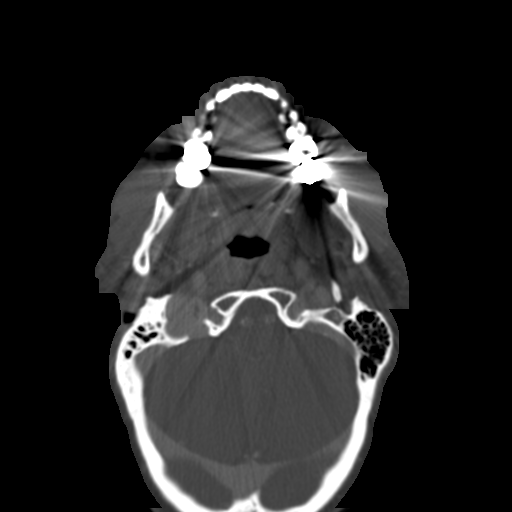
[im 21/47  bone]
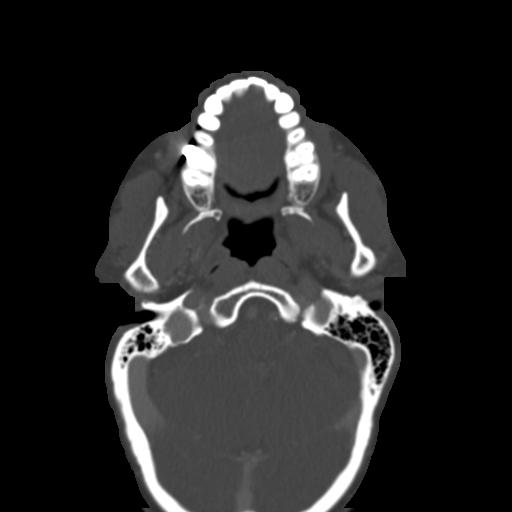
[im 24/47  bone]
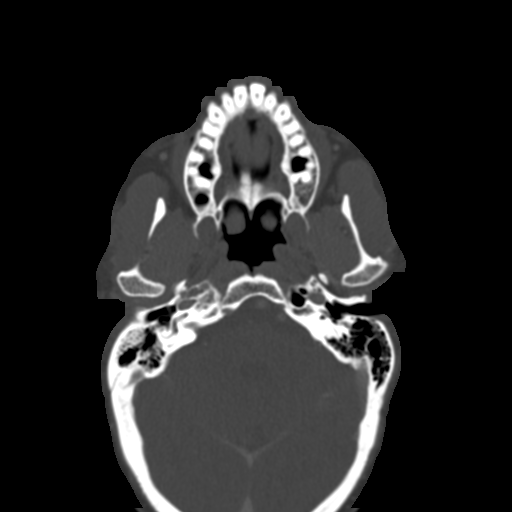
[im 26/47  brain]
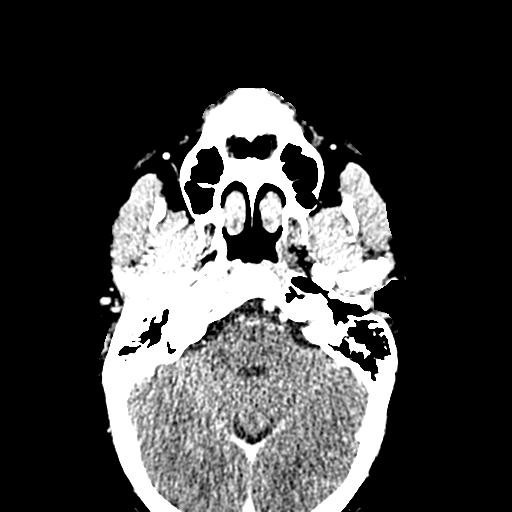
[im 26/47  bone]
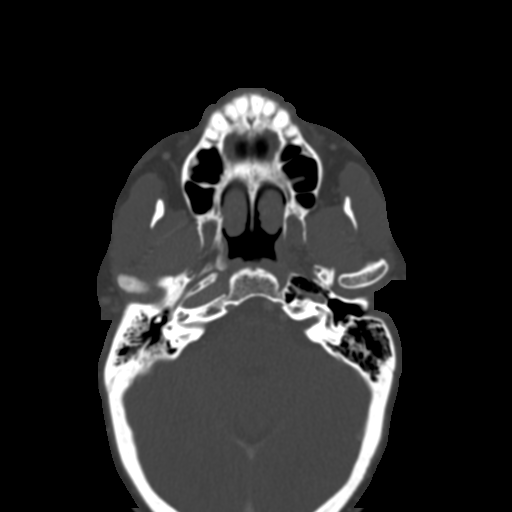
[im 29/47  bone]
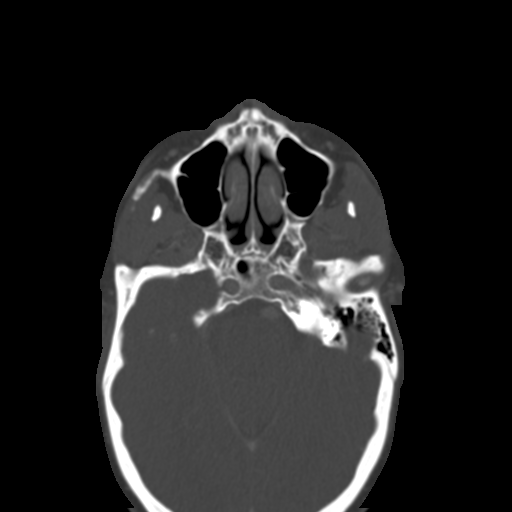
[im 32/47  bone]
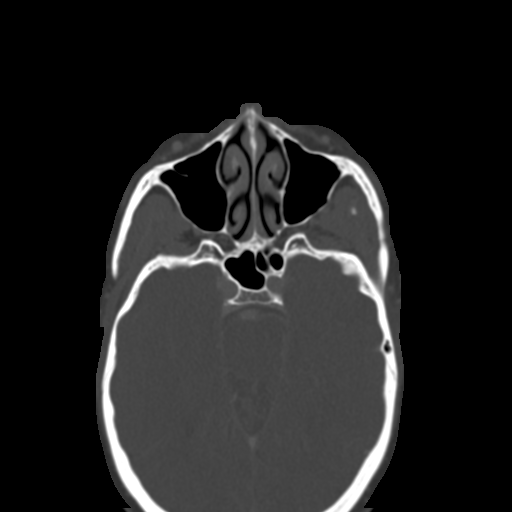
[im 35/47  bone]
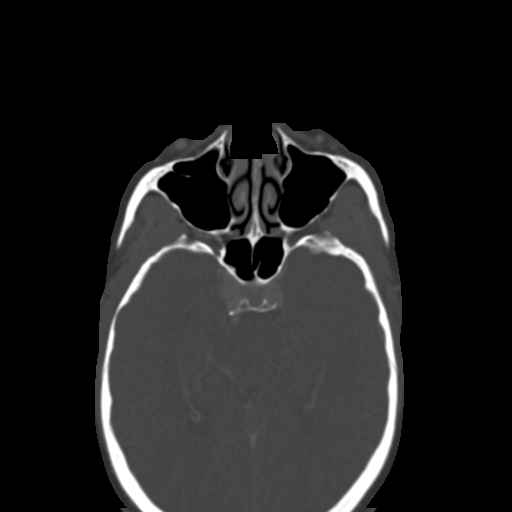
[im 39/47  brain]
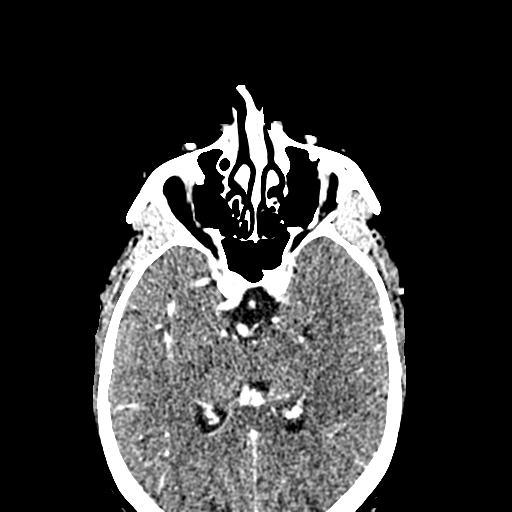
[im 39/47  bone]
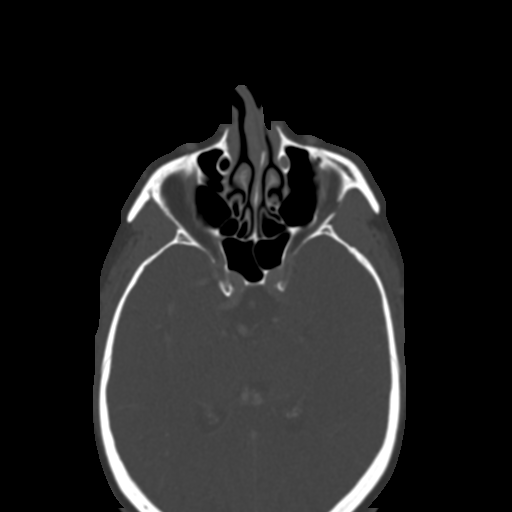
[im 42/47  bone]
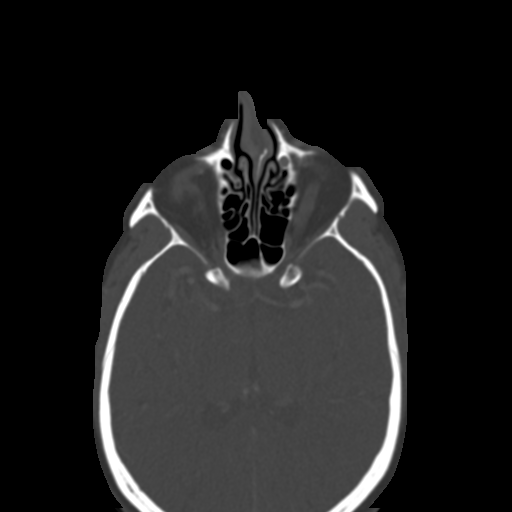
[im 45/47  bone]
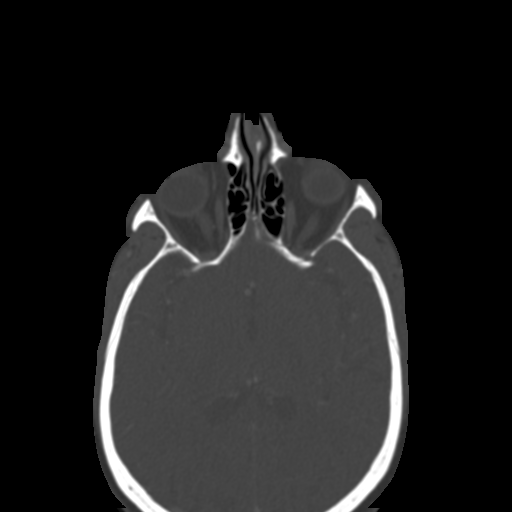

[15 of 30 positions shown; findings below may reference images not displayed]

FINDINGS: No significant change when compared to the study [DATE].
The left temporal bone, mastoid region, middle ear and inner ear
structures remain normal.

On the right, there is chronic sclerotic change of the mastoid
region with relatively poor developed air cells. There are some
opacified air cells at the mastoid tip, containing slightly more
fluid than was seen in [TX], but without dramatic change. No
evidence of bone destruction or coalescence. Probable myringotomy
tube on the right. Small amount fluid or tissue density material in
the middle ear, including in Prussak space, but without evidence of
definite erosion of the scutum or the ossicular chain. The attic
appears clear. Inner ear structures appear normal.
IMPRESSION: Chronic sclerotic pattern of the mastoid region on the right with
poorly developed air cells. Few opacified mastoid air cells
inferiorly, slightly more so than was seen in [TX]. No apparent
interval bone destruction or coalescence however. Myringotomy tube
on the right. Small amount fluid or tissue density material in the
middle ear, including small amount in Prussak space, but without
evidence of erosion of the scutum or ossicular chain.

## 2018-07-13 MED ORDER — IOPAMIDOL (ISOVUE-300) INJECTION 61%
75.0000 mL | Freq: Once | INTRAVENOUS | Status: AC | PRN
  Administered 2018-07-13: 16:00:00 75 mL via INTRAVENOUS

## 2018-07-20 DIAGNOSIS — H6611 Chronic tubotympanic suppurative otitis media, right ear: Secondary | ICD-10-CM | POA: Diagnosis not present

## 2018-07-20 DIAGNOSIS — H7011 Chronic mastoiditis, right ear: Secondary | ICD-10-CM | POA: Diagnosis not present

## 2018-07-20 DIAGNOSIS — H8101 Meniere's disease, right ear: Secondary | ICD-10-CM | POA: Diagnosis not present

## 2019-09-13 ENCOUNTER — Telehealth: Payer: Self-pay | Admitting: Physical Medicine and Rehabilitation

## 2019-09-13 NOTE — Telephone Encounter (Signed)
error 

## 2019-12-24 ENCOUNTER — Other Ambulatory Visit: Payer: Self-pay | Admitting: Obstetrics and Gynecology

## 2019-12-24 DIAGNOSIS — R0789 Other chest pain: Secondary | ICD-10-CM

## 2020-01-06 ENCOUNTER — Other Ambulatory Visit: Payer: 59

## 2022-03-31 ENCOUNTER — Encounter (HOSPITAL_BASED_OUTPATIENT_CLINIC_OR_DEPARTMENT_OTHER): Payer: Self-pay

## 2022-03-31 ENCOUNTER — Other Ambulatory Visit: Payer: Self-pay

## 2022-03-31 ENCOUNTER — Emergency Department (HOSPITAL_BASED_OUTPATIENT_CLINIC_OR_DEPARTMENT_OTHER): Payer: 59

## 2022-03-31 ENCOUNTER — Emergency Department (HOSPITAL_BASED_OUTPATIENT_CLINIC_OR_DEPARTMENT_OTHER)
Admission: EM | Admit: 2022-03-31 | Discharge: 2022-03-31 | Disposition: A | Discharge status: Home / Self Care | Payer: 59 | Attending: Emergency Medicine | Admitting: Emergency Medicine

## 2022-03-31 DIAGNOSIS — Z79899 Other long term (current) drug therapy: Secondary | ICD-10-CM | POA: Insufficient documentation

## 2022-03-31 DIAGNOSIS — N200 Calculus of kidney: Secondary | ICD-10-CM | POA: Diagnosis not present

## 2022-03-31 DIAGNOSIS — R109 Unspecified abdominal pain: Secondary | ICD-10-CM | POA: Diagnosis present

## 2022-03-31 DIAGNOSIS — I10 Essential (primary) hypertension: Secondary | ICD-10-CM | POA: Diagnosis not present

## 2022-03-31 LAB — CBC
HCT: 44.1 % (ref 36.0–46.0)
Hemoglobin: 14.8 g/dL (ref 12.0–15.0)
MCH: 28.6 pg (ref 26.0–34.0)
MCHC: 33.6 g/dL (ref 30.0–36.0)
MCV: 85.1 fL (ref 80.0–100.0)
Platelets: 226 10*3/uL (ref 150–400)
RBC: 5.18 MIL/uL — ABNORMAL HIGH (ref 3.87–5.11)
RDW: 12.8 % (ref 11.5–15.5)
WBC: 11.5 10*3/uL — ABNORMAL HIGH (ref 4.0–10.5)
nRBC: 0 % (ref 0.0–0.2)

## 2022-03-31 LAB — URINALYSIS, ROUTINE W REFLEX MICROSCOPIC
Bilirubin Urine: NEGATIVE
Glucose, UA: NEGATIVE mg/dL
Ketones, ur: 15 mg/dL — AB
Leukocytes,Ua: NEGATIVE
Nitrite: NEGATIVE
Specific Gravity, Urine: 1.03 (ref 1.005–1.030)
pH: 5.5 (ref 5.0–8.0)

## 2022-03-31 LAB — BASIC METABOLIC PANEL
Anion gap: 12 (ref 5–15)
BUN: 17 mg/dL (ref 8–23)
CO2: 23 mmol/L (ref 22–32)
Calcium: 9.5 mg/dL (ref 8.9–10.3)
Chloride: 106 mmol/L (ref 98–111)
Creatinine, Ser: 0.87 mg/dL (ref 0.44–1.00)
GFR, Estimated: >60 mL/min (ref >60)
Glucose, Bld: 95 mg/dL (ref 70–99)
Potassium: 4.2 mmol/L (ref 3.5–5.1)
Sodium: 141 mmol/L (ref 135–145)

## 2022-03-31 MED ORDER — HYDROCODONE-ACETAMINOPHEN 5-325 MG PO TABS: 2.0000 | ORAL_TABLET | Freq: Four times a day (QID) | ORAL | 0 refills | Status: AC | PRN

## 2022-03-31 MED ORDER — HYDROMORPHONE HCL 1 MG/ML IJ SOLN
1.0000 mg | Freq: Once | INTRAMUSCULAR | Status: AC
  Administered 2022-03-31: 1 mg via INTRAVENOUS
  Filled 2022-03-31: qty 1

## 2022-03-31 MED ORDER — TAMSULOSIN HCL 0.4 MG PO CAPS: 0.4000 mg | ORAL_CAPSULE | Freq: Every day | ORAL | 0 refills | Status: DC

## 2022-03-31 MED ORDER — ONDANSETRON HCL 4 MG/2ML IJ SOLN
4.0000 mg | Freq: Once | INTRAMUSCULAR | Status: AC
  Administered 2022-03-31: 4 mg via INTRAVENOUS
  Filled 2022-03-31: qty 2

## 2022-03-31 MED ORDER — ONDANSETRON 4 MG PO TBDP: 4.0000 mg | ORAL_TABLET | Freq: Three times a day (TID) | ORAL | 0 refills | Status: DC | PRN

## 2022-03-31 MED ORDER — METOCLOPRAMIDE HCL 5 MG/ML IJ SOLN
10.0000 mg | Freq: Once | INTRAMUSCULAR | Status: AC
  Administered 2022-03-31: 10 mg via INTRAVENOUS
  Filled 2022-03-31: qty 2

## 2022-03-31 MED ORDER — FENTANYL CITRATE PF 50 MCG/ML IJ SOSY
50.0000 ug | PREFILLED_SYRINGE | INTRAMUSCULAR | Status: AC | PRN
  Administered 2022-03-31 (×2): 50 ug via INTRAVENOUS
  Filled 2022-03-31 (×2): qty 1

## 2022-03-31 MED ORDER — SODIUM CHLORIDE 0.9 % IV BOLUS
1000.0000 mL | Freq: Once | INTRAVENOUS | Status: AC
  Administered 2022-03-31: 1000 mL via INTRAVENOUS

## 2022-03-31 NOTE — ED Triage Notes (Signed)
Pt presents POV from home, pt c/o Right flank pain, starting at 0830 this am. Pt reports a hx of kidney stones and feels this is the same

## 2022-03-31 NOTE — ED Notes (Signed)
Patient returned from CT

## 2022-03-31 NOTE — ED Provider Notes (Signed)
[place] EMERGENCY DEPT Provider Note   CSN: [id number] Arrival date & time: 03/31/22  1250     History  Chief Complaint  Patient presents with   Nephrolithiasis    Laura Mills is a 61 y.o. female.  With history of kidney stones, arthritis, hypertension who presents to the ED for evaluation of right-sided flank pain.  Symptoms started 2 days ago but were intermittent.  She states that today at approximately 8:30 AM they became constant.  Pain is on the right flank with radiation to the right groin.  Feels the exact same as when she has had kidney stones in the past.  Rates the pain as severe.  Took a tramadol at home with no relief.  Denies fevers, chills, dysuria, frequency, urgency.  Has some nausea without emesis.  Was given fentanyl and Zofran in triage and is still complaining of intense pain and mild nausea.  Has had numerous kidney stones in the past.  Follows Dr. Junious Silk with urology for this.  HPI     Home Medications Prior to Admission medications   Medication Sig Start Date End Date Taking? Authorizing Provider  HYDROcodone-acetaminophen (NORCO/VICODIN) 5-325 MG tablet Take 2 tablets by mouth every 6 (six) hours as needed. 03/31/22  Yes Dorita Rowlands, Graft[place] Folk, PA-C  ondansetron (ZOFRAN-ODT) 4 MG disintegrating tablet Take 1 tablet (4 mg total) by mouth every 8 (eight) hours as needed for nausea or vomiting. 03/31/22  Yes Lennis Korb, Grafton Folk, PA-C  tamsulosin (FLOMAX) 0.4 MG CAPS capsule Take 1 capsule (0.4 mg total) by mouth daily. 03/31/22  Yes Kalep Full, Grafton Folk, PA-C  atorvastatin (LIPITOR) 10 MG tablet Take 10 mg by mouth every evening.    [provider]  CALCIUM CARBONATE-VITAMIN D PO Take 1 tablet by mouth daily.     [provider]  calcium-vitamin D (OSCAL WITH D) 500-200 MG-UNIT tablet Take 1 tablet by mouth.    [provider]  Cholecalciferol (VITAMIN D3) 50 MCG (2000 UT) TABS Take 1 tablet by mouth daily.     [provider]  Cyanocobalamin (VITAMIN B-12 PO) Take 1 tablet by mouth daily.    [provider]  Diclofenac Sodium (PENNSAID) 2 % SOLN Place 1 application onto the skin 2 (two) times daily as needed (for pain).    [provider]  estradiol (ESTRACE) 2 MG tablet Take 2 mg by mouth daily.     [provider]  fexofenadine (ALLEGRA) 180 MG tablet Take 180 mg by mouth daily.    [provider]  methocarbamol (ROBAXIN) 500 MG tablet Take 500 mg by mouth every 8 (eight) hours as needed for muscle spasms.     [provider]  metoprolol succinate (TOPROL-XL) 25 MG 24 hr tablet Take 25 mg by mouth every evening.    [provider]  ondansetron (ZOFRAN) 4 MG tablet Take 1 tablet (4 mg total) by mouth every 6 (six) hours as needed for nausea or vomiting. 07/06/18   Julianne Rice, MD  oxybutynin (DITROPAN XL) 15 MG 24 hr tablet Take 15 mg by mouth daily.    [provider]  oxyCODONE-acetaminophen (PERCOCET) 5-325 MG tablet Take 1-2 tablets by mouth every 4 (four) hours as needed. 07/06/18   Julianne Rice, MD  traMADol (ULTRAM) 50 MG tablet Take 50 mg by mouth every 8 (eight) hours as needed for moderate pain.    [provider]  triamterene-hydrochlorothiazide (DYAZIDE) 37.5-25 MG capsule Take 1 capsule by mouth daily.  [provider]  TURMERIC PO Take 1 capsule by mouth daily.    [provider]  valACYclovir (VALTREX) 500 MG tablet Take 500 mg by mouth daily.     [provider]      Allergies    Ciprofloxacin, Macrodantin [nitrofurantoin], and Minocin [minocycline hcl]    Review of Systems   Review of Systems  Genitourinary:  Positive for flank pain.  All other systems reviewed and are negative.   Physical Exam Updated Vital Signs BP 128/69   Pulse 88   Temp 98 F (36.7 C) (Oral)   Resp 13   SpO2 90%  Physical Exam Vitals and nursing note reviewed.  Constitutional:       General: She is in acute distress.     Appearance: Normal appearance. She is well-developed and normal weight. She is not ill-appearing, toxic-appearing or diaphoretic.  HENT:     Head: Normocephalic and atraumatic.  Eyes:     Conjunctiva/sclera: Conjunctivae normal.  Cardiovascular:     Rate and Rhythm: Normal rate and regular rhythm.     Heart sounds: No murmur heard. Pulmonary:     Effort: Pulmonary effort is normal. No respiratory distress.     Breath sounds: Normal breath sounds. No stridor. No wheezing or rhonchi.  Abdominal:     Palpations: Abdomen is soft.     Tenderness: There is no abdominal tenderness. There is no guarding.  Musculoskeletal:        General: No swelling.     Cervical back: Neck supple.  Skin:    General: Skin is warm and dry.     Capillary Refill: Capillary refill takes less than 2 seconds.  Neurological:     General: No focal deficit present.     Mental Status: She is alert and oriented to person, place, and time.  Psychiatric:        Mood and Affect: Mood normal.     ED Results / Procedures / Treatments   Labs (all labs ordered are listed, but only abnormal results are displayed) Labs Reviewed  CBC - Abnormal; Notable for the following components:      Result Value   WBC 11.5 (*)    RBC 5.18 (*)    All other components within normal limits  URINALYSIS, ROUTINE W REFLEX MICROSCOPIC - Abnormal; Notable for the following components:   Hgb urine dipstick MODERATE (*)    Ketones, ur 15 (*)    Protein, ur TRACE (*)    Bacteria, UA RARE (*)    All other components within normal limits  BASIC METABOLIC PANEL    EKG None  Radiology CT Renal Stone Study  Result Date: 03/31/2022 CLINICAL DATA:  Abdominal pain, flank pain EXAM: CT ABDOMEN AND PELVIS WITHOUT CONTRAST TECHNIQUE: Multidetector CT imaging of the abdomen and pelvis was performed following the standard protocol without IV contrast. RADIATION DOSE REDUCTION: This exam was performed  according to the departmental dose-optimization program which includes automated exposure control, adjustment of the mA and/or kV according to patient size and/or use of iterative reconstruction technique. COMPARISON:  07/06/2018 FINDINGS: Lower chest: Visualized lower lung fields are unremarkable. Hepatobiliary: Surgical clips are seen in gallbladder fossa. There is no dilation of bile ducts. Pancreas: No focal abnormalities are seen. Spleen: Unremarkable. Adrenals/Urinary Tract: Adrenals are unremarkable. There is mild to moderate right hydronephrosis. There is 6 mm calculus in the proximal course of right ureter at L4-L5 level. There is right perinephric stranding. There are no renal stones. Urinary  bladder is almost completely empty limiting evaluation. Stomach/Bowel: Small hiatal hernia is seen. Small bowel loops are not dilated. Appendix is difficult to visualize. In image 63 of series 2, there is a small caliber structure adjacent to the cecum, possibly normal appendix. There is no pericecal inflammation. There is no significant wall thickening in colon. Scattered diverticula are seen in colon without signs of focal acute diverticulitis. Vascular/Lymphatic: Scattered calcifications are seen in aorta and its major branches. Reproductive: Uterus is not seen. Other: There is no ascites or pneumoperitoneum. Small umbilical hernia containing fat is seen. Left inguinal hernia containing fat is seen. Musculoskeletal: Degenerative changes are noted in lumbar spine with spinal stenosis and encroachment of neural foramina from L3-S1 levels. There is also disc space narrowing at L1-L2 level. IMPRESSION: There is 6 mm calculus in the proximal right ureter causing mild to moderate right hydronephrosis. There is right perinephric stranding which may be due to ureteric obstruction or superimposed pyelonephritis. There is no evidence of intestinal obstruction or pneumoperitoneum. Small hiatal hernia. Diverticulosis of colon.  Lumbar spondylosis. Other findings as described in the body of the report. Electronically Signed   By: Elmer Picker M.D.   On: 03/31/2022 14:54    Procedures Procedures    Medications Ordered in ED Medications  fentaNYL (SUBLIMAZE) injection 50 mcg (50 mcg Intravenous Given 03/31/22 1452)  ondansetron (ZOFRAN) injection 4 mg (4 mg Intravenous Given 03/31/22 1418)  sodium chloride 0.9 % bolus 1,000 mL (0 mLs Intravenous Stopped 03/31/22 1707)  HYDROmorphone (DILAUDID) injection 1 mg (1 mg Intravenous Given 03/31/22 1540)  HYDROmorphone (DILAUDID) injection 1 mg (1 mg Intravenous Given 03/31/22 1735)  ondansetron (ZOFRAN) injection 4 mg (4 mg Intravenous Given 03/31/22 1735)  metoCLOPramide (REGLAN) injection 10 mg (10 mg Intravenous Given 03/31/22 1813)    ED Course/ Medical Decision Making/ A&P Clinical Course as of 03/31/22 1839  Sun Mar 31, 2022  1748 Pain and nausea are well controlled. Patient requesting one more dose prior to discharge. [AS]    Clinical Course User Index [AS] Shian Goodnow, Grafton Folk, PA-C                           Medical Decision Making Amount and/or Complexity of Data Reviewed Labs: ordered. Radiology: ordered.  Risk Prescription drug management.  This patient presents to the ED for concern of right-sided flank pain, this involves an extensive number of treatment options, and is a complaint that carries with it a high risk of complications and morbidity.  The differential diagnosis of emergent flank pain includes, but is not limited to :Abdominal aortic aneurysm,, Renal artery embolism,Renal vein thrombosis, Aortic dissection, Mesenteric ischemia, Pyelonephritis, Renal infarction, Renal hemorrhage, Nephrolithiasis/ Renal Colic, Bladder tumor,Cystitis, Biliary colic, Pancreatitis Perforated peptic ulcer Appendicitis ,Inguinal Hernia, Diverticulitis, Bowel obstruction Ectopic Pregnancy,PID/TOA,Ovarian cyst, Ovarian torsion Shingles Lower lobe pneumonia,  Retroperitoneal hematoma/abscess/tumor, Epidural abscess, Epidural hematoma    Co morbidities that complicate the patient evaluation   kidney stones, arthritis, hypertension  My initial workup includes basic labs, CT stone study, urinalysis, pain control, nausea control, IV fluids, EKG  Additional history obtained from: Nursing notes from this visit.  I ordered, reviewed and interpreted labs which include: BMP, CBC, urinalysis  I ordered imaging studies including CT stone study I independently visualized and interpreted imaging which showed 6 mm stone in the right proximal ureter with mild hydronephrosis I agree with the radiologist interpretation  Afebrile, hemodynamically stable.  61 year old female presenting to the ED  for evaluation of right-sided flank pain.  States it feels exactly same as kidney stones that she has had in the past.  Physical exam is remarkable for patient presents mild to moderate distress.  There is no CVA tenderness on either side.  There is no evidence of infection on urinalysis.  She does have a slight leukocytosis of 11.5 which is likely reactive to her pain as it has been going on for 2 days.  CT stone study reveals a 6 mm stone in the right proximal ureter.  Patient was given IV fluids, pain medication and nausea medication and she reported feeling better prior to discharge.  Has a significant history of kidney stones and follows with Dr. Junious Silk.  She states she will call his office to schedule an appointment for follow-up.  She was encouraged to continue drinking plenty of fluids.  She was given prescriptions for Zofran, Norco and Flomax.  She was given a strainer prior to discharge.  Was given return precautions.  Stable at discharge.  At this time there does not appear to be any evidence of an acute emergency medical condition and the patient appears stable for discharge with appropriate outpatient follow up. Diagnosis was discussed with patient who verbalizes  understanding of care plan and is agreeable to discharge. I have discussed return precautions with patient and husband who verbalizes understanding. Patient encouraged to follow-up with their PCP within 1 week. All questions answered.  Patient's case discussed with Dr. Langston Masker who agrees with plan to discharge with follow-up.   Note: Portions of this report may have been transcribed using voice recognition software. Every effort was made to ensure accuracy; however, inadvertent computerized transcription errors may still be present.         Final Clinical Impression(s) / ED Diagnoses Final diagnoses:  Kidney stone    Rx / DC Orders ED Discharge Orders          Ordered    HYDROcodone-acetaminophen (NORCO/VICODIN) 5-325 MG tablet  Every 6 hours PRN        03/31/22 1837    tamsulosin (FLOMAX) 0.4 MG CAPS capsule  Daily        03/31/22 1837    ondansetron (ZOFRAN-ODT) 4 MG disintegrating tablet  Every 8 hours PRN        03/31/22 1837              Nehemiah Massed 03/31/22 1840    Wyvonnia Dusky, MD 03/31/22 [phone]

## 2022-03-31 NOTE — ED Notes (Signed)
Patient transported to CT 

## 2022-03-31 NOTE — Discharge Instructions (Signed)
You have been seen today for your complaint of flank pain. Your lab work was reassuring and showed no significant abnormalities. Your imaging showed a 6 mm kidney stone in your right kidney. Your discharge medications include Norco. This is an opioid pain medication. You should only take this medication as needed for severe pain. You should not drive, operate heavy machinery or make important decisions while taking this medication. You should use alternative methods for pain relief while taking this medication including stretching, gentle range of motion, and alternating tylenol and ibuprofen. Zofran.  This is a nausea medication.  He should take it as needed for nausea. Flomax.  This is a medication to help you pass the kidney stone.  Take it as prescribed. Home care instructions are as follows:  Drink plenty of fluids until the stone passes.  You should use a strainer at home to confirm the stone has passed. Follow up with: Your urologist.  Please seek immediate medical care if you develop any of the following symptoms: You have a fever or chills. You develop severe pain. You develop new abdominal pain. You faint. You are unable to urinate. At this time there does not appear to be the presence of an emergent medical condition, however there is always the potential for conditions to change. Please read and follow the below instructions.  Do not take your medicine if  develop an itchy rash, swelling in your mouth or lips, or difficulty breathing; call 911 and seek immediate emergency medical attention if this occurs.  You may review your lab tests and imaging results in their entirety on your MyChart account.  Please discuss all results of fully with your primary care provider and other specialist at your follow-up visit.  Note: Portions of this text may have been transcribed using voice recognition software. Every effort was made to ensure accuracy; however, inadvertent computerized  transcription errors may still be present.

## 2022-03-31 NOTE — ED Notes (Signed)
Pt given 2 hot packs, 1 per flank area

## 2022-05-06 ENCOUNTER — Other Ambulatory Visit: Payer: Self-pay | Admitting: Urology

## 2022-05-16 NOTE — Patient Instructions (Signed)
DUE TO COVID-19 ONLY TWO VISITORS  (aged 62 and older)  ARE ALLOWED TO COME WITH YOU AND STAY IN THE WAITING ROOM ONLY DURING PRE OP AND PROCEDURE.   **NO VISITORS ARE ALLOWED IN THE SHORT STAY AREA OR RECOVERY ROOM!!**  IF YOU WILL BE ADMITTED INTO THE HOSPITAL YOU ARE ALLOWED ONLY FOUR SUPPORT PEOPLE DURING VISITATION HOURS ONLY (7 AM -8PM)   The support person(s) must pass our screening, gel in and out, and wear a mask at all times, including in the patient's room. Patients must also wear a mask when staff or their support person are in the room. Visitors GUEST BADGE MUST BE WORN VISIBLY  One adult visitor may remain with you overnight and MUST be in the room by 8 P.M.     Your procedure is scheduled on: 05/31/22   Report to [facility] Main Entrance    Report to admitting at  8:15 AM   Call this number if you have problems the morning of surgery [phone]   Do not eat food or drink:After Midnight.            If you have questions, please contact your surgeon's office.    Oral Hygiene is also important to reduce your risk of infection.                                    Remember - BRUSH YOUR TEETH THE MORNING OF SURGERY WITH YOUR REGULAR TOOTHPASTE   Take these medicines the morning of surgery with A SIP OF WATER: Pain med if needed                                                                                                                           Atorvastatin                                                                                                                           Metoprolol  Tamsulosin  .                              You may not have any metal on your body including hair pins, jewelry, and body piercing             Do not wear make-up, lotions, powders, perfumes/cologne, or deodorant  Do not wear nail polish including gel  and S&S, artificial/acrylic nails, or any other type of covering on natural nails including finger and toenails. If you have artificial nails, gel coating, etc. that needs to be removed by a nail salon please have this removed prior to surgery or surgery may need to be canceled/ delayed if the surgeon/ anesthesia feels like they are unable to be safely monitored.   Do not shave  48 hours prior to surgery.                  Do not bring valuables to the hospital. [place].   Contacts, glasses, or bridgework may not be worn into surgery.     DO NOT [place].     Patients discharged on the day of surgery will not be allowed to drive home.  Someone NEEDS to stay with you for the first 24 hours after anesthesia.   Special Instructions: Bring a copy of your healthcare power of attorney and living will documents the day of surgery if you haven't scanned them before.              Please read over the following fact sheets you were given: IF YOU HAVE QUESTIONS ABOUT YOUR PRE-OP INSTRUCTIONS PLEASE CALL [phone]    [facility] - Preparing for Surgery Before surgery, you can play an important role.  Because skin is not sterile, your skin needs to be as free of germs as possible.  You can reduce the number of germs on your skin by washing with CHG (chlorahexidine gluconate) soap before surgery.  CHG is an antiseptic cleaner which kills germs and bonds with the skin to continue killing germs even after washing. Please DO NOT use if you have an allergy to CHG or antibacterial soaps.  If your skin becomes reddened/irritated stop using the CHG and inform your nurse when you arrive at Short Stay. Do not shave (including legs and underarms) for at least 48 hours prior to the first CHG shower.   Please follow these instructions carefully:  1.  Shower with CHG Soap the night before surgery and the  morning of Surgery.  2.   If you choose to wash your hair, wash your hair first as usual with your  normal  shampoo.  3.  After you shampoo, rinse your hair and body thoroughly to remove the  shampoo.                            4.  Use CHG as you would any other liquid soap.  You can apply chg directly  to the skin and wash                       Gently with a scrungie or clean washcloth.  5.  Apply the CHG Soap to your body ONLY FROM THE NECK DOWN.  Do not use on face/ open                           Wound or open sores. Avoid contact with eyes, ears mouth and genitals (private parts).                       Wash face,  Genitals (private parts) with your normal soap.             6.  Wash thoroughly, paying special attention to the area where your surgery  will be performed.  7.  Thoroughly rinse your body with warm water from the neck down.  8.  DO NOT shower/wash with your normal soap after using and rinsing off  the CHG Soap.             9.  Pat yourself dry with a clean towel.            10.  Wear clean pajamas.            11.  Place clean sheets on your bed the night of your first shower and do not  sleep with pets. Day of Surgery : Do not apply any lotions/deodorants the morning of surgery.  Please wear clean clothes to the hospital/surgery center.  FAILURE TO FOLLOW THESE INSTRUCTIONS MAY RESULT IN THE CANCELLATION OF YOUR SURGERY   ________________________________________________________________________

## 2022-05-20 ENCOUNTER — Encounter (HOSPITAL_COMMUNITY)
Admission: RE | Admit: 2022-05-20 | Discharge: 2022-05-20 | Disposition: A | Discharge status: Home / Self Care | Payer: 59 | Source: Ambulatory Visit | Attending: Urology | Admitting: Urology

## 2022-05-20 ENCOUNTER — Encounter (HOSPITAL_COMMUNITY): Payer: Self-pay

## 2022-05-20 ENCOUNTER — Other Ambulatory Visit: Payer: Self-pay

## 2022-05-20 VITALS — BP 128/66 | HR 62 | Temp 98.1°F | Resp 18 | Ht 61.0 in | Wt 147.0 lb

## 2022-05-20 DIAGNOSIS — Z79899 Other long term (current) drug therapy: Secondary | ICD-10-CM | POA: Diagnosis not present

## 2022-05-20 DIAGNOSIS — Z01818 Encounter for other preprocedural examination: Secondary | ICD-10-CM

## 2022-05-20 DIAGNOSIS — Z01812 Encounter for preprocedural laboratory examination: Secondary | ICD-10-CM | POA: Diagnosis present

## 2022-05-20 LAB — BASIC METABOLIC PANEL
Anion gap: 7 (ref 5–15)
BUN: 15 mg/dL (ref 8–23)
CO2: 25 mmol/L (ref 22–32)
Calcium: 9.2 mg/dL (ref 8.9–10.3)
Chloride: 107 mmol/L (ref 98–111)
Creatinine, Ser: 0.58 mg/dL (ref 0.44–1.00)
GFR, Estimated: >60 mL/min (ref >60)
Glucose, Bld: 98 mg/dL (ref 70–99)
Potassium: 4.2 mmol/L (ref 3.5–5.1)
Sodium: 139 mmol/L (ref 135–145)

## 2022-05-20 LAB — CBC
HCT: 42.7 % (ref 36.0–46.0)
Hemoglobin: 13.5 g/dL (ref 12.0–15.0)
MCH: 28.2 pg (ref 26.0–34.0)
MCHC: 31.6 g/dL (ref 30.0–36.0)
MCV: 89.1 fL (ref 80.0–100.0)
Platelets: 246 10*3/uL (ref 150–400)
RBC: 4.79 MIL/uL (ref 3.87–5.11)
RDW: 14.1 % (ref 11.5–15.5)
WBC: 5.7 10*3/uL (ref 4.0–10.5)
nRBC: 0 % (ref 0.0–0.2)

## 2022-05-20 LAB — SURGICAL PCR SCREEN
MRSA, PCR: NEGATIVE
Staphylococcus aureus: NEGATIVE

## 2022-05-20 NOTE — Progress Notes (Signed)
Anesthesia note:  Bowel prep reminder:  NA  PCP - Dr. Oneida Arenas Cardiologist -none Other-   Chest x-ray - no EKG - 03/31/22-epic Stress Test - no ECHO - no Cardiac Cath - no CABG-no Pacemaker/ICD device last checked:NA  Sleep Study - no CPAP -   Pt is pre diabetic-no CBG at PAT visit- Fasting Blood Sugar at home- Checks Blood Sugar _____  Blood Thinner:no Blood Thinner Instructions: Aspirin Instructions: Last Dose:  Anesthesia review: /No    Patient denies shortness of breath, fever, cough and chest pain at PAT appointment. Pt reports no SOB with activities   Patient verbalized understanding of instructions that were given to them at the PAT appointment. Patient was also instructed that they will need to review over the PAT instructions again at home before surgery.yes She will call pharmacy to reconcile her medication list.

## 2022-05-30 NOTE — Progress Notes (Signed)
Pt aware to arrive at [facility] admitting at 0700 on Friday 05/31/2022 for scheduled surgical procedure. Pt aware no food after midnight; clear liquids from midnight till 0615 then nothing by mouth.

## 2022-05-31 ENCOUNTER — Ambulatory Visit (HOSPITAL_BASED_OUTPATIENT_CLINIC_OR_DEPARTMENT_OTHER): Payer: 59 | Admitting: Anesthesiology

## 2022-05-31 ENCOUNTER — Other Ambulatory Visit: Payer: Self-pay

## 2022-05-31 ENCOUNTER — Encounter (HOSPITAL_COMMUNITY): Payer: Self-pay | Admitting: Urology

## 2022-05-31 ENCOUNTER — Ambulatory Visit (HOSPITAL_COMMUNITY): Payer: 59 | Admitting: Anesthesiology

## 2022-05-31 ENCOUNTER — Ambulatory Visit (HOSPITAL_COMMUNITY)
Admission: RE | Admit: 2022-05-31 | Discharge: 2022-05-31 | Disposition: A | Discharge status: Home / Self Care | Payer: 59 | Source: Ambulatory Visit | Attending: Urology | Admitting: Urology

## 2022-05-31 ENCOUNTER — Encounter (HOSPITAL_COMMUNITY)
Admission: RE | Disposition: A | Discharge status: Home / Self Care | Payer: Self-pay | Source: Ambulatory Visit | Attending: Urology

## 2022-05-31 ENCOUNTER — Ambulatory Visit (HOSPITAL_COMMUNITY): Payer: 59

## 2022-05-31 DIAGNOSIS — N201 Calculus of ureter: Secondary | ICD-10-CM | POA: Diagnosis present

## 2022-05-31 DIAGNOSIS — Z79899 Other long term (current) drug therapy: Secondary | ICD-10-CM | POA: Diagnosis not present

## 2022-05-31 DIAGNOSIS — E785 Hyperlipidemia, unspecified: Secondary | ICD-10-CM | POA: Insufficient documentation

## 2022-05-31 DIAGNOSIS — K219 Gastro-esophageal reflux disease without esophagitis: Secondary | ICD-10-CM | POA: Insufficient documentation

## 2022-05-31 DIAGNOSIS — M1990 Unspecified osteoarthritis, unspecified site: Secondary | ICD-10-CM | POA: Diagnosis not present

## 2022-05-31 DIAGNOSIS — Z87440 Personal history of urinary (tract) infections: Secondary | ICD-10-CM | POA: Insufficient documentation

## 2022-05-31 DIAGNOSIS — I10 Essential (primary) hypertension: Secondary | ICD-10-CM | POA: Diagnosis not present

## 2022-05-31 HISTORY — PX: CYSTOSCOPY/URETEROSCOPY/HOLMIUM LASER/STENT PLACEMENT: SHX6546

## 2022-05-31 SURGERY — CYSTOSCOPY/URETEROSCOPY/HOLMIUM LASER/STENT PLACEMENT
Anesthesia: General | Laterality: Right

## 2022-05-31 MED ORDER — PROPOFOL 10 MG/ML IV BOLUS
INTRAVENOUS | Status: AC
  Filled 2022-05-31: qty 20

## 2022-05-31 MED ORDER — SODIUM CHLORIDE 0.9 % IR SOLN
Status: DC | PRN
  Administered 2022-05-31: 3000 mL via INTRAVESICAL

## 2022-05-31 MED ORDER — OXYCODONE HCL 5 MG/5ML PO SOLN: 5.0000 mg | Freq: Once | ORAL | Status: DC | PRN

## 2022-05-31 MED ORDER — MIDAZOLAM HCL 5 MG/5ML IJ SOLN
INTRAMUSCULAR | Status: DC | PRN
  Administered 2022-05-31: 2 mg via INTRAVENOUS

## 2022-05-31 MED ORDER — IOHEXOL 300 MG/ML  SOLN
INTRAMUSCULAR | Status: DC | PRN
  Administered 2022-05-31: 4 mL

## 2022-05-31 MED ORDER — EPHEDRINE SULFATE-NACL 50-0.9 MG/10ML-% IV SOSY
PREFILLED_SYRINGE | INTRAVENOUS | Status: DC | PRN
  Administered 2022-05-31: 5 mg via INTRAVENOUS
  Administered 2022-05-31: 10 mg via INTRAVENOUS
  Administered 2022-05-31: 25 mg via INTRAVENOUS

## 2022-05-31 MED ORDER — FENTANYL CITRATE (PF) 100 MCG/2ML IJ SOLN
INTRAMUSCULAR | Status: DC | PRN
  Administered 2022-05-31 (×2): 50 ug via INTRAVENOUS

## 2022-05-31 MED ORDER — FENTANYL CITRATE (PF) 100 MCG/2ML IJ SOLN
INTRAMUSCULAR | Status: AC
  Filled 2022-05-31: qty 2

## 2022-05-31 MED ORDER — PROPOFOL 10 MG/ML IV BOLUS
INTRAVENOUS | Status: DC | PRN
  Administered 2022-05-31: 160 mg via INTRAVENOUS

## 2022-05-31 MED ORDER — LIDOCAINE 2% (20 MG/ML) 5 ML SYRINGE
INTRAMUSCULAR | Status: DC | PRN
  Administered 2022-05-31: 100 mg via INTRAVENOUS

## 2022-05-31 MED ORDER — LACTATED RINGERS IV SOLN: INTRAVENOUS | Status: DC

## 2022-05-31 MED ORDER — EPHEDRINE 5 MG/ML INJ
INTRAVENOUS | Status: AC
  Filled 2022-05-31: qty 5

## 2022-05-31 MED ORDER — LIDOCAINE HCL (PF) 2 % IJ SOLN
INTRAMUSCULAR | Status: AC
  Filled 2022-05-31: qty 5

## 2022-05-31 MED ORDER — ONDANSETRON HCL 4 MG/2ML IJ SOLN: 4.0000 mg | Freq: Once | INTRAMUSCULAR | Status: DC | PRN

## 2022-05-31 MED ORDER — ORAL CARE MOUTH RINSE: 15.0000 mL | Freq: Once | OROMUCOSAL | Status: AC

## 2022-05-31 MED ORDER — DEXAMETHASONE SODIUM PHOSPHATE 10 MG/ML IJ SOLN
INTRAMUSCULAR | Status: AC
  Filled 2022-05-31: qty 1

## 2022-05-31 MED ORDER — CEPHALEXIN 500 MG PO TABS: 500.0000 mg | ORAL_TABLET | Freq: Two times a day (BID) | ORAL | 0 refills | Status: DC

## 2022-05-31 MED ORDER — OXYCODONE HCL 5 MG PO TABS: 5.0000 mg | ORAL_TABLET | Freq: Once | ORAL | Status: DC | PRN

## 2022-05-31 MED ORDER — 0.9 % SODIUM CHLORIDE (POUR BTL) OPTIME
TOPICAL | Status: DC | PRN
  Administered 2022-05-31: 1000 mL

## 2022-05-31 MED ORDER — CHLORHEXIDINE GLUCONATE 0.12 % MT SOLN
15.0000 mL | Freq: Once | OROMUCOSAL | Status: AC
  Administered 2022-05-31: 15 mL via OROMUCOSAL

## 2022-05-31 MED ORDER — ONDANSETRON HCL 4 MG/2ML IJ SOLN
INTRAMUSCULAR | Status: AC
  Filled 2022-05-31: qty 2

## 2022-05-31 MED ORDER — FENTANYL CITRATE PF 50 MCG/ML IJ SOSY: 25.0000 ug | PREFILLED_SYRINGE | INTRAMUSCULAR | Status: DC | PRN

## 2022-05-31 MED ORDER — DEXAMETHASONE SODIUM PHOSPHATE 10 MG/ML IJ SOLN
INTRAMUSCULAR | Status: DC | PRN
  Administered 2022-05-31: 10 mg via INTRAVENOUS

## 2022-05-31 MED ORDER — ONDANSETRON HCL 4 MG/2ML IJ SOLN
INTRAMUSCULAR | Status: DC | PRN
  Administered 2022-05-31: 4 mg via INTRAVENOUS

## 2022-05-31 MED ORDER — KETOROLAC TROMETHAMINE 30 MG/ML IJ SOLN: 30.0000 mg | Freq: Once | INTRAMUSCULAR | Status: DC | PRN

## 2022-05-31 MED ORDER — MIDAZOLAM HCL 2 MG/2ML IJ SOLN
INTRAMUSCULAR | Status: AC
  Filled 2022-05-31: qty 2

## 2022-05-31 MED ORDER — CEFAZOLIN SODIUM-DEXTROSE 2-4 GM/100ML-% IV SOLN
2.0000 g | INTRAVENOUS | Status: AC
  Administered 2022-05-31: 2 g via INTRAVENOUS
  Filled 2022-05-31: qty 100

## 2022-05-31 SURGICAL SUPPLY — 25 items
BAG URO CATCHER STRL LF (MISCELLANEOUS) ×1 IMPLANT
BASKET ZERO TIP NITINOL 2.4FR (BASKET) IMPLANT
BSKT STON RTRVL ZERO TP 2.4FR (BASKET) ×1
CATH ROBINSON RED A/P 14FR (CATHETERS) IMPLANT
CATH SILICONE 14FRX5CC (CATHETERS) IMPLANT
CATH URET 5FR 28IN CONE TIP (BALLOONS)
CATH URET 5FR 70CM CONE TIP (BALLOONS) IMPLANT
CATH URETL OPEN END 6FR 70 (CATHETERS) ×1 IMPLANT
CLOTH BEACON ORANGE TIMEOUT ST (SAFETY) ×1 IMPLANT
GLOVE BIO SURGEON STRL SZ7.5 (GLOVE) ×1 IMPLANT
GOWN STRL REUS W/ TWL XL LVL3 (GOWN DISPOSABLE) ×1 IMPLANT
GOWN STRL REUS W/TWL XL LVL3 (GOWN DISPOSABLE) ×2
GUIDEWIRE STR DUAL SENSOR (WIRE) ×1 IMPLANT
GUIDEWIRE ZIPWRE .038 STRAIGHT (WIRE) IMPLANT
KIT TURNOVER KIT A (KITS) IMPLANT
LASER FIB FLEXIVA PULSE ID 365 (Laser) IMPLANT
MANIFOLD NEPTUNE II (INSTRUMENTS) ×1 IMPLANT
PACK CYSTO (CUSTOM PROCEDURE TRAY) ×1 IMPLANT
SHEATH NAVIGATOR HD 11/13X28 (SHEATH) IMPLANT
SHEATH NAVIGATOR HD 11/13X36 (SHEATH) IMPLANT
STENT URET 6FRX24 CONTOUR (STENTS) IMPLANT
TRACTIP FLEXIVA PULS ID 200XHI (Laser) IMPLANT
TRACTIP FLEXIVA PULSE ID 200 (Laser) ×1
TUBING CONNECTING 10 (TUBING) ×1 IMPLANT
TUBING UROLOGY SET (TUBING) ×1 IMPLANT

## 2022-05-31 NOTE — H&P (Signed)
Office Visit Report     05/03/2022   --------------------------------------------------------------------------------   Laura Mills. Laura Mills  MRN: [id number]  DOB: Sep 21, 1960, 62 year old Female  SSN: [id number]   PRIMARY CARE:  Collene Leyden, MD  PRIMARY CARE FAX:  [phone]  REFERRING:  Georgette Dover, MD  PROVIDER:  Festus Aloe, M.D.  TREATING:  Daine Gravel, NP  LOCATION:  [facility] [phone]     --------------------------------------------------------------------------------   CC/HPI: F/u -    1) ureteral stone - she developed right flank pain and underwent CT 03/31/2022 which revealed a 5-6 mm right proximal stone. It may be visible on the scout. No renal stones. WBC was 11.5, cr 0.87, ua rare bac, 21-50 rbc.   2) SUI - incontinence treated with MUS in 2020. No leakage and voiding well.   Today, seen for the above. She has been straining. No stone passage. She had pain a week later with emesis and then it all resolved. No flank pain. No urgency. On bactrim since 03/31/2022. Continues tamsulosin. Occasional mild nauseas.   KUB today - there is a 6 mm opacity in the pelvis but it is slightly lateral.   05/03/22: Ms. Laura Mills presents today for follow up regarding a 37m right sided stone. she is currently not having any nausea, vomiting or fevers. Her pain is well controlled. Renal [place] without obstruction.     ALLERGIES: Cipro TABS - Other Reaction, myalgia Macrodantin CAPS - Hives    MEDICATIONS: Tamsulosin Hcl 0.4 mg capsule 1 capsule PO Daily  Atorvastatin Calcium 10 mg tablet  Calcium + D  Estradiol 2 MG Oral Tablet Oral  Metoprolol Succinate 200 mg tablet, extended release 24 hr Oral  Ondansetron Odt 4 mg tablet,disintegrating 1 tablet PO Q 8 H PRN  Robaxin 500 mg tablet PRN  Tramadol Hcl 50 mg tablet PRN  Turmeric  Vitamin B-12  Vitamin D2     GU PSH: Complex cystometrogram, w/ void pressure and urethral pressure profile studies, any  technique - 2019 Complex Uroflow - 2019 Cystoscopy - 2019, 2018 Cystoscopy Insert Stent - 2014, 2014 Emg surf Electrd - 2019 ESWL - 2014 Hysterectomy Unilat SO - 2009 Inject For cystogram - 2019 Intrabd voidng Press - 2019 Sling - 2019, 2019, 2019 Ureteroscopic stone removal - 2014     NON-GU PSH: Cataract surgery, Bilateral Rotator Cuff Surgery..     GU PMH: Ureteral calculus, refill tams and ondansetron. See back for Rt renal [place] kub. If moving or isolated consider eswl. - 04/25/2022 Incontinence w/o Sensation - 2019 Mixed incontinence (Stable) - 2019, - 2019, - 2019, - 2018, - 2018 Nocturia - 2019 Urinary Frequency - 2019 Urinary incontinence, Unspec (Worsening, Chronic), Given Bladder Matters Booklet. Discussed fluid/diet modifications. Will stop Oxybutynin and try Myrbetriq 50 mg 1 po daily and f/u in as scheduled 07/02/17 w/Dr. EJunious Silk If no change in UI with Myrbetriq she may need to consider PTNS vs PT/OT vs Botox - 2019 Renal calculus - 2018, Kidney stone on left side, - 2014 History of urolithiasis (Stable, Chronic), No bilateral renal/ureteral caulculus noted. ? pill noted w/in RLQ has now progressed into pelvis. This still appears to be w/in bowel vs ureteral. - 2017, History of renal calculi, - 2016 Urge incontinence, Urge incontinence of urine - 2016 Stress Incontinence, Female stress incontinence - 2015 Low back pain, Lower back pain - 2014 Oth GU systems Signs/Symptoms, Urethralgia - 2014 LLQ pain, Abdominal pain, LLQ (left lower quadrant) - 2014,  Abdominal Pain In The Left Lower Belly (LLQ), - 2014 Other microscopic hematuria, Microscopic Hematuria - 2014 Pelvic/perineal pain, Abdominal Pain Above The Pubic Area (Suprapubic) - 2014 Personal Hx Oth Urinary System diseases, History of urethritis - 2014 RLQ pain, Abdominal Pain In The Right Lower Belly (RLQ) - 2014 Urinary Urgency, Feelings Of Urinary Urgency - 2014      PMH Notes: .   NON-GU PMH: Encounter  for general adult medical examination without abnormal findings, Encounter for preventive health examination - 2016 Muscle weakness (generalized), Muscle weakness - 2015 Other lack of coordination, Muscular incoordination - 2015 Hypercalciuria, Hypercalciuria - 2014 Personal history of other specified conditions, History of heartburn - 2014 Arthritis GERD Hypercholesterolemia Hypertension    FAMILY HISTORY: Anemia - Father Death In The Family Father - Other Family Health Status - Father alive at age 62 - 89 In Family Family Health Status - Mother's Age - Runs In Family Family Health Status Number - Runs In Family Heart Disease - Father Hematuria - Father Nephrectomy - Father nephrolithiasis - Father Renal Cell Carcinoma - Father   SOCIAL HISTORY: Marital Status: Married Preferred Language: English; Ethnicity: Not Hispanic Or Latino; Race: White Current Smoking Status: Patient has never smoked.  Does not use smokeless tobacco. Has never drank.  Does not use drugs. Drinks 1 caffeinated drink per day. Patient's occupation is/was [employer].    REVIEW OF SYSTEMS:    GU Review Female:   Patient denies frequent urination, hard to postpone urination, burning /pain with urination, get up at night to urinate, leakage of urine, stream starts and stops, trouble starting your stream, have to strain to urinate, and being pregnant.  Gastrointestinal (Upper):   Patient denies nausea, vomiting, and indigestion/ heartburn.  Gastrointestinal (Lower):   Patient denies diarrhea and constipation.  Constitutional:   Patient denies fever, night sweats, weight loss, and fatigue.  Eyes:   Patient denies blurred vision and double vision.  Musculoskeletal:   Patient reports back pain. Patient denies joint pain.  Neurological:   Patient denies headaches and dizziness.   VITAL SIGNS:      05/03/2022 09:17 AM  Heart Rate 66 /min  Temperature 98.0 F / 36.6 C   MULTI-SYSTEM PHYSICAL  EXAMINATION:    Constitutional: Well-nourished. No physical deformities. Normally developed. Good grooming.  Respiratory: No labored breathing, no use of accessory muscles.   Cardiovascular: Normal temperature, normal extremity pulses, no swelling, no varicosities.  Skin: No paleness, no jaundice, no cyanosis. No lesion, no ulcer, no rash.  Neurologic / Psychiatric: Oriented to time, oriented to place, oriented to person. No depression, no anxiety, no agitation.  Gastrointestinal: No mass, no tenderness, no rigidity, non obese abdomen.     Complexity of Data:  Source Of History:  Patient  Urine Test Review:   Urinalysis  X-Ray Review: KUB: Reviewed Films. Reviewed Report. Discussed With Patient.  Renal Ultrasound (Limited): Reviewed Films. Reviewed Report. Discussed With Patient.  C.T. Abdomen/Pelvis: Reviewed Films. Reviewed Report. Discussed With Patient.     05/03/22 05/03/22  Urinalysis  Urine Appearance Slightly Cloudy  Slightly Cloudy   Urine Color Yellow  Yellow   Urine Glucose Neg mg/dL Neg   Urine Bilirubin Neg mg/dL Neg   Urine Ketones Neg mg/dL Neg   Urine Specific Gravity 1.025  1.025   Urine Blood Neg ery/uL Neg   Urine pH <=5.0  <=5.0   Urine Protein Neg mg/dL Neg   Urine Urobilinogen 0.2 mg/dL 0.2   Urine Nitrites Neg  Neg   Urine Leukocyte Esterase Neg leu/uL Neg   Urine WBC/hpf 0 - 5/hpf  0 - 5/hpf   Urine RBC/hpf NS (Not Seen)  NS (Not Seen)   Urine Epithelial Cells 6 - 10/hpf  6 - 10/hpf   Urine Bacteria Mod (26-50/hpf)  Mod (26-50/hpf)   Urine Mucous Not Present  Not Present   Urine Yeast NS (Not Seen)  NS (Not Seen)   Urine Trichomonas Not Present  Not Present   Urine Cystals NS (Not Seen)  NS (Not Seen)   Urine Casts NS (Not Seen)  NS (Not Seen)   Urine Sperm Not Present  Not Present    PROCEDURES:         C.T. Urogram - [id number]      Patient confirmed No Neulasta OnPro Device.          Renal Ultrasound (Limited) [id number]  Kidney: RIGHT Length:  10.13cm Depth: 6 cm Cortical Width: 1.07 cm Width: 5.58cm    Right Kidney/Ureter:  wnl  Bladder:  pvr 8.73m      . Patient confirmed No Neulasta OnPro Device.            KUB - [id number] A single view of the abdomen is obtained. Bilateral renal shadows are visualized. bilateral renal shadows are visualized. On the edge of the upper portion of the left renal shadow there is a 459mcircular opacitiy. There are multiple pelvic phelboliths within the pelvic inlet.       . Patient confirmed No Neulasta OnPro Device.           Urinalysis w/Scope - 81001 Dipstick Dipstick Cont'd Micro  Color: Yellow Bilirubin: Neg WBC/hpf: 0 - 5/hpf  Appearance: Slightly Cloudy Ketones: Neg RBC/hpf: NS (Not Seen)  Specific Gravity: 1.025 Blood: Neg Bacteria: Mod (26-50/hpf)  pH: <=5.0 Protein: Neg Cystals: NS (Not Seen)  Glucose: Neg Urobilinogen: 0.2 Casts: NS (Not Seen)    Nitrites: Neg Trichomonas: Not Present    Leukocyte Esterase: Neg Mucous: Not Present      Epithelial Cells: 6 - 10/hpf      Yeast: NS (Not Seen)      Sperm: Not Present    Notes:  QNS for spun micro     ASSESSMENT:      ICD-10 Details  1 GU:   Ureteral calculus - N20.1 Right, Acute, Uncomplicated   PLAN:           Orders X-Rays: C.T. Stone Protocol Without I.V. Contrast - She has not passed stone,m but is not having symtpoms. Prior to surgery, would like to ensure that the stone is still present.   X-Ray Notes: . History:  Hematuria: Yes/No  Patient to see MD after exam: Yes/No  Previous exam: CT / IVP/ US/ KUB/ None  When:  Where:  Diabetic: Yes/ No  BUN/ Creatinine:  Date of last BUN Creatinine:  Weight in pounds:  Allergy- IV Contrast: Yes/ No  Conflicting diabetic meds: Yes/ No  Diabetic Meds:  Prior Authorization #: [employer] NPCR Case# [id number]         Schedule Return Visit/Planned Activity: Next Available Appointment - Schedule Surgery          Document Letter(s):  Created for Patient:  Clinical Summary         Notes:   Renal [place] normal and KUB is inconclusive. CT scan was ordered due patient having some continued symptoms and not seeing the stone pass. This shows a distal right ureteral  stone non-obstructing at this time. She would like to pursue definitive stone intervention. Urinalysis will be sent for precautionary culture today. Stone intervention was discussed in detail today. For ureteroscopy, the patient understands that there is a chance for a staged procedure. Patient also understands that there is risk for bleeding, infection, injury to surrounding organs, and general risks of anesthesia. The patient also understands the placement of a stent and the risks of stent placement including, risk for infection, the risk for pain, and the risk for injury. For ESWL, the patient understands that there is a chance of failure of procedure, there is also a risk for bruising, infection, bleeding, and injury to surrounding structures. The patient verbalized understanding to these risks.   A green sheet was placed for a right-sided ureteroscopy today.        Next Appointment:      Next Appointment: 05/31/2022 10:30 AM    Appointment Type: Surgery     Location: [facility] [phone]    Provider: Festus Aloe, M.D.    Reason for Visit: OP WL CYSTO RT RPG URS HLL STENT      * Signed by Daine Gravel, NP on 05/15/22 at 12:25 PM (EST)*      The information contained in this medical record document is considered private and confidential patient information. This information can only be used for the medical diagnosis and/or medical services that are being provided by the patient's selected caregivers. This information can only be distributed outside of the patient's care if the patient agrees and signs waivers of authorization for this information to be sent to an outside source or route.  ADD: she had dysuria and treated with cephalexin for a klebsiella UTI  05/20/2022.

## 2022-05-31 NOTE — Progress Notes (Signed)
Pharmacy tech called for med rec to be completed.  Pharmacy stated that there is not a pharmacy tech available until 1000 am.  Medications reviewed by RN with patient

## 2022-05-31 NOTE — Anesthesia Procedure Notes (Signed)
Procedure Name: LMA Insertion Date/Time: 05/31/2022 9:13 AM  Performed by: Lind Covert, CRNAPre-anesthesia Checklist: Patient identified, Emergency Drugs available, Suction available, Patient being monitored and Timeout performed Patient Re-evaluated:Patient Re-evaluated prior to induction Oxygen Delivery Method: Circle system utilized Preoxygenation: Pre-oxygenation with 100% oxygen Induction Type: IV induction LMA: LMA inserted LMA Size: 4.0 Tube type: Oral Number of attempts: 1 Placement Confirmation: positive ETCO2 and breath sounds checked- equal and bilateral Tube secured with: Tape Dental Injury: Teeth and Oropharynx as per pre-operative assessment

## 2022-05-31 NOTE — Anesthesia Preprocedure Evaluation (Signed)
Anesthesia Evaluation  Patient identified by MRN, date of birth, ID band Patient awake    Reviewed: Allergy & Precautions, H&P , NPO status , Patient's Chart, lab work & pertinent test results  Airway Mallampati: II  TM Distance: >3 FB Neck ROM: Full    Dental no notable dental hx.    Pulmonary neg pulmonary ROS   Pulmonary exam normal breath sounds clear to auscultation       Cardiovascular hypertension, Pt. on medications and Pt. on home beta blockers Normal cardiovascular exam Rhythm:Regular Rate:Normal     Neuro/Psych negative neurological ROS  negative psych ROS   GI/Hepatic Neg liver ROS,GERD  ,,  Endo/Other  negative endocrine ROS    Renal/GU negative Renal ROS  negative genitourinary   Musculoskeletal negative musculoskeletal ROS (+)    Abdominal   Peds negative pediatric ROS (+)  Hematology negative hematology ROS (+)   Anesthesia Other Findings   Reproductive/Obstetrics negative OB ROS                             Anesthesia Physical Anesthesia Plan  ASA: 2  Anesthesia Plan: General   Post-op Pain Management: Minimal or no pain anticipated   Induction: Intravenous  PONV Risk Score and Plan: 3 and Ondansetron, Dexamethasone and Treatment may vary due to age or medical condition  Airway Management Planned: LMA  Additional Equipment:   Intra-op Plan:   Post-operative Plan: Extubation in OR  Informed Consent: I have reviewed the patients History and Physical, chart, labs and discussed the procedure including the risks, benefits and alternatives for the proposed anesthesia with the patient or authorized representative who has indicated his/her understanding and acceptance.     Dental advisory given  Plan Discussed with: CRNA and Surgeon  Anesthesia Plan Comments:        Anesthesia Quick Evaluation

## 2022-05-31 NOTE — Discharge Instructions (Addendum)
Removal of the stent: Remove the stent on Tuesday,  06/04/2022, morning by pulling the string as instructed.

## 2022-05-31 NOTE — Op Note (Signed)
Preoperative diagnosis: Right ureteral stone Postoperative diagnosis: Same  Procedure: Meatal dilation, cystoscopy, right retrograde pyelogram, right ureteroscopy laser lithotripsy, stone basket extraction and right ureteral stent placement  Surgeon: Junious Silk  Anesthesia: General  Indication for procedure: Laura Mills is a 62 year old female with a symptomatic right ureteral stone that failed to pass.  Findings: Meatal stenosis.  Required dilation to 22 [place].  Urethra and bladder unremarkable.  No mucosal lesions.  Trigone and ureteral orifice ease appeared normal.  No stone or foreign body in the bladder.  Right retrograde pyelogram-this outlined a filling defect in the right distal ureter at the ureterovesical junction consistent with the stone and mild proximal hydroureteronephrosis.  Stone was confirmed on right ureteroscopy in the right distal ureter.  Description of procedure: After consent was obtained patient brought to the operating room.  After adequate anesthesia she was placed lithotomy position and prepped and draped in the usual sterile fashion.  Timeout was performed to confirm the patient and procedure.  The urethral meatus was not readily apparent.  I probed with the cystoscope and the obturator and then a red rubber catheter and then a smoother straight clear 14 [place] Foley.  I was able to find the meatus and passed a 16 French urethral dilator but it met some resistance.  Therefore I took the sensor wire passed into the bladder and confirmed it coiled under fluoroscopic guidance.  I then passed the female dilators over the wire from 16-22 [place].  Now the cystoscope passed without difficulty.  It appeared the meatus was tight.  I then cannulated the right ureteral orifice after inspecting the bladder with a 5 [place] open-ended catheter and retrograde injection of contrast was performed.  Then passed a sensor wire up into the upper calyx.  A semirigid dual channel ureteroscope was  passed and the stone was visualized.  It was dusted with a 54 m laser fiber and then the pieces were swept out with a 0 tip basket and dropped in the bladder.  Inspection up into the mid ureter noted there to be no other stones or ureteral injury apart from some blanching of the mucosa laterally in the distal ureter from the laser.  The semirigid was backed out and the wire was backloaded on the cystoscope and a 6 x 24 cm stent advanced.  The wire was removed with a good coil seen in the kidney and a good coil in the bladder.  Bladder was drained and the scope removed.  She was awakened taken recovery room in stable condition.  Complications: None  Blood loss: Minimal  Specimens: None  Drains: 6 x 24 cm right ureteral stent with string  Disposition: Patient stable to PACU-I spoke with Jenny Reichmann and went over the procedure, postop care and follow-up.

## 2022-05-31 NOTE — Anesthesia Postprocedure Evaluation (Signed)
Anesthesia Post Note  Patient: Laura Mills  Procedure(s) Performed: CYSTOSCOPY RIGHT URETEROSCOPY/HOLMIUM LASER/STENT PLACEMENT (Right)     Patient location during evaluation: PACU Anesthesia Type: General Level of consciousness: awake and alert Pain management: pain level controlled Vital Signs Assessment: post-procedure vital signs reviewed and stable Respiratory status: spontaneous breathing, nonlabored ventilation, respiratory function stable and patient connected to nasal cannula oxygen Cardiovascular status: blood pressure returned to baseline and stable Postop Assessment: no apparent nausea or vomiting Anesthetic complications: no  No notable events documented.  Last Vitals:  Vitals:   05/31/22 1004 05/31/22 1015  BP: 124/72 113/76  Pulse: 91 72  Resp: 15 20  Temp: 36.5 C   SpO2: 100% 97%    Last Pain:  Vitals:   05/31/22 1015  TempSrc:   PainSc: 0-No pain                 Bryam Taborda S

## 2022-05-31 NOTE — Transfer of Care (Signed)
Immediate Anesthesia Transfer of Care Note  Patient: Laura Mills  Procedure(s) Performed: CYSTOSCOPY RIGHT URETEROSCOPY/HOLMIUM LASER/STENT PLACEMENT (Right)  Patient Location: PACU  Anesthesia Type:General  Level of Consciousness: sedated  Airway & Oxygen Therapy: Patient Spontanous Breathing and Patient connected to face mask oxygen  Post-op Assessment: Report given to RN and Post -op Vital signs reviewed and stable  Post vital signs: Reviewed and stable  Last Vitals:  Vitals Value Taken Time  BP    Temp    Pulse 91 05/31/22 1003  Resp    SpO2 100 % 05/31/22 1003  Vitals shown include unvalidated device data.  Last Pain:  Vitals:   05/31/22 0736  TempSrc:   PainSc: 0-No pain      Patients Stated Pain Goal: 3 ([id number])  Complications: No notable events documented.

## 2022-05-31 NOTE — Interval H&P Note (Signed)
History and Physical Interval Note:  05/31/2022 9:10 AM  Laura Mills  has presented today for surgery, with the diagnosis of RIGHT DISTAL URETAL STONE.  The various methods of treatment have been discussed with the patient and family. After consideration of risks, benefits and other options for treatment, the patient has consented to  Procedure(s) with comments: CYSTOSCOPY RIGHT URETEROSCOPY/HOLMIUM LASER/STENT PLACEMENT (Right) - 60 MINS as a surgical intervention.  The patient's history has been reviewed, patient examined, no change in status, stable for surgery.  I have reviewed the patient's chart and labs.  She had cloudy urine last week. That cleared with abx. No dysuria, bladder pain, fever or cloudy urine today. She has been straining. No stone passage. Discussed stone may have passed. Questions were answered to the patient's and husbands satisfaction.     Festus Aloe

## 2022-06-01 ENCOUNTER — Encounter (HOSPITAL_COMMUNITY): Payer: Self-pay | Admitting: Urology

## 2023-01-30 ENCOUNTER — Telehealth: Payer: Self-pay | Admitting: Gastroenterology

## 2023-01-30 NOTE — Telephone Encounter (Signed)
Inbound call from patient requesting to know if she is able to schedule recall colonoscopy for December in November. States her husband will be retiring and she will no longer be under his insurance. Patient stated she spoke with insurance and they stated they would cover colonoscopy for November. Please advise on scheduling, thank you.

## 2023-01-31 ENCOUNTER — Encounter: Payer: Self-pay | Admitting: Internal Medicine

## 2023-01-31 NOTE — Telephone Encounter (Signed)
Yes she can be scheduled thank you

## 2023-02-13 ENCOUNTER — Ambulatory Visit: Payer: 59

## 2023-02-13 ENCOUNTER — Encounter: Payer: Self-pay | Admitting: Internal Medicine

## 2023-02-13 ENCOUNTER — Other Ambulatory Visit: Payer: Self-pay

## 2023-02-13 VITALS — Ht 61.0 in | Wt 150.0 lb

## 2023-02-13 DIAGNOSIS — Z1211 Encounter for screening for malignant neoplasm of colon: Secondary | ICD-10-CM

## 2023-02-13 MED ORDER — NA SULFATE-K SULFATE-MG SULF 17.5-3.13-1.6 GM/177ML PO SOLN: 1.0000 | Freq: Once | ORAL | 0 refills | Status: AC

## 2023-02-13 NOTE — Progress Notes (Signed)
Denies allergies to eggs or soy products. Denies complication of anesthesia or sedation. Denies use of weight loss medication. Denies use of O2.   Emmi instructions given for colonoscopy.  

## 2023-02-26 ENCOUNTER — Ambulatory Visit: Payer: 59 | Admitting: Internal Medicine

## 2023-02-26 ENCOUNTER — Encounter: Payer: Self-pay | Admitting: Internal Medicine

## 2023-02-26 VITALS — BP 128/60 | HR 72 | Temp 97.8°F | Resp 16 | Ht 61.0 in | Wt 150.0 lb

## 2023-02-26 DIAGNOSIS — Z1211 Encounter for screening for malignant neoplasm of colon: Secondary | ICD-10-CM

## 2023-02-26 DIAGNOSIS — D123 Benign neoplasm of transverse colon: Secondary | ICD-10-CM

## 2023-02-26 MED ORDER — SODIUM CHLORIDE 0.9 % IV SOLN: 500.0000 mL | Freq: Once | INTRAVENOUS | Status: DC

## 2023-02-26 NOTE — Progress Notes (Signed)
Called to room to assist during endoscopic procedure.  Patient ID and intended procedure confirmed with present staff. Received instructions for my participation in the procedure from the performing physician.  

## 2023-02-26 NOTE — Progress Notes (Unsigned)
Vss nad trans to pacu 

## 2023-02-26 NOTE — Op Note (Signed)
[facility] Patient Name: Laura Mills Procedure Date: 02/26/2023 4:02 PM MRN: [id number] Endoscopist: Madelyn Brunner [place] , , [id number] Age: 62 Referring MD:  Date of Birth: 08-09-1960 Gender: Female Account #: [id number] Procedure:                Colonoscopy Indications:              Screening for colorectal malignant neoplasm Medicines:                Monitored Anesthesia Care Procedure:                Pre-Anesthesia Assessment:                           - Prior to the procedure, a History and Physical                            was performed, and patient medications and                            allergies were reviewed. The patient's tolerance of                            previous anesthesia was also reviewed. The risks                            and benefits of the procedure and the sedation                            options and risks were discussed with the patient.                            All questions were answered, and informed consent                            was obtained. Prior Anticoagulants: The patient has                            taken no anticoagulant or antiplatelet agents. ASA                            Grade Assessment: II - A patient with mild systemic                            disease. After reviewing the risks and benefits,                            the patient was deemed in satisfactory condition to                            undergo the procedure.                           After obtaining informed consent, the colonoscope  was passed under direct vision. Throughout the                            procedure, the patient's blood pressure, pulse, and                            oxygen saturations were monitored continuously. The                            CF [id number] #[id number] was introduced through the anus                            and advanced to the the terminal ileum. The                            colonoscopy was  performed without difficulty. The                            patient tolerated the procedure well. The quality                            of the bowel preparation was good. The terminal                            ileum, ileocecal valve, appendiceal orifice, and                            rectum were photographed. Scope In: 4:21:28 PM Scope Out: 4:37:25 PM Scope Withdrawal Time: 0 hours 9 minutes 40 seconds  Total Procedure Duration: 0 hours 15 minutes 57 seconds  Findings:                 The terminal ileum appeared normal.                           A 5 mm polyp was found in the transverse colon. The                            polyp was sessile. The polyp was removed with a                            cold snare. Resection and retrieval were complete.                           Multiple diverticula were found in the sigmoid                            colon and descending colon.                           Non-bleeding internal hemorrhoids were found during                            retroflexion. Complications:  No immediate complications. Estimated Blood Loss:     Estimated blood loss was minimal. Impression:               - The examined portion of the ileum was normal.                           - One 5 mm polyp in the transverse colon, removed                            with a cold snare. Resected and retrieved.                           - Diverticulosis in the sigmoid colon and in the                            descending colon.                           - Non-bleeding internal hemorrhoids. Recommendation:           - Discharge patient to home (with escort).                           - Await pathology results.                           - The findings and recommendations were discussed                            with the patient. Dr Particia Lather "Alan Ripper" Leonides Schanz,  02/26/2023 4:41:44 PM

## 2023-02-26 NOTE — Progress Notes (Unsigned)
Pt's states no medical or surgical changes since previsit or office visit. 

## 2023-02-26 NOTE — Progress Notes (Unsigned)
GASTROENTEROLOGY PROCEDURE H&P NOTE   Primary Care Physician: Blair Heys, MD (Inactive)    Reason for Procedure:   Colon cancer screening  Plan:    Colonoscopy  Patient is appropriate for endoscopic procedure(s) in the ambulatory ([facility]) setting.  The nature of the procedure, as well as the risks, benefits, and alternatives were carefully and thoroughly reviewed with the patient. Ample time for discussion and questions allowed. The patient understood, was satisfied, and agreed to proceed.     HPI: Laura Mills is a 62 y.o. female who presents for colonoscopy for colon cancer screening. Denies blood in stools, changes in bowel habits, or unintentional weight loss. Denies family history of colon cancer.  Past Medical History:  Diagnosis Date   Allergy    Arthritis    LUMBAR SPINE AND RIGHT HIP   Cataract    GERD (gastroesophageal reflux disease)    History of kidney stones 10/2012   Hyperlipidemia    Hypertension    Renal stone     Past Surgical History:  Procedure Laterality Date   ABDOMINAL HYSTERECTOMY  2000   CHOLECYSTECTOMY  04/09/2007   CYSTOSCOPY WITH URETEROSCOPY Right 10/02/2012   Procedure: CYSTOSCOPY WITH RIGHT RETROGRADE AND STENT PLACEMENT ;  Surgeon: Antony Haste, MD;  Location: [facility];  Service: Urology;  Laterality: Right;   CYSTOSCOPY WITH URETEROSCOPY Right 11/13/2012   Procedure: RIGHT URETEROSCOPY;  Surgeon: Antony Haste, MD;  Location: [facility];  Service: Urology;  Laterality: Right;  with Stent   CYSTOSCOPY/URETEROSCOPY/HOLMIUM LASER/STENT PLACEMENT Right 05/31/2022   Procedure: CYSTOSCOPY RIGHT URETEROSCOPY/HOLMIUM LASER/STENT PLACEMENT;  Surgeon: Jerilee Field, MD;  Location: [facility];  Service: Urology;  Laterality: Right;  60 MINS   LITHOTRIPSY  10/06/2012   PUBOVAGINAL SLING N/A 04/15/2017   Procedure: Leonides Grills;  Surgeon: Jerilee Field, MD;  Location: [facility];  Service: Urology;  Laterality: N/A;   NEEDS 90 MIN FOR PROCEDURE   ROTATOR CUFF REPAIR Left    TYMPANOSTOMY TUBE PLACEMENT Right 2019   to clear up MRSA    Prior to Admission medications   Medication Sig Start Date End Date Taking? Authorizing Provider  atorvastatin (LIPITOR) 40 MG tablet Take 40 mg by mouth every evening.   Yes [provider]  Cholecalciferol (VITAMIN D3) 50 MCG (2000 UT) TABS Take 1 tablet by mouth daily.   Yes [provider]  Cyanocobalamin (VITAMIN B-12 PO) Take 1 tablet by mouth daily.   Yes [provider]  estradiol (ESTRACE) 1 MG tablet Take 1 mg by mouth daily.   Yes [provider]  methocarbamol (ROBAXIN) 500 MG tablet Take 500 mg by mouth every 8 (eight) hours as needed for muscle spasms.    Yes [provider]  metoprolol succinate (TOPROL-XL) 25 MG 24 hr tablet Take 25 mg by mouth every evening.   Yes [provider]  montelukast (SINGULAIR) 10 MG tablet Take 10 mg by mouth at bedtime.   Yes [provider]  Multiple Vitamin (MULTIVITAMIN) capsule Take 1 capsule by mouth daily.   Yes [provider]  traMADol (ULTRAM) 50 MG tablet Take 50 mg by mouth every 8 (eight) hours as needed for moderate pain.   Yes [provider]  valACYclovir (VALTREX) 500 MG tablet Take 500 mg by mouth daily.    Yes [provider]  HYDROcodone-acetaminophen (NORCO/VICODIN) 5-325 MG tablet Take 2 tablets by mouth every 6 (six) hours as needed. Patient not taking: Reported on 02/13/2023 03/31/22   Schutt,  Edsel Petrin, PA-C  OVER THE COUNTER MEDICATION Calcium one capsule daily.    [provider]    Current Outpatient Medications  Medication Sig Dispense Refill   atorvastatin (LIPITOR) 40 MG tablet Take 40 mg by mouth every evening.     Cholecalciferol (VITAMIN D3) 50 MCG (2000 UT) TABS Take 1 tablet by mouth daily.     Cyanocobalamin (VITAMIN B-12 PO) Take 1 tablet by mouth daily.     estradiol (ESTRACE) 1 MG tablet  Take 1 mg by mouth daily.     methocarbamol (ROBAXIN) 500 MG tablet Take 500 mg by mouth every 8 (eight) hours as needed for muscle spasms.      metoprolol succinate (TOPROL-XL) 25 MG 24 hr tablet Take 25 mg by mouth every evening.     montelukast (SINGULAIR) 10 MG tablet Take 10 mg by mouth at bedtime.     Multiple Vitamin (MULTIVITAMIN) capsule Take 1 capsule by mouth daily.     traMADol (ULTRAM) 50 MG tablet Take 50 mg by mouth every 8 (eight) hours as needed for moderate pain.     valACYclovir (VALTREX) 500 MG tablet Take 500 mg by mouth daily.      HYDROcodone-acetaminophen (NORCO/VICODIN) 5-325 MG tablet Take 2 tablets by mouth every 6 (six) hours as needed. (Patient not taking: Reported on 02/13/2023) 10 tablet 0   OVER THE COUNTER MEDICATION Calcium one capsule daily.     Current Facility-Administered Medications  Medication Dose Route Frequency Provider Last Rate Last Admin   0.9 %  sodium chloride infusion  500 mL Intravenous Once Imogene Burn, MD        Allergies as of 02/26/2023 - Review Complete 02/26/2023  Allergen Reaction Noted   Ciprofloxacin Other (See Comments) 09/28/2012   Macrodantin [nitrofurantoin] Hives 09/28/2012   Minocin [minocycline hcl] Hives 12/31/2011    Family History  Problem Relation Age of Onset   Colon cancer Neg Hx    Esophageal cancer Neg Hx    Rectal cancer Neg Hx    Stomach cancer Neg Hx     Social History   Socioeconomic History   Marital status: Married    Spouse name: Not on file   Number of children: Not on file   Years of education: Not on file   Highest education level: Not on file  Occupational History   Not on file  Tobacco Use   Smoking status: Never   Smokeless tobacco: Never  Vaping Use   Vaping status: Never Used  Substance and Sexual Activity   Alcohol use: No   Drug use: No   Sexual activity: Not on file  Other Topics Concern   Not on file  Social History Narrative   Not on file   Social Determinants of  Health   Financial Resource Strain: Not on file  Food Insecurity: Not on file  Transportation Needs: Not on file  Physical Activity: Not on file  Stress: Not on file  Social Connections: Unknown (08/21/2021)   Received from [facility]   Social Network    Social Network: Not on file  Intimate Partner Violence: Unknown (07/13/2021)   Received [facility]   HITS    Physically Hurt: Not on file    Insult or Talk Down To: Not on file    Threaten Physical Harm: Not on file    Scream or Curse: Not on file    Physical Exam: Vital signs in last 24 hours: BP 120/82  Pulse 81   Temp 97.8 F (36.6 C) (Temporal)   Ht 5\' 1"  (1.549 m)   Wt 150 lb (68 kg)   SpO2 97%   BMI 28.34 kg/m  GEN: NAD EYE: Sclerae anicteric ENT: MMM CV: Non-tachycardic Pulm: No increased work of breathing GI: Soft, NT/ND NEURO:  Alert & Oriented   Eulah Pont, MD [facility]  02/26/2023 4:10 PM

## 2023-02-26 NOTE — Patient Instructions (Addendum)
Discharge patient to home (with escort).                           - Await pathology results.                           - The findings and recommendations were discussed                            with the patient. Handout on polyps and diverticulosis given.    YOU HAD AN ENDOSCOPIC PROCEDURE TODAY AT THE [facility]:   Refer to the procedure report that was given to you for any specific questions about what was found during the examination.  If the procedure report does not answer your questions, please call your gastroenterologist to clarify.  If you requested that your care partner not be given the details of your procedure findings, then the procedure report has been included in a sealed envelope for you to review at your convenience later.  YOU SHOULD EXPECT: Some feelings of bloating in the abdomen. Passage of more gas than usual.  Walking can help get rid of the air that was put into your GI tract during the procedure and reduce the bloating. If you had a lower endoscopy (such as a colonoscopy or flexible sigmoidoscopy) you may notice spotting of blood in your stool or on the toilet paper. If you underwent a bowel prep for your procedure, you may not have a normal bowel movement for a few days.  Please Note:  You might notice some irritation and congestion in your nose or some drainage.  This is from the oxygen used during your procedure.  There is no need for concern and it should clear up in a day or so.  SYMPTOMS TO REPORT IMMEDIATELY:  Following lower endoscopy (colonoscopy or flexible sigmoidoscopy):  Excessive amounts of blood in the stool  Significant tenderness or worsening of abdominal pains  Swelling of the abdomen that is new, acute  Fever of 100F or higher   For urgent or emergent issues, a gastroenterologist can be reached at any hour by calling [phone]. Do not use MyChart messaging for urgent concerns.    DIET:  We do recommend a small meal at  first, but then you may proceed to your regular diet.  Drink plenty of fluids but you should avoid alcoholic beverages for 24 hours.  ACTIVITY:  You should plan to take it easy for the rest of today and you should NOT DRIVE or use heavy machinery until tomorrow (because of the sedation medicines used during the test).    FOLLOW UP: Our staff will call the number listed on your records the next business day following your procedure.  We will call around 7:15- 8:00 am to check on you and address any questions or concerns that you may have regarding the information given to you following your procedure. If we do not reach you, we will leave a message.     If any biopsies were taken you will be contacted by phone or by letter within the next 1-3 weeks.  Please call us at [phone] if you have not heard about the biopsies in 3 weeks.    SIGNATURES/CONFIDENTIALITY: You and/or your care partner have signed paperwork which will be entered into your electronic medical record.  These signatures attest to  the fact that that the information above on your After Visit Summary has been reviewed and is understood.  Full responsibility of the confidentiality of this discharge information lies with you and/or your care-partner.

## 2023-02-27 ENCOUNTER — Telehealth: Payer: Self-pay | Admitting: *Deleted

## 2023-02-27 NOTE — Telephone Encounter (Signed)
  Follow up Call-     02/26/2023    3:26 PM  Call back number  Post procedure Call Back phone  # [phone]  Permission to leave phone message Yes     Patient questions:  Do you have a fever, pain , or abdominal swelling? No. Pain Score  0 *  Have you tolerated food without any problems? Yes.    Have you been able to return to your normal activities? Yes.    Do you have any questions about your discharge instructions: Diet   No. Medications  No. Follow up visit  No.  Do you have questions or concerns about your Care? No.  Actions: * If pain score is 4 or above: No action needed, pain <4.

## 2023-03-03 ENCOUNTER — Encounter: Payer: Self-pay | Admitting: Internal Medicine

## 2023-03-03 LAB — SURGICAL PATHOLOGY

## 2023-04-30 DIAGNOSIS — M170 Bilateral primary osteoarthritis of knee: Secondary | ICD-10-CM | POA: Diagnosis not present

## 2023-05-22 DIAGNOSIS — R051 Acute cough: Secondary | ICD-10-CM | POA: Diagnosis not present

## 2023-05-22 DIAGNOSIS — J019 Acute sinusitis, unspecified: Secondary | ICD-10-CM | POA: Diagnosis not present

## 2023-05-22 DIAGNOSIS — J029 Acute pharyngitis, unspecified: Secondary | ICD-10-CM | POA: Diagnosis not present

## 2023-05-22 DIAGNOSIS — R0981 Nasal congestion: Secondary | ICD-10-CM | POA: Diagnosis not present

## 2023-06-02 DIAGNOSIS — M47817 Spondylosis without myelopathy or radiculopathy, lumbosacral region: Secondary | ICD-10-CM | POA: Diagnosis not present

## 2023-07-05 DIAGNOSIS — H9201 Otalgia, right ear: Secondary | ICD-10-CM | POA: Diagnosis not present

## 2023-07-05 DIAGNOSIS — L299 Pruritus, unspecified: Secondary | ICD-10-CM | POA: Diagnosis not present

## 2023-08-15 DIAGNOSIS — H7201 Central perforation of tympanic membrane, right ear: Secondary | ICD-10-CM | POA: Diagnosis not present

## 2023-08-15 DIAGNOSIS — Z01118 Encounter for examination of ears and hearing with other abnormal findings: Secondary | ICD-10-CM | POA: Diagnosis not present

## 2023-08-15 DIAGNOSIS — Z9622 Myringotomy tube(s) status: Secondary | ICD-10-CM | POA: Diagnosis not present

## 2023-08-15 DIAGNOSIS — H8101 Meniere's disease, right ear: Secondary | ICD-10-CM | POA: Diagnosis not present

## 2023-08-15 DIAGNOSIS — H90A31 Mixed conductive and sensorineural hearing loss, unilateral, right ear with restricted hearing on the contralateral side: Secondary | ICD-10-CM | POA: Diagnosis not present

## 2023-08-15 DIAGNOSIS — H6691 Otitis media, unspecified, right ear: Secondary | ICD-10-CM | POA: Diagnosis not present

## 2023-08-15 DIAGNOSIS — H6991 Unspecified Eustachian tube disorder, right ear: Secondary | ICD-10-CM | POA: Diagnosis not present

## 2023-08-15 DIAGNOSIS — Z9089 Acquired absence of other organs: Secondary | ICD-10-CM | POA: Diagnosis not present

## 2023-08-29 DIAGNOSIS — R1084 Generalized abdominal pain: Secondary | ICD-10-CM | POA: Diagnosis not present

## 2023-08-29 DIAGNOSIS — Z87442 Personal history of urinary calculi: Secondary | ICD-10-CM | POA: Diagnosis not present

## 2023-08-29 DIAGNOSIS — R109 Unspecified abdominal pain: Secondary | ICD-10-CM | POA: Diagnosis not present

## 2023-09-06 DIAGNOSIS — R051 Acute cough: Secondary | ICD-10-CM | POA: Diagnosis not present

## 2023-09-06 DIAGNOSIS — J40 Bronchitis, not specified as acute or chronic: Secondary | ICD-10-CM | POA: Diagnosis not present

## 2023-09-26 DIAGNOSIS — Z9089 Acquired absence of other organs: Secondary | ICD-10-CM | POA: Diagnosis not present

## 2023-09-26 DIAGNOSIS — H6991 Unspecified Eustachian tube disorder, right ear: Secondary | ICD-10-CM | POA: Diagnosis not present

## 2023-09-26 DIAGNOSIS — H8101 Meniere's disease, right ear: Secondary | ICD-10-CM | POA: Diagnosis not present

## 2023-09-26 DIAGNOSIS — H90A31 Mixed conductive and sensorineural hearing loss, unilateral, right ear with restricted hearing on the contralateral side: Secondary | ICD-10-CM | POA: Diagnosis not present

## 2023-10-31 DIAGNOSIS — M170 Bilateral primary osteoarthritis of knee: Secondary | ICD-10-CM | POA: Diagnosis not present

## 2023-11-13 DIAGNOSIS — M1712 Unilateral primary osteoarthritis, left knee: Secondary | ICD-10-CM | POA: Diagnosis not present

## 2023-11-13 DIAGNOSIS — M6281 Muscle weakness (generalized): Secondary | ICD-10-CM | POA: Diagnosis not present

## 2023-11-13 DIAGNOSIS — R262 Difficulty in walking, not elsewhere classified: Secondary | ICD-10-CM | POA: Diagnosis not present

## 2023-11-17 DIAGNOSIS — M1712 Unilateral primary osteoarthritis, left knee: Secondary | ICD-10-CM | POA: Diagnosis not present

## 2023-11-17 DIAGNOSIS — M25562 Pain in left knee: Secondary | ICD-10-CM | POA: Diagnosis not present

## 2023-12-04 DIAGNOSIS — G8918 Other acute postprocedural pain: Secondary | ICD-10-CM | POA: Diagnosis not present

## 2023-12-04 DIAGNOSIS — M25762 Osteophyte, left knee: Secondary | ICD-10-CM | POA: Diagnosis not present

## 2023-12-04 DIAGNOSIS — M1712 Unilateral primary osteoarthritis, left knee: Secondary | ICD-10-CM | POA: Diagnosis not present

## 2023-12-08 DIAGNOSIS — M1712 Unilateral primary osteoarthritis, left knee: Secondary | ICD-10-CM | POA: Diagnosis not present

## 2023-12-09 DIAGNOSIS — M1712 Unilateral primary osteoarthritis, left knee: Secondary | ICD-10-CM | POA: Diagnosis not present

## 2023-12-09 DIAGNOSIS — Z96652 Presence of left artificial knee joint: Secondary | ICD-10-CM | POA: Diagnosis not present

## 2023-12-11 ENCOUNTER — Other Ambulatory Visit: Payer: Self-pay

## 2023-12-11 ENCOUNTER — Ambulatory Visit (HOSPITAL_BASED_OUTPATIENT_CLINIC_OR_DEPARTMENT_OTHER): Payer: Self-pay | Attending: Orthopedic Surgery | Admitting: Physical Therapy

## 2023-12-11 DIAGNOSIS — R262 Difficulty in walking, not elsewhere classified: Secondary | ICD-10-CM | POA: Diagnosis not present

## 2023-12-11 DIAGNOSIS — Z96652 Presence of left artificial knee joint: Secondary | ICD-10-CM | POA: Insufficient documentation

## 2023-12-11 DIAGNOSIS — M25662 Stiffness of left knee, not elsewhere classified: Secondary | ICD-10-CM | POA: Insufficient documentation

## 2023-12-11 DIAGNOSIS — M6281 Muscle weakness (generalized): Secondary | ICD-10-CM | POA: Diagnosis not present

## 2023-12-11 DIAGNOSIS — Z471 Aftercare following joint replacement surgery: Secondary | ICD-10-CM | POA: Diagnosis not present

## 2023-12-11 DIAGNOSIS — M25562 Pain in left knee: Secondary | ICD-10-CM | POA: Diagnosis not present

## 2023-12-11 NOTE — Therapy (Signed)
 OUTPATIENT PHYSICAL THERAPY LOWER EXTREMITY EVALUATION   Patient Name: Laura Mills MRN: [id number] DOB:1960-10-17, 63 y.o., female Today's Date: 12/12/2023  END OF SESSION:  PT End of Session - 12/11/23 1016     Visit Number 1    Number of Visits 22    Date for PT Re-Evaluation 02/19/24    Authorization Type AETNA    PT Start Time 0853    PT Stop Time 0944    PT Time Calculation (min) 51 min    Activity Tolerance Patient tolerated treatment well    Behavior During Therapy [facility] for tasks assessed/performed          Past Medical History:  Diagnosis Date   Allergy    Arthritis    LUMBAR SPINE AND RIGHT HIP   Cataract    GERD (gastroesophageal reflux disease)    History of kidney stones 10/2012   Hyperlipidemia    Hypertension    Renal stone    Past Surgical History:  Procedure Laterality Date   ABDOMINAL HYSTERECTOMY  2000   CHOLECYSTECTOMY  04/09/2007   CYSTOSCOPY WITH URETEROSCOPY Right 10/02/2012   Procedure: CYSTOSCOPY WITH RIGHT RETROGRADE AND STENT PLACEMENT ;  Surgeon: Donnice Gwenyth Brooks, MD;  Location: [facility];  Service: Urology;  Laterality: Right;   CYSTOSCOPY WITH URETEROSCOPY Right 11/13/2012   Procedure: RIGHT URETEROSCOPY;  Surgeon: Donnice Gwenyth Brooks, MD;  Location: [facility];  Service: Urology;  Laterality: Right;  with Stent   CYSTOSCOPY/URETEROSCOPY/HOLMIUM LASER/STENT PLACEMENT Right 05/31/2022   Procedure: CYSTOSCOPY RIGHT URETEROSCOPY/HOLMIUM LASER/STENT PLACEMENT;  Surgeon: Brooks Donnice, MD;  Location: [facility];  Service: Urology;  Laterality: Right;  60 MINS   LITHOTRIPSY  10/06/2012   PUBOVAGINAL SLING N/A 04/15/2017   Procedure: CARLOYN GLADE;  Surgeon: Brooks Donnice, MD;  Location: [facility];  Service: Urology;  Laterality: N/A;  NEEDS 90 MIN FOR PROCEDURE   ROTATOR CUFF REPAIR Left    TYMPANOSTOMY TUBE PLACEMENT Right 2019   to clear up MRSA   There are no active problems to display for this patient.   REFERRING PROVIDER:  Beverley Evalene BIRCH, MD  REFERRING DIAG: L TKR  THERAPY DIAG:  Left knee pain, unspecified chronicity  Stiffness of left knee, not elsewhere classified  Muscle weakness (generalized)  Difficulty in walking, not elsewhere classified  Rationale for Evaluation and Treatment: Rehabilitation  ONSET DATE: DOS  12/04/2023  SUBJECTIVE:   SUBJECTIVE STATEMENT: Pt states she has had bilat knee pain for at least 2.5 years.  Her L knee pain worsened which limited her walking.  She performed aquatic exercises to help.  Pt underwent L TKR on 8/28 and plans to have a R TKR in December.  PT order indicated improve motion, strength, and ambulation s/p TKR.  Pt has been using the CPM for 7-8 hours and has reached 90 deg.  Pt is also using the bone foam pad for extension.  Pt is using ice and taking tramadol.  Pt did take oxy this AM but was the first time in 4-5 days.  Pt is limited with functional mobility skills including ambulation and transfers.  Pt is ambulating with a FWW.  She is limited with performing her daily activities and mobility.  Pt reports having a fall in the bathroom yesterday when she grabbed the towel bar which broke off the wall.  Pt reports falling onto her R side.  She denies any increased pain.   PERTINENT HISTORY: L TKR  12/04/2023 Severe arthritis R knee which pt receives injections,  plan for R TKR in December Arthritis in lumbar and R hip also HTN  PAIN:  NPRS:  2-3/10 current, 8-9/10 wors, 2-3/10 best L knee  PRECAUTIONS: per dx; R knee OA--planning for R knee TKR   WEIGHT BEARING RESTRICTIONS: FWB  FALLS:  Has patient fallen in last 6 months? Yes. Number of falls 2, R ankle rolled with new shoes ;  fell in the bathroom when the towel bar broke off the wall  LIVING ENVIRONMENT: Lives with: lives with their spouse Lives in: 1 story home Stairs: 3 steps to enter home with 1 rail Has following equipment at home: FWW, crutches, SPC, 3 pronged cane  OCCUPATION:  part time home care, primarily sitting  PLOF: Independent  PATIENT GOALS: to improve pain and mobility   OBJECTIVE:  Note: Objective measures were completed at Evaluation unless otherwise noted.  DIAGNOSTIC FINDINGS: Pt is post op.  PATIENT SURVEYS:  LEFS:  28/80  COGNITION: Overall cognitive status: Within functional limits for tasks assessed      OBSERVATION: Bruising in L thigh and distal LE including anterior and medial shin.  Aquacell bandage in place over incision.  Pt has 2 small spots of drainage at proximal bandage, but nothing abnormal.  Pt not wearing compression stockings though states she has been wearing them.     LOWER EXTREMITY ROM:   ROM Right eval Left eval  Hip flexion    Hip extension    Hip abduction    Hip adduction    Hip internal rotation    Hip external rotation    Knee flexion  69/88 AROM/PROM  Knee extension  0 deg AROM  Ankle dorsiflexion    Ankle plantarflexion    Ankle inversion    Ankle eversion     (Blank rows = not tested)  LOWER EXTREMITY MMT:  Strength not tested due to recent surgery and post op considerations.    FUNCTIONAL TESTS:  Pt performs sit to/from stand transfers with L LE out in front.   GAIT: Assistive device utilized: Walker - 2 wheeled Comments: Pt ambulating well with FWW.  She is using increased support with Ue's on FWW to decrease Wb'ing on L LE as expected.  PT educated pt and husband in correct height of walker and adjusted walker to appropriate height.                                                                                                                                TREATMENT:   Pt performed quad sets with a 5 sec hold approx 12 reps, glute sets with a 5 sec hold x 10 reps, ankle pumps x 20 reps, longsitting heel slides x 10 reps, and supine heel slides approx 5 reps.  Pt received a HEP handout and was educated in correct form and appropriate frequency.  PT instructed in appropriate ROM for knee  flexion.    PATIENT EDUCATION:  Education details:  Instructed  pt to not put a pillow under knee.  Instructed pt to wear compression stockings.  Educated pt in post op protocol restrictions and expectations.  HEP, objective findings, dx, prognosis, and POC.  PT answered pt's questions.  Person educated: Patient Education method: Explanation, Demonstration, Tactile cues, Verbal cues, and Handouts Education comprehension: verbalized understanding, returned demonstration, verbal cues required, and tactile cues required  HOME EXERCISE PROGRAM: Access Code: [email or web address] URL: [email or web address] Date: 12/12/2023 Prepared by: Mose Minerva  Exercises - Supine Quadricep Sets  - 2 x daily - 7 x weekly - 2 sets - 10 reps - 5 seconds hold - Supine Gluteal Sets  - 2 x daily - 7 x weekly - 2 sets - 10 reps - 5 seconds hold - Supine Ankle Pumps  - 7 x weekly - Longsitting Heel Slide  - 2-3 x daily - 7 x weekly - 1 sets - 10 reps -Sitting with knee flexed for 3-5 mins comfortably at various times for 6-7 times/day    ASSESSMENT:  CLINICAL IMPRESSION: Patient is a 63 y.o. female 1 week s/p L TKR presenting to the clinic with expected post op findings of L knee pain, limited L knee ROM, muscle weakness in L LE, and difficulty in walking.  Pt has arthritis in lumbar, R hip, and R knee also.  Pt is planning to have a R TKR later this year.  She is limited with performing her ADLs, IADLs, and functional mobility skills.  Pt is ambulating with a FWW.  She has been compliant with using the CPM, bone foam pad, and ice.  She will benefit from skilled PT per protocol to address impairments and improve overall function.     OBJECTIVE IMPAIRMENTS: Abnormal gait, decreased activity tolerance, decreased endurance, decreased knowledge of use of DME, decreased mobility, difficulty walking, decreased ROM, decreased strength, hypomobility, increased edema, impaired flexibility, and pain.   ACTIVITY  LIMITATIONS: bending, sitting, standing, squatting, stairs, transfers, bed mobility, dressing, and locomotion level  PARTICIPATION LIMITATIONS: meal prep, cleaning, laundry, driving, shopping, and community activity  PERSONAL FACTORS: 1 comorbidity: Arthritis including in R knee are also affecting patient's functional outcome.   REHAB POTENTIAL: Good  CLINICAL DECISION MAKING: Stable/uncomplicated  EVALUATION COMPLEXITY: Low   GOALS:   SHORT TERM GOALS:  Pt will be independent and compliant with HEP for improved pain, ROM, strength, and function.  Baseline: Goal status: INITIAL Target date:  01/01/24   2.  Pt will demo a good quad set and be able to perform a supine SLR independently for improved strength and stability with gait.  Baseline:  Goal status: INITIAL Target date: 12/25/2023  3.  Pt will demo improved L knee flexion AROM to 95 deg for improved gait, stiffness, and mobility.  Baseline:  Goal status: INITIAL Target date:  01/08/2024  4.  Pt will demo improved quality of gait and ambulate with a SPC with good stability.  Baseline:  Goal status: INITIAL Target date:  01/08/2024   5.  Pt will be able to perform her normal transfers with no > than minimal difficulty.   Baseline:  Goal status: INITIAL    LONG TERM GOALS: Target date: 02/19/2024  Pt will demo improved R knee AROM to 0 - 115 deg for improved mobility and stiffness and for improved performance of stairs.  Baseline:  Goal status: INITIAL  2.  Pt will ambulate with a normalized heel to toe gait without limping.  Baseline:  Goal status: INITIAL  3.   Pt will  ambulate community distance without an AD without significant L knee pain and difficulty.  Baseline:  Goal status: INITIAL  4.   Pt will be able to perform household chores without significant L knee pain and difficulty.  Baseline:  Goal status: INITIAL  5.  Pt will demo improved strength to 4+/5 in R hip abd, 5/5 in Hip flexion, and 5/5  in knee ext and flexion for improved performance of daily functional mobility and to assist with returning to recreational activities.  Baseline:  Goal status: INITIAL  6.  Pt will be able to perform 4-5 steps with a reciprocal gait with the rail for improved performance of stairs.  Baseline:  Goal status: INITIAL   PLAN:  PT FREQUENCY: 2-3x/wk x 3 weeks and 2x/wk afterwards   PT DURATION: 10 weeks  PLANNED INTERVENTIONS: 97164- PT Re-evaluation, 97750- Physical Performance Testing, 97110-Therapeutic exercises, 97530- Therapeutic activity, 97112- Neuromuscular re-education, 97535- Self Care, 02859- Manual therapy, [phone]- Gait training, [phone]- Aquatic Therapy, [phone]- Electrical stimulation (unattended), Patient/Family education, Balance training, Stair training, Taping, Joint mobilization, Spinal mobilization, Scar mobilization, DME instructions, Cryotherapy, and Moist heat  PLAN FOR NEXT SESSION: Cont per TKR protocol.  Gait training.   Leigh Minerva III PT, DPT 12/12/23 12:32 PM

## 2023-12-12 ENCOUNTER — Encounter (HOSPITAL_BASED_OUTPATIENT_CLINIC_OR_DEPARTMENT_OTHER): Payer: Self-pay | Admitting: Physical Therapy

## 2023-12-14 NOTE — Therapy (Signed)
 OUTPATIENT PHYSICAL THERAPY LOWER EXTREMITY EVALUATION   Patient Name: Laura Mills MRN: [id number] DOB:[birth year], 63 y.o., female Today's Date: 12/16/2023  END OF SESSION:  PT End of Session - 12/15/23 1420     Visit Number 2    Number of Visits 22    Date for PT Re-Evaluation 02/19/24    Authorization Type AETNA    PT Start Time 1411    PT Stop Time 1450    PT Time Calculation (min) 39 min    Activity Tolerance Patient tolerated treatment well    Behavior During Therapy WFL for tasks assessed/performed           Past Medical History:  Diagnosis Date   Allergy    Arthritis    LUMBAR SPINE AND RIGHT HIP   Cataract    GERD (gastroesophageal reflux disease)    History of kidney stones 10/2012   Hyperlipidemia    Hypertension    Renal stone    Past Surgical History:  Procedure Laterality Date   ABDOMINAL HYSTERECTOMY  2000   CHOLECYSTECTOMY  04/09/2007   CYSTOSCOPY WITH URETEROSCOPY Right 10/02/2012   Procedure: CYSTOSCOPY WITH RIGHT RETROGRADE AND STENT PLACEMENT ;  Surgeon: Donnice Gwenyth Brooks, MD;  Location: [facility];  Service: Urology;  Laterality: Right;   CYSTOSCOPY WITH URETEROSCOPY Right 11/13/2012   Procedure: RIGHT URETEROSCOPY;  Surgeon: Donnice Gwenyth Brooks, MD;  Location: [facility];  Service: Urology;  Laterality: Right;  with Stent   CYSTOSCOPY/URETEROSCOPY/HOLMIUM LASER/STENT PLACEMENT Right 05/31/2022   Procedure: CYSTOSCOPY RIGHT URETEROSCOPY/HOLMIUM LASER/STENT PLACEMENT;  Surgeon: Brooks Donnice, MD;  Location: [facility];  Service: Urology;  Laterality: Right;  60 MINS   LITHOTRIPSY  10/06/2012   PUBOVAGINAL SLING N/A 04/15/2017   Procedure: CARLOYN GLADE;  Surgeon: Brooks Donnice, MD;  Location: [facility];  Service: Urology;  Laterality: N/A;  NEEDS 90 MIN FOR PROCEDURE   ROTATOR CUFF REPAIR Left    TYMPANOSTOMY TUBE PLACEMENT Right 2019   to clear up MRSA   There are no active problems to display for this patient.   REFERRING  PROVIDER: Beverley Evalene BIRCH, MD  REFERRING DIAG: L TKR  THERAPY DIAG:  Left knee pain, unspecified chronicity  Stiffness of left knee, not elsewhere classified  Muscle weakness (generalized)  Difficulty in walking, not elsewhere classified  Rationale for Evaluation and Treatment: Rehabilitation  ONSET DATE: DOS  12/04/2023  SUBJECTIVE:   SUBJECTIVE STATEMENT: Pt is 1 week and 4 days s/p L TKR.  Pt denies any adverse effects after prior Rx.  Pt states she has been performing her HEP without adverse effects.  She has some difficulty with heel slides in her posterior knee when she is wearing her compression hose.   Pt states her bruising is improving.  Pt has been wrapping her leg when taking shower and the bandage has been dry.  Pt has an appt with MD on Thursday.  Pt states she was able to don her shoe independently today.  Pt states she is also able to lift her leg without using the strap.      Pt is using the CPM, but not as much due to performing her home exercises.  She is using the bone foam pad for extension.  Pt is also using ice and taking tramadol.     PERTINENT HISTORY: L TKR  12/04/2023 Severe arthritis R knee which pt receives injections, plan for R TKR in December Arthritis in lumbar and R hip also HTN  PAIN:  NPRS:  3-4/10 in knee, 6/10 at incision   PRECAUTIONS: per dx; R knee OA--planning for R knee TKR   WEIGHT BEARING RESTRICTIONS: FWB  FALLS:  Has patient fallen in last 6 months? Yes. Number of falls 2, R ankle rolled with new shoes ;  fell in the bathroom when the towel bar broke off the wall  LIVING ENVIRONMENT: Lives with: lives with their spouse Lives in: 1 story home Stairs: 3 steps to enter home with 1 rail Has following equipment at home: FWW, crutches, SPC, 3 pronged cane  OCCUPATION: part time home care, primarily sitting  PLOF: Independent  PATIENT GOALS: to improve pain and mobility   OBJECTIVE:  Note: Objective measures were  completed at Evaluation unless otherwise noted.  DIAGNOSTIC FINDINGS: Pt is post op.  PATIENT SURVEYS:  LEFS:  28/80  COGNITION: Overall cognitive status: Within functional limits for tasks assessed      OBSERVATION: Bruising in L thigh and distal LE including anterior and medial shin.  Aquacell bandage in place over incision.  Pt has 2 small spots of drainage at proximal bandage, but nothing abnormal.  Pt not wearing compression stockings though states she has been wearing them.     LOWER EXTREMITY ROM:   ROM Right eval Left eval Left 9/8  Hip flexion     Hip extension     Hip abduction     Hip adduction     Hip internal rotation     Hip external rotation     Knee flexion  69/88 AROM/PROM 97 deg  Knee extension  0 deg AROM 0 deg AROM  Ankle dorsiflexion     Ankle plantarflexion     Ankle inversion     Ankle eversion      (Blank rows = not tested)  LOWER EXTREMITY MMT:  Strength not tested due to recent surgery and post op considerations.    FUNCTIONAL TESTS:  Pt performs sit to/from stand transfers with L LE out in front.   GAIT: Assistive device utilized: Walker - 2 wheeled Comments: Pt ambulating well with FWW.  She is using increased support with Ue's on FWW to decrease Wb'ing on L LE as expected.  PT educated pt and husband in correct height of walker and adjusted walker to appropriate height.                                                                                                                                TREATMENT:   Reviewed compliance with HEP, pain level, response to prior Rx, and current function.   Pt received L knee flexion PROM seated at edge of table per pt and tissue tolerance.   Pt performed: Seated heel slides 2x10 reps quad sets with a 5 sec hold x 10 reps glute sets with a 5 sec hold x 10 reps Pt unable to perform supine SLR SAQ 3x10 supine heel slides 2x10 reps.    Pt showed  PT how the the distal portion of her aquacel  bandage was coming off her skin if she pulled it.  PT placed tegaderm at distal portion of aquacel bandage to keep bandage in place over the incision.     PATIENT EDUCATION:  Education details:  Instructed pt to not put a pillow under knee.  Instructed pt to wear compression stockings.  Educated pt in post op protocol restrictions and expectations.  HEP, objective findings, gait training, bandage dressing, dx, prognosis, and POC.  PT answered pt's questions.  Person educated: Patient Education method: Explanation, Demonstration, Tactile cues, Verbal cues, and Handouts Education comprehension: verbalized understanding, returned demonstration, verbal cues required, and tactile cues required  HOME EXERCISE PROGRAM: Access Code: [email or web address] URL: [email or web address] Date: 12/12/2023 Prepared by: Mose Minerva  Exercises - Supine Quadricep Sets  - 2 x daily - 7 x weekly - 2 sets - 10 reps - 5 seconds hold - Supine Gluteal Sets  - 2 x daily - 7 x weekly - 2 sets - 10 reps - 5 seconds hold - Supine Ankle Pumps  - 7 x weekly - Longsitting Heel Slide  - 2-3 x daily - 7 x weekly - 1 sets - 10 reps -Sitting with knee flexed for 3-5 mins comfortably at various times for 6-7 times/day    ASSESSMENT:  CLINICAL IMPRESSION: Patient has been using CPM at home and performing home exercises.  She is making excellent progress with knee ROM.  She continues to demonstrate 0 deg of knee extension and demonstrated much improved active knee flexion ROM.  PT reviewed HEP and pt demonstrates good performance of HEP.  Pt was unable to perform supine SLR.  Pt performed exercises per protocol well with instruction and cuing for correct form.  PT also instructed her in heel to toe gait and correct step placement inside walker.  She responded well to treatment having no c/o's after treatment.  She will benefit from skilled PT per protocol to address impairments and improve overall function.     OBJECTIVE  IMPAIRMENTS: Abnormal gait, decreased activity tolerance, decreased endurance, decreased knowledge of use of DME, decreased mobility, difficulty walking, decreased ROM, decreased strength, hypomobility, increased edema, impaired flexibility, and pain.   ACTIVITY LIMITATIONS: bending, sitting, standing, squatting, stairs, transfers, bed mobility, dressing, and locomotion level  PARTICIPATION LIMITATIONS: meal prep, cleaning, laundry, driving, shopping, and community activity  PERSONAL FACTORS: 1 comorbidity: Arthritis including in R knee are also affecting patient's functional outcome.   REHAB POTENTIAL: Good  CLINICAL DECISION MAKING: Stable/uncomplicated  EVALUATION COMPLEXITY: Low   GOALS:   SHORT TERM GOALS:  Pt will be independent and compliant with HEP for improved pain, ROM, strength, and function.  Baseline: Goal status: INITIAL Target date:  01/01/24   2.  Pt will demo a good quad set and be able to perform a supine SLR independently for improved strength and stability with gait.  Baseline:  Goal status: INITIAL Target date: 12/25/2023  3.  Pt will demo improved L knee flexion AROM to 95 deg for improved gait, stiffness, and mobility.  Baseline:  Goal status: INITIAL Target date:  01/08/2024  4.  Pt will demo improved quality of gait and ambulate with a SPC with good stability.  Baseline:  Goal status: INITIAL Target date:  01/08/2024   5.  Pt will be able to perform her normal transfers with no > than minimal difficulty.   Baseline:  Goal status: INITIAL    LONG TERM GOALS: Target date: 02/19/2024  Pt will demo improved R knee AROM to 0 - 115 deg for improved mobility and stiffness and for improved performance of stairs.  Baseline:  Goal status: INITIAL  2.  Pt will ambulate with a normalized heel to toe gait without limping.  Baseline:  Goal status: INITIAL  3.   Pt will ambulate community distance without an AD without significant L knee pain and  difficulty.  Baseline:  Goal status: INITIAL  4.   Pt will be able to perform household chores without significant L knee pain and difficulty.  Baseline:  Goal status: INITIAL  5.  Pt will demo improved strength to 4+/5 in R hip abd, 5/5 in Hip flexion, and 5/5 in knee ext and flexion for improved performance of daily functional mobility and to assist with returning to recreational activities.  Baseline:  Goal status: INITIAL  6.  Pt will be able to perform 4-5 steps with a reciprocal gait with the rail for improved performance of stairs.  Baseline:  Goal status: INITIAL   PLAN:  PT FREQUENCY: 2-3x/wk x 3 weeks and 2x/wk afterwards   PT DURATION: 10 weeks  PLANNED INTERVENTIONS: 97164- PT Re-evaluation, 97750- Physical Performance Testing, 97110-Therapeutic exercises, 97530- Therapeutic activity, 97112- Neuromuscular re-education, 97535- Self Care, 02859- Manual therapy, [phone]- Gait training, [phone]- Aquatic Therapy, [phone]- Electrical stimulation (unattended), Patient/Family education, Balance training, Stair training, Taping, Joint mobilization, Spinal mobilization, Scar mobilization, DME instructions, Cryotherapy, and Moist heat  PLAN FOR NEXT SESSION: Cont per TKR protocol.  Gait training.   Leigh Minerva III PT, DPT 12/16/23 8:50 PM

## 2023-12-15 ENCOUNTER — Ambulatory Visit (HOSPITAL_BASED_OUTPATIENT_CLINIC_OR_DEPARTMENT_OTHER): Admitting: Physical Therapy

## 2023-12-15 ENCOUNTER — Encounter (HOSPITAL_BASED_OUTPATIENT_CLINIC_OR_DEPARTMENT_OTHER): Payer: Self-pay | Admitting: Physical Therapy

## 2023-12-15 ENCOUNTER — Ambulatory Visit (HOSPITAL_BASED_OUTPATIENT_CLINIC_OR_DEPARTMENT_OTHER): Payer: Self-pay | Admitting: Physical Therapy

## 2023-12-15 DIAGNOSIS — R262 Difficulty in walking, not elsewhere classified: Secondary | ICD-10-CM | POA: Diagnosis not present

## 2023-12-15 DIAGNOSIS — Z96652 Presence of left artificial knee joint: Secondary | ICD-10-CM | POA: Diagnosis not present

## 2023-12-15 DIAGNOSIS — M25662 Stiffness of left knee, not elsewhere classified: Secondary | ICD-10-CM

## 2023-12-15 DIAGNOSIS — Z471 Aftercare following joint replacement surgery: Secondary | ICD-10-CM | POA: Diagnosis not present

## 2023-12-15 DIAGNOSIS — M6281 Muscle weakness (generalized): Secondary | ICD-10-CM | POA: Diagnosis not present

## 2023-12-15 DIAGNOSIS — M25562 Pain in left knee: Secondary | ICD-10-CM | POA: Diagnosis not present

## 2023-12-16 DIAGNOSIS — Z96652 Presence of left artificial knee joint: Secondary | ICD-10-CM | POA: Diagnosis not present

## 2023-12-16 DIAGNOSIS — M1712 Unilateral primary osteoarthritis, left knee: Secondary | ICD-10-CM | POA: Diagnosis not present

## 2023-12-17 ENCOUNTER — Encounter (HOSPITAL_BASED_OUTPATIENT_CLINIC_OR_DEPARTMENT_OTHER): Payer: Self-pay | Admitting: Physical Therapy

## 2023-12-17 ENCOUNTER — Ambulatory Visit (HOSPITAL_BASED_OUTPATIENT_CLINIC_OR_DEPARTMENT_OTHER): Admitting: Physical Therapy

## 2023-12-17 DIAGNOSIS — R262 Difficulty in walking, not elsewhere classified: Secondary | ICD-10-CM

## 2023-12-17 DIAGNOSIS — M25662 Stiffness of left knee, not elsewhere classified: Secondary | ICD-10-CM | POA: Diagnosis not present

## 2023-12-17 DIAGNOSIS — Z471 Aftercare following joint replacement surgery: Secondary | ICD-10-CM | POA: Diagnosis not present

## 2023-12-17 DIAGNOSIS — M25562 Pain in left knee: Secondary | ICD-10-CM

## 2023-12-17 DIAGNOSIS — M6281 Muscle weakness (generalized): Secondary | ICD-10-CM | POA: Diagnosis not present

## 2023-12-17 DIAGNOSIS — Z96652 Presence of left artificial knee joint: Secondary | ICD-10-CM | POA: Diagnosis not present

## 2023-12-17 NOTE — Therapy (Unsigned)
 OUTPATIENT PHYSICAL THERAPY LOWER EXTREMITYTREATMENT   Patient Name: Laura Mills MRN: [id number] DOB:1960-08-13, 63 y.o., female Today's Date: 12/18/2023  END OF SESSION:  PT End of Session - 12/17/23 1458     Visit Number 3    Number of Visits 22    Date for PT Re-Evaluation 02/19/24    Authorization Type AETNA    PT Start Time 1453    PT Stop Time 1533    PT Time Calculation (min) 40 min    Activity Tolerance Patient tolerated treatment well    Behavior During Therapy [facility] for tasks assessed/performed           Past Medical History:  Diagnosis Date   Allergy    Arthritis    LUMBAR SPINE AND RIGHT HIP   Cataract    GERD (gastroesophageal reflux disease)    History of kidney stones 10/2012   Hyperlipidemia    Hypertension    Renal stone    Past Surgical History:  Procedure Laterality Date   ABDOMINAL HYSTERECTOMY  2000   CHOLECYSTECTOMY  04/09/2007   CYSTOSCOPY WITH URETEROSCOPY Right 10/02/2012   Procedure: CYSTOSCOPY WITH RIGHT RETROGRADE AND STENT PLACEMENT ;  Surgeon: Donnice Gwenyth Brooks, MD;  Location: [facility];  Service: Urology;  Laterality: Right;   CYSTOSCOPY WITH URETEROSCOPY Right 11/13/2012   Procedure: RIGHT URETEROSCOPY;  Surgeon: Donnice Gwenyth Brooks, MD;  Location: [facility];  Service: Urology;  Laterality: Right;  with Stent   CYSTOSCOPY/URETEROSCOPY/HOLMIUM LASER/STENT PLACEMENT Right 05/31/2022   Procedure: CYSTOSCOPY RIGHT URETEROSCOPY/HOLMIUM LASER/STENT PLACEMENT;  Surgeon: Brooks Donnice, MD;  Location: [facility];  Service: Urology;  Laterality: Right;  60 MINS   LITHOTRIPSY  10/06/2012   PUBOVAGINAL SLING N/A 04/15/2017   Procedure: CARLOYN GLADE;  Surgeon: Brooks Donnice, MD;  Location: [facility];  Service: Urology;  Laterality: N/A;  NEEDS 90 MIN FOR PROCEDURE   ROTATOR CUFF REPAIR Left    TYMPANOSTOMY TUBE PLACEMENT Right 2019   to clear up MRSA   There are no active problems to display for this patient.   REFERRING  PROVIDER: Beverley Evalene BIRCH, MD  REFERRING DIAG: L TKR  THERAPY DIAG:  Left knee pain, unspecified chronicity  Stiffness of left knee, not elsewhere classified  Muscle weakness (generalized)  Difficulty in walking, not elsewhere classified  Rationale for Evaluation and Treatment: Rehabilitation  ONSET DATE: DOS  12/04/2023  SUBJECTIVE:   SUBJECTIVE STATEMENT: Pt is 1 week and 6 days s/p L TKR.  Pt reports having soreness the following day after prior Rx and denies any adverse effects.  Pt didn't wake up last night to take her pain meds.  Pt states she has been performing her HEP without adverse effects.  Pt is using ice.  Pt reports getting in/out of the bed is much easier.  She is not using the strap to lift left with bed mobility.   She has some difficulty with heel slides in her posterior knee when she is wearing her compression hose.   Pt states her bruising is improving.  Pt has been wrapping her leg when taking shower and the bandage has been dry.  Pt has an appt with MD on Thursday.  Pt states she was able to don her shoe independently today.  Pt states she is also able to lift her leg without using the strap.      Pt is using the CPM, but not as much due to performing her home exercises.  She is using the bone foam pad  for extension.  Pt is also using ice and taking tramadol.     PERTINENT HISTORY: L TKR  12/04/2023 Severe arthritis R knee which pt receives injections, plan for R TKR in December Arthritis in lumbar and R hip also HTN  PAIN:  NPRS:  5-6/10 in knee, 6/10 at incision   PRECAUTIONS: per dx; R knee OA--planning for R knee TKR   WEIGHT BEARING RESTRICTIONS: FWB  FALLS:  Has patient fallen in last 6 months? Yes. Number of falls 2, R ankle rolled with new shoes ;  fell in the bathroom when the towel bar broke off the wall  LIVING ENVIRONMENT: Lives with: lives with their spouse Lives in: 1 story home Stairs: 3 steps to enter home with 1 rail Has  following equipment at home: FWW, crutches, SPC, 3 pronged cane  OCCUPATION: part time home care, primarily sitting  PLOF: Independent  PATIENT GOALS: to improve pain and mobility   OBJECTIVE:  Note: Objective measures were completed at Evaluation unless otherwise noted.  DIAGNOSTIC FINDINGS: Pt is post op.  PATIENT SURVEYS:  LEFS:  28/80  COGNITION: Overall cognitive status: Within functional limits for tasks assessed      OBSERVATION: Bruising in L thigh and distal LE including anterior and medial shin.  Aquacell bandage in place over incision.  Pt has 2 small spots of drainage at proximal bandage, but nothing abnormal.  Pt not wearing compression stockings though states she has been wearing them.     LOWER EXTREMITY ROM:   ROM Right eval Left eval Left 9/8  Hip flexion     Hip extension     Hip abduction     Hip adduction     Hip internal rotation     Hip external rotation     Knee flexion  69/88 AROM/PROM 97 deg  Knee extension  0 deg AROM 0 deg AROM  Ankle dorsiflexion     Ankle plantarflexion     Ankle inversion     Ankle eversion      (Blank rows = not tested)  LOWER EXTREMITY MMT:  Strength not tested due to recent surgery and post op considerations.    FUNCTIONAL TESTS:  Pt performs sit to/from stand transfers with L LE out in front.   GAIT: Assistive device utilized: Walker - 2 wheeled Comments: Pt ambulating well with FWW.  She is using increased support with Ue's on FWW to decrease Wb'ing on L LE as expected.  PT educated pt and husband in correct height of walker and adjusted walker to appropriate height.                                                                                                                                TREATMENT:   Reviewed compliance with HEP, pain level, response to prior Rx, and current function.   Gait Training:  Pt ambulated with a FWW in the hallway with cuing from PT  for heel to toe gait pattern including  heel strike and toe off.   Pt received L knee flexion PROM seated at edge of table per pt and tissue tolerance.   Pt performed: Seated heel slides 2x10 reps quad sets with a 5 sec hold x 10 reps glute sets with a 5 sec hold x 10 reps Supine SLR with PT assistance x10 reps Supine hip abduction  2x10 SAQ 2x10 supine heel slides 2x10 reps.       PATIENT EDUCATION:  Education details:  Instructed pt to not put a pillow under knee.  Instructed pt to wear compression stockings.  Educated pt in post op protocol restrictions and expectations.  HEP, objective findings, gait training, bandage dressing, dx, prognosis, and POC.  PT answered pt's questions.  Person educated: Patient Education method: Explanation, Demonstration, Tactile cues, Verbal cues, and Handouts Education comprehension: verbalized understanding, returned demonstration, verbal cues required, and tactile cues required  HOME EXERCISE PROGRAM: Access Code: [email or web address] URL: [email or web address] Date: 12/12/2023 Prepared by: Mose Minerva  Exercises - Supine Quadricep Sets  - 2 x daily - 7 x weekly - 2 sets - 10 reps - 5 seconds hold - Supine Gluteal Sets  - 2 x daily - 7 x weekly - 2 sets - 10 reps - 5 seconds hold - Supine Ankle Pumps  - 7 x weekly - Longsitting Heel Slide  - 2-3 x daily - 7 x weekly - 1 sets - 10 reps -Sitting with knee flexed for 3-5 mins comfortably at various times for 6-7 times/day  Updated HEP: - Supine Short Arc Quad  - 1 x daily - 7 x weekly - 2 sets - 10 reps    ASSESSMENT:  CLINICAL IMPRESSION: Patient has been using CPM at home and performing home exercises.  She is making excellent progress with knee ROM.  She continues to demonstrate 0 deg of knee extension and demonstrated much improved active knee flexion ROM.  PT reviewed HEP and pt demonstrates good performance of HEP.  Pt was unable to perform supine SLR.  Pt performed exercises per protocol well with instruction and cuing  for correct form.  PT also instructed her in heel to toe gait and correct step placement inside walker.  She responded well to treatment having no c/o's after treatment.  She will benefit from skilled PT per protocol to address impairments and improve overall function.    Gait pattern Cuing for quad set Tegaderm still in place over distal bandage.     OBJECTIVE IMPAIRMENTS: Abnormal gait, decreased activity tolerance, decreased endurance, decreased knowledge of use of DME, decreased mobility, difficulty walking, decreased ROM, decreased strength, hypomobility, increased edema, impaired flexibility, and pain.   ACTIVITY LIMITATIONS: bending, sitting, standing, squatting, stairs, transfers, bed mobility, dressing, and locomotion level  PARTICIPATION LIMITATIONS: meal prep, cleaning, laundry, driving, shopping, and community activity  PERSONAL FACTORS: 1 comorbidity: Arthritis including in R knee are also affecting patient's functional outcome.   REHAB POTENTIAL: Good  CLINICAL DECISION MAKING: Stable/uncomplicated  EVALUATION COMPLEXITY: Low   GOALS:   SHORT TERM GOALS:  Pt will be independent and compliant with HEP for improved pain, ROM, strength, and function.  Baseline: Goal status: INITIAL Target date:  01/01/24   2.  Pt will demo a good quad set and be able to perform a supine SLR independently for improved strength and stability with gait.  Baseline:  Goal status: INITIAL Target date: 12/25/2023  3.  Pt will demo improved L knee flexion AROM to  95 deg for improved gait, stiffness, and mobility.  Baseline:  Goal status: INITIAL Target date:  01/08/2024  4.  Pt will demo improved quality of gait and ambulate with a SPC with good stability.  Baseline:  Goal status: INITIAL Target date:  01/08/2024   5.  Pt will be able to perform her normal transfers with no > than minimal difficulty.   Baseline:  Goal status: INITIAL    LONG TERM GOALS: Target date:  02/19/2024  Pt will demo improved R knee AROM to 0 - 115 deg for improved mobility and stiffness and for improved performance of stairs.  Baseline:  Goal status: INITIAL  2.  Pt will ambulate with a normalized heel to toe gait without limping.  Baseline:  Goal status: INITIAL  3.   Pt will ambulate community distance without an AD without significant L knee pain and difficulty.  Baseline:  Goal status: INITIAL  4.   Pt will be able to perform household chores without significant L knee pain and difficulty.  Baseline:  Goal status: INITIAL  5.  Pt will demo improved strength to 4+/5 in R hip abd, 5/5 in Hip flexion, and 5/5 in knee ext and flexion for improved performance of daily functional mobility and to assist with returning to recreational activities.  Baseline:  Goal status: INITIAL  6.  Pt will be able to perform 4-5 steps with a reciprocal gait with the rail for improved performance of stairs.  Baseline:  Goal status: INITIAL   PLAN:  PT FREQUENCY: 2-3x/wk x 3 weeks and 2x/wk afterwards   PT DURATION: 10 weeks  PLANNED INTERVENTIONS: 97164- PT Re-evaluation, 97750- Physical Performance Testing, 97110-Therapeutic exercises, 97530- Therapeutic activity, 97112- Neuromuscular re-education, 97535- Self Care, 02859- Manual therapy, [phone]- Gait training, [phone]- Aquatic Therapy, [phone]- Electrical stimulation (unattended), Patient/Family education, Balance training, Stair training, Taping, Joint mobilization, Spinal mobilization, Scar mobilization, DME instructions, Cryotherapy, and Moist heat  PLAN FOR NEXT SESSION: Cont per TKR protocol.  Gait training.   Leigh Minerva III PT, DPT 12/18/23 4:43 PM

## 2023-12-19 ENCOUNTER — Encounter (HOSPITAL_BASED_OUTPATIENT_CLINIC_OR_DEPARTMENT_OTHER): Payer: Self-pay | Admitting: Physical Therapy

## 2023-12-19 ENCOUNTER — Ambulatory Visit (HOSPITAL_BASED_OUTPATIENT_CLINIC_OR_DEPARTMENT_OTHER): Admitting: Physical Therapy

## 2023-12-19 DIAGNOSIS — M6281 Muscle weakness (generalized): Secondary | ICD-10-CM

## 2023-12-19 DIAGNOSIS — M25662 Stiffness of left knee, not elsewhere classified: Secondary | ICD-10-CM | POA: Diagnosis not present

## 2023-12-19 DIAGNOSIS — R262 Difficulty in walking, not elsewhere classified: Secondary | ICD-10-CM | POA: Diagnosis not present

## 2023-12-19 DIAGNOSIS — Z471 Aftercare following joint replacement surgery: Secondary | ICD-10-CM | POA: Diagnosis not present

## 2023-12-19 DIAGNOSIS — M25562 Pain in left knee: Secondary | ICD-10-CM | POA: Diagnosis not present

## 2023-12-19 DIAGNOSIS — Z96652 Presence of left artificial knee joint: Secondary | ICD-10-CM | POA: Diagnosis not present

## 2023-12-19 NOTE — Therapy (Signed)
 OUTPATIENT PHYSICAL THERAPY LOWER EXTREMITYTREATMENT   Patient Name: Laura Mills MRN: [id number] DOB:20-Aug-1960, 63 y.o., female Today's Date: 12/19/2023  END OF SESSION:  PT End of Session - 12/19/23 1031     Visit Number 4    Number of Visits 22    Date for PT Re-Evaluation 02/19/24    Authorization Type AETNA    PT Start Time 1029    PT Stop Time 1112    PT Time Calculation (min) 43 min    Activity Tolerance Patient tolerated treatment well    Behavior During Therapy WFL for tasks assessed/performed           Past Medical History:  Diagnosis Date   Allergy    Arthritis    LUMBAR SPINE AND RIGHT HIP   Cataract    GERD (gastroesophageal reflux disease)    History of kidney stones 10/2012   Hyperlipidemia    Hypertension    Renal stone    Past Surgical History:  Procedure Laterality Date   ABDOMINAL HYSTERECTOMY  2000   CHOLECYSTECTOMY  04/09/2007   CYSTOSCOPY WITH URETEROSCOPY Right 10/02/2012   Procedure: CYSTOSCOPY WITH RIGHT RETROGRADE AND STENT PLACEMENT ;  Surgeon: Donnice Gwenyth Brooks, MD;  Location: [facility];  Service: Urology;  Laterality: Right;   CYSTOSCOPY WITH URETEROSCOPY Right 11/13/2012   Procedure: RIGHT URETEROSCOPY;  Surgeon: Donnice Gwenyth Brooks, MD;  Location: [facility];  Service: Urology;  Laterality: Right;  with Stent   CYSTOSCOPY/URETEROSCOPY/HOLMIUM LASER/STENT PLACEMENT Right 05/31/2022   Procedure: CYSTOSCOPY RIGHT URETEROSCOPY/HOLMIUM LASER/STENT PLACEMENT;  Surgeon: Brooks Donnice, MD;  Location: [facility];  Service: Urology;  Laterality: Right;  60 MINS   LITHOTRIPSY  10/06/2012   PUBOVAGINAL SLING N/A 04/15/2017   Procedure: CARLOYN GLADE;  Surgeon: Brooks Donnice, MD;  Location: [facility];  Service: Urology;  Laterality: N/A;  NEEDS 90 MIN FOR PROCEDURE   ROTATOR CUFF REPAIR Left    TYMPANOSTOMY TUBE PLACEMENT Right 2019   to clear up MRSA   There are no active problems to display for this patient.   REFERRING  PROVIDER: Beverley Evalene BIRCH, MD  REFERRING DIAG: L TKR  THERAPY DIAG:  Left knee pain, unspecified chronicity  Stiffness of left knee, not elsewhere classified  Muscle weakness (generalized)  Difficulty in walking, not elsewhere classified  Rationale for Evaluation and Treatment: Rehabilitation  ONSET DATE: DOS  12/04/2023  SUBJECTIVE:   SUBJECTIVE STATEMENT: Pt is 2 weeks and 1 day s/p L TKR.  Pt is not taking much medication.  Pt saw MD and he was pleased with progress.  Pt had x rays and MD informed her they looked good.  He removed the aquacel bandage.    Pt reports having soreness after prior Rx and denies any adverse effects.  Pt has been performing HEP.     PERTINENT HISTORY: L TKR  12/04/2023 Severe arthritis R knee which pt receives injections, plan for R TKR in December Arthritis in lumbar and R hip also HTN  PAIN:  NPRS:  2-3/10 in knee, 6/10 at incision   PRECAUTIONS: per dx; R knee OA--planning for R knee TKR   WEIGHT BEARING RESTRICTIONS: FWB  FALLS:  Has patient fallen in last 6 months? Yes. Number of falls 2, R ankle rolled with new shoes ;  fell in the bathroom when the towel bar broke off the wall  LIVING ENVIRONMENT: Lives with: lives with their spouse Lives in: 1 story home Stairs: 3 steps to enter home with 1 rail Has following  equipment at home: FWW, crutches, SPC, 3 pronged cane  OCCUPATION: part time home care, primarily sitting  PLOF: Independent  PATIENT GOALS: to improve pain and mobility   OBJECTIVE:  Note: Objective measures were completed at Evaluation unless otherwise noted.  DIAGNOSTIC FINDINGS: Pt is post op.    LOWER EXTREMITY ROM:   ROM Right eval Left eval Left 9/8 Left 9/12  Hip flexion      Hip extension      Hip abduction      Hip adduction      Hip internal rotation      Hip external rotation      Knee flexion  69/88 AROM/PROM 97 deg 107 deg  AAROM  Knee extension  0 deg AROM 0 deg AROM   Ankle  dorsiflexion      Ankle plantarflexion      Ankle inversion      Ankle eversion       (Blank rows = not tested)                                                                                                                                TREATMENT:   Reviewed compliance with HEP, pain level, response to prior Rx, and current function.   Incision intact and dry.  Pt has no signs of infection.  Pt had no stitches.    Pt received L knee flexion and extension PROM in supine per pt and tissue tolerance.   Pt performed: Supine heel slide with strap 2x 10 reps Supine SLR with PT assistance 2x10 reps Supine hip abduction  2x10 SAQ 2x10 supine heel slides 2x10 reps.   Longsitting gastroc stretch with strap 3x20 sec Standing heel raises 2x10    PATIENT EDUCATION:  Education details:  Instructed pt to not put a pillow under knee.  Instructed pt to wear compression stockings.  Educated pt in post op protocol restrictions and expectations.  HEP, gait training, bandage dressing, dx, prognosis, and POC.  PT answered pt's questions.  Person educated: Patient Education method: Explanation, Demonstration, Tactile cues, Verbal cues, and Handouts Education comprehension: verbalized understanding, returned demonstration, verbal cues required, and tactile cues required  HOME EXERCISE PROGRAM: Access Code: [email or web address] URL: [email or web address] Date: 12/12/2023 Prepared by: Mose Minerva  Exercises - Supine Quadricep Sets  - 2 x daily - 7 x weekly - 2 sets - 10 reps - 5 seconds hold - Supine Gluteal Sets  - 2 x daily - 7 x weekly - 2 sets - 10 reps - 5 seconds hold - Supine Ankle Pumps  - 7 x weekly - Longsitting Heel Slide  - 2-3 x daily - 7 x weekly - 1 sets - 10 reps -Sitting with knee flexed for 3-5 mins comfortably at various times for 6-7 times/day  Updated HEP: - Supine Short Arc Quad  - 1 x daily - 7 x weekly - 2 sets - 10 reps  Updated HEP: -  Supine Heel Slide with  Strap  - 2 x daily - 7 x weekly - 1-2 sets - 10 reps    ASSESSMENT:  CLINICAL IMPRESSION: Pt is making excellent progress with knee ROM.  She demonstrates improved knee ROM as evidenced by goniometric measurements.  PT progressed exercises per protocol and she tolerated exercises well.  Pt's aquacel bandage has been removed and her incision is intact without any signs of infection.  Pt ambulates with a FWW.  She continues to have quad weakness being unable to perform supine SLR.  She requires assistance from PT to perform.  PT educated pt in performing at home with her husband's help.  Pt is compliant with HEP and PT updated HEP with knee flexion AAROM.  Pt demonstrates good understanding of HEP.  She tolerated treatment well and had no c/o's after treatment.  She will benefit from continued skilled PT per protocol to address impairments and improve overall function.       OBJECTIVE IMPAIRMENTS: Abnormal gait, decreased activity tolerance, decreased endurance, decreased knowledge of use of DME, decreased mobility, difficulty walking, decreased ROM, decreased strength, hypomobility, increased edema, impaired flexibility, and pain.   ACTIVITY LIMITATIONS: bending, sitting, standing, squatting, stairs, transfers, bed mobility, dressing, and locomotion level  PARTICIPATION LIMITATIONS: meal prep, cleaning, laundry, driving, shopping, and community activity  PERSONAL FACTORS: 1 comorbidity: Arthritis including in R knee are also affecting patient's functional outcome.   REHAB POTENTIAL: Good  CLINICAL DECISION MAKING: Stable/uncomplicated  EVALUATION COMPLEXITY: Low   GOALS:   SHORT TERM GOALS:  Pt will be independent and compliant with HEP for improved pain, ROM, strength, and function.  Baseline: Goal status: INITIAL Target date:  01/01/24   2.  Pt will demo a good quad set and be able to perform a supine SLR independently for improved strength and stability with gait.  Baseline:   Goal status: INITIAL Target date: 12/25/2023  3.  Pt will demo improved L knee flexion AROM to 95 deg for improved gait, stiffness, and mobility.  Baseline:  Goal status: INITIAL Target date:  01/08/2024  4.  Pt will demo improved quality of gait and ambulate with a SPC with good stability.  Baseline:  Goal status: INITIAL Target date:  01/08/2024   5.  Pt will be able to perform her normal transfers with no > than minimal difficulty.   Baseline:  Goal status: INITIAL    LONG TERM GOALS: Target date: 02/19/2024  Pt will demo improved R knee AROM to 0 - 115 deg for improved mobility and stiffness and for improved performance of stairs.  Baseline:  Goal status: INITIAL  2.  Pt will ambulate with a normalized heel to toe gait without limping.  Baseline:  Goal status: INITIAL  3.   Pt will ambulate community distance without an AD without significant L knee pain and difficulty.  Baseline:  Goal status: INITIAL  4.   Pt will be able to perform household chores without significant L knee pain and difficulty.  Baseline:  Goal status: INITIAL  5.  Pt will demo improved strength to 4+/5 in R hip abd, 5/5 in Hip flexion, and 5/5 in knee ext and flexion for improved performance of daily functional mobility and to assist with returning to recreational activities.  Baseline:  Goal status: INITIAL  6.  Pt will be able to perform 4-5 steps with a reciprocal gait with the rail for improved performance of stairs.  Baseline:  Goal status: INITIAL   PLAN:  PT FREQUENCY: 2-3x/wk x 3 weeks and 2x/wk afterwards   PT DURATION: 10 weeks  PLANNED INTERVENTIONS: 97164- PT Re-evaluation, 97750- Physical Performance Testing, 97110-Therapeutic exercises, 97530- Therapeutic activity, 97112- Neuromuscular re-education, 97535- Self Care, 02859- Manual therapy, [phone]- Gait training, [phone]- Aquatic Therapy, [phone]- Electrical stimulation (unattended), Patient/Family education, Balance training,  Stair training, Taping, Joint mobilization, Spinal mobilization, Scar mobilization, DME instructions, Cryotherapy, and Moist heat  PLAN FOR NEXT SESSION: Cont per TKR protocol.  Gait training.   Leigh Minerva III PT, DPT 12/19/23 7:49 PM

## 2023-12-22 ENCOUNTER — Encounter (HOSPITAL_BASED_OUTPATIENT_CLINIC_OR_DEPARTMENT_OTHER): Payer: Self-pay | Admitting: Physical Therapy

## 2023-12-22 ENCOUNTER — Ambulatory Visit (HOSPITAL_BASED_OUTPATIENT_CLINIC_OR_DEPARTMENT_OTHER): Admitting: Physical Therapy

## 2023-12-22 DIAGNOSIS — M25562 Pain in left knee: Secondary | ICD-10-CM | POA: Diagnosis not present

## 2023-12-22 DIAGNOSIS — M6281 Muscle weakness (generalized): Secondary | ICD-10-CM

## 2023-12-22 DIAGNOSIS — M25662 Stiffness of left knee, not elsewhere classified: Secondary | ICD-10-CM | POA: Diagnosis not present

## 2023-12-22 DIAGNOSIS — Z96652 Presence of left artificial knee joint: Secondary | ICD-10-CM | POA: Diagnosis not present

## 2023-12-22 DIAGNOSIS — R262 Difficulty in walking, not elsewhere classified: Secondary | ICD-10-CM | POA: Diagnosis not present

## 2023-12-22 DIAGNOSIS — Z471 Aftercare following joint replacement surgery: Secondary | ICD-10-CM | POA: Diagnosis not present

## 2023-12-22 NOTE — Therapy (Signed)
 OUTPATIENT PHYSICAL THERAPY LOWER EXTREMITYTREATMENT   Patient Name: Laura Mills MRN: [id number] DOB:1960/09/09, 63 y.o., female Today's Date: 12/23/2023  END OF SESSION:  PT End of Session - 12/22/23 1455     Visit Number 5    Number of Visits 22    Date for PT Re-Evaluation 02/19/24    Authorization Type AETNA    PT Start Time 1452    PT Stop Time 1542    PT Time Calculation (min) 50 min    Activity Tolerance Patient tolerated treatment well    Behavior During Therapy WFL for tasks assessed/performed            Past Medical History:  Diagnosis Date   Allergy    Arthritis    LUMBAR SPINE AND RIGHT HIP   Cataract    GERD (gastroesophageal reflux disease)    History of kidney stones 10/2012   Hyperlipidemia    Hypertension    Renal stone    Past Surgical History:  Procedure Laterality Date   ABDOMINAL HYSTERECTOMY  2000   CHOLECYSTECTOMY  04/09/2007   CYSTOSCOPY WITH URETEROSCOPY Right 10/02/2012   Procedure: CYSTOSCOPY WITH RIGHT RETROGRADE AND STENT PLACEMENT ;  Surgeon: Donnice Gwenyth Brooks, MD;  Location: [facility];  Service: Urology;  Laterality: Right;   CYSTOSCOPY WITH URETEROSCOPY Right 11/13/2012   Procedure: RIGHT URETEROSCOPY;  Surgeon: Donnice Gwenyth Brooks, MD;  Location: [facility];  Service: Urology;  Laterality: Right;  with Stent   CYSTOSCOPY/URETEROSCOPY/HOLMIUM LASER/STENT PLACEMENT Right 05/31/2022   Procedure: CYSTOSCOPY RIGHT URETEROSCOPY/HOLMIUM LASER/STENT PLACEMENT;  Surgeon: Brooks Donnice, MD;  Location: [facility];  Service: Urology;  Laterality: Right;  60 MINS   LITHOTRIPSY  10/06/2012   PUBOVAGINAL SLING N/A 04/15/2017   Procedure: CARLOYN GLADE;  Surgeon: Brooks Donnice, MD;  Location: [facility];  Service: Urology;  Laterality: N/A;  NEEDS 90 MIN FOR PROCEDURE   ROTATOR CUFF REPAIR Left    TYMPANOSTOMY TUBE PLACEMENT Right 2019   to clear up MRSA   There are no active problems to display for this patient.   REFERRING  PROVIDER: Beverley Evalene BIRCH, MD  REFERRING DIAG: L TKR  THERAPY DIAG:  Left knee pain, unspecified chronicity  Stiffness of left knee, not elsewhere classified  Muscle weakness (generalized)  Difficulty in walking, not elsewhere classified  Rationale for Evaluation and Treatment: Rehabilitation  ONSET DATE: DOS  12/04/2023  SUBJECTIVE:   SUBJECTIVE STATEMENT: Pt is 2 weeks and 4 days s/p L TKR.  Pt states she had soreness after prior Rx though no increased pain.  Pt states she notices soreness in the evening after PT treatments.   Pt states It feels like something is moving inside my knee.  Pt has been performing HEP.  Pt performed supine SLR at home with husband's assistance.  Pt states it felt stiff this AM, but it feels better now.      PERTINENT HISTORY: L TKR  12/04/2023 Severe arthritis R knee which pt receives injections, plan for R TKR in December Arthritis in lumbar and R hip also HTN  PAIN:  NPRS:  6/10 at incision and medial and lateral L knee.  Pt states it's more stiffness    PRECAUTIONS: per dx; R knee OA--planning for R knee TKR   WEIGHT BEARING RESTRICTIONS: FWB  FALLS:  Has patient fallen in last 6 months? Yes. Number of falls 2, R ankle rolled with new shoes ;  fell in the bathroom when the towel bar broke off the wall  LIVING  ENVIRONMENT: Lives with: lives with their spouse Lives in: 1 story home Stairs: 3 steps to enter home with 1 rail Has following equipment at home: FWW, crutches, SPC, 3 pronged cane  OCCUPATION: part time home care, primarily sitting  PLOF: Independent  PATIENT GOALS: to improve pain and mobility   OBJECTIVE:  Note: Objective measures were completed at Evaluation unless otherwise noted.  DIAGNOSTIC FINDINGS: Pt is post op.    LOWER EXTREMITY ROM:   ROM Right eval Left eval Left 9/8 Left 9/12  Hip flexion      Hip extension      Hip abduction      Hip adduction      Hip internal rotation      Hip  external rotation      Knee flexion  69/88 AROM/PROM 97 deg 107 deg  AAROM  Knee extension  0 deg AROM 0 deg AROM   Ankle dorsiflexion      Ankle plantarflexion      Ankle inversion      Ankle eversion       (Blank rows = not tested)                                                                                                                                TREATMENT:   Reviewed compliance with HEP, pain level, response to prior Rx, and current function.   Incision intact and dry.  Pt has no signs of infection.  Pt had no stitches.    Pt performed: Scifit bike x 4 mins rocking back and forth Standing heel raises 2x10 1/4 squats x10 LAQ 2x10 Supine SLR with PT assistance 2x10 reps S/L hip abduction 2x10 Supine heel slide with strap 2x 10 reps Longsitting gastroc stretch with strap 3x20 sec  Pt received L knee flexion and extension PROM in supine per pt and tissue tolerance.  PT updated HEP and gave pt a HEP handout.        PATIENT EDUCATION:  Education details:  Instructed pt to not put a pillow under knee.  Instructed pt to wear compression stockings.  Educated pt in post op protocol restrictions and expectations.  HEP, gait training, bandage dressing, dx, prognosis, and POC.  PT answered pt's questions.  Person educated: Patient Education method: Explanation, Demonstration, Tactile cues, Verbal cues, and Handouts Education comprehension: verbalized understanding, returned demonstration, verbal cues required, and tactile cues required  HOME EXERCISE PROGRAM: Access Code: [email or web address] URL: [email or web address] Date: 12/12/2023 Prepared by: Mose Minerva  Exercises - Supine Quadricep Sets  - 2 x daily - 7 x weekly - 2 sets - 10 reps - 5 seconds hold - Supine Gluteal Sets  - 2 x daily - 7 x weekly - 2 sets - 10 reps - 5 seconds hold - Supine Ankle Pumps  - 7 x weekly - Longsitting Heel Slide  - 2-3 x daily - 7 x weekly - 1 sets - 10 reps -  Sitting with knee  flexed for 3-5 mins comfortably at various times for 6-7 times/day - Supine Short Arc Quad  - 1 x daily - 7 x weekly - 2 sets - 10 reps - Supine Heel Slide with Strap  - 2 x daily - 7 x weekly - 1-2 sets - 10 reps  Updated HEP: - Long Sitting Calf Stretch with Strap  - 2 x daily - 7 x weekly - 2-3 reps - 20 seconds hold - Seated Long Arc Quad  - 1 x daily - 7 x weekly - 2 sets - 10 reps - Heel Raises with Counter Support  - 1 x daily - 7 x weekly - 2 sets - 10 reps    ASSESSMENT:  CLINICAL IMPRESSION: PT progressed exercises per protocol and she tolerated progression well.  She continues to make great progress with knee ROM.  Pt continues to have quad weakness and is unable to perform a supine SLR without assistance.  Pt is compliant with HEP.  PT updated HEP and gave pt a HEP handout.  Pt demonstrates good understanding of HEP.  She responded well to treatment well having no c/o's after treatment.  She will benefit from continued skilled PT per protocol to address impairments and improve overall function.       OBJECTIVE IMPAIRMENTS: Abnormal gait, decreased activity tolerance, decreased endurance, decreased knowledge of use of DME, decreased mobility, difficulty walking, decreased ROM, decreased strength, hypomobility, increased edema, impaired flexibility, and pain.   ACTIVITY LIMITATIONS: bending, sitting, standing, squatting, stairs, transfers, bed mobility, dressing, and locomotion level  PARTICIPATION LIMITATIONS: meal prep, cleaning, laundry, driving, shopping, and community activity  PERSONAL FACTORS: 1 comorbidity: Arthritis including in R knee are also affecting patient's functional outcome.   REHAB POTENTIAL: Good  CLINICAL DECISION MAKING: Stable/uncomplicated  EVALUATION COMPLEXITY: Low   GOALS:   SHORT TERM GOALS:  Pt will be independent and compliant with HEP for improved pain, ROM, strength, and function.  Baseline: Goal status: INITIAL Target date:   01/01/24   2.  Pt will demo a good quad set and be able to perform a supine SLR independently for improved strength and stability with gait.  Baseline:  Goal status: INITIAL Target date: 12/25/2023  3.  Pt will demo improved L knee flexion AROM to 95 deg for improved gait, stiffness, and mobility.  Baseline:  Goal status: INITIAL Target date:  01/08/2024  4.  Pt will demo improved quality of gait and ambulate with a SPC with good stability.  Baseline:  Goal status: INITIAL Target date:  01/08/2024   5.  Pt will be able to perform her normal transfers with no > than minimal difficulty.   Baseline:  Goal status: INITIAL    LONG TERM GOALS: Target date: 02/19/2024  Pt will demo improved R knee AROM to 0 - 115 deg for improved mobility and stiffness and for improved performance of stairs.  Baseline:  Goal status: INITIAL  2.  Pt will ambulate with a normalized heel to toe gait without limping.  Baseline:  Goal status: INITIAL  3.   Pt will ambulate community distance without an AD without significant L knee pain and difficulty.  Baseline:  Goal status: INITIAL  4.   Pt will be able to perform household chores without significant L knee pain and difficulty.  Baseline:  Goal status: INITIAL  5.  Pt will demo improved strength to 4+/5 in R hip abd, 5/5 in Hip flexion, and 5/5 in knee ext  and flexion for improved performance of daily functional mobility and to assist with returning to recreational activities.  Baseline:  Goal status: INITIAL  6.  Pt will be able to perform 4-5 steps with a reciprocal gait with the rail for improved performance of stairs.  Baseline:  Goal status: INITIAL   PLAN:  PT FREQUENCY: 2-3x/wk x 3 weeks and 2x/wk afterwards   PT DURATION: 10 weeks  PLANNED INTERVENTIONS: 97164- PT Re-evaluation, 97750- Physical Performance Testing, 97110-Therapeutic exercises, 97530- Therapeutic activity, 97112- Neuromuscular re-education, 97535- Self Care, [id number]-  Manual therapy, [phone]- Gait training, [phone]- Aquatic Therapy, [phone]- Electrical stimulation (unattended), Patient/Family education, Balance training, Stair training, Taping, Joint mobilization, Spinal mobilization, Scar mobilization, DME instructions, Cryotherapy, and Moist heat  PLAN FOR NEXT SESSION: Cont per TKR protocol.  Gait training.   Leigh Minerva III PT, DPT 12/23/23 10:25 PM

## 2023-12-24 ENCOUNTER — Ambulatory Visit (HOSPITAL_BASED_OUTPATIENT_CLINIC_OR_DEPARTMENT_OTHER): Admitting: Physical Therapy

## 2023-12-24 DIAGNOSIS — M6281 Muscle weakness (generalized): Secondary | ICD-10-CM

## 2023-12-24 DIAGNOSIS — Z96652 Presence of left artificial knee joint: Secondary | ICD-10-CM | POA: Diagnosis not present

## 2023-12-24 DIAGNOSIS — R262 Difficulty in walking, not elsewhere classified: Secondary | ICD-10-CM | POA: Diagnosis not present

## 2023-12-24 DIAGNOSIS — M25562 Pain in left knee: Secondary | ICD-10-CM

## 2023-12-24 DIAGNOSIS — M25662 Stiffness of left knee, not elsewhere classified: Secondary | ICD-10-CM | POA: Diagnosis not present

## 2023-12-24 DIAGNOSIS — Z471 Aftercare following joint replacement surgery: Secondary | ICD-10-CM | POA: Diagnosis not present

## 2023-12-24 NOTE — Therapy (Signed)
 OUTPATIENT PHYSICAL THERAPY LOWER EXTREMITYTREATMENT   Patient Name: Laura Mills MRN: [id number] DOB:[birth year], 63 y.o., female Today's Date: 12/25/2023  END OF SESSION:  PT End of Session - 12/24/23 1547     Visit Number 6    Number of Visits 22    Date for Recertification  02/19/24    Authorization Type AETNA    PT Start Time 1457    PT Stop Time 1539    PT Time Calculation (min) 42 min    Activity Tolerance Patient tolerated treatment well    Behavior During Therapy WFL for tasks assessed/performed             Past Medical History:  Diagnosis Date   Allergy    Arthritis    LUMBAR SPINE AND RIGHT HIP   Cataract    GERD (gastroesophageal reflux disease)    History of kidney stones 10/2012   Hyperlipidemia    Hypertension    Renal stone    Past Surgical History:  Procedure Laterality Date   ABDOMINAL HYSTERECTOMY  2000   CHOLECYSTECTOMY  04/09/2007   CYSTOSCOPY WITH URETEROSCOPY Right 10/02/2012   Procedure: CYSTOSCOPY WITH RIGHT RETROGRADE AND STENT PLACEMENT ;  Surgeon: Donnice Gwenyth Brooks, MD;  Location: [facility];  Service: Urology;  Laterality: Right;   CYSTOSCOPY WITH URETEROSCOPY Right 11/13/2012   Procedure: RIGHT URETEROSCOPY;  Surgeon: Donnice Gwenyth Brooks, MD;  Location: [facility];  Service: Urology;  Laterality: Right;  with Stent   CYSTOSCOPY/URETEROSCOPY/HOLMIUM LASER/STENT PLACEMENT Right 05/31/2022   Procedure: CYSTOSCOPY RIGHT URETEROSCOPY/HOLMIUM LASER/STENT PLACEMENT;  Surgeon: Brooks Donnice, MD;  Location: [facility];  Service: Urology;  Laterality: Right;  60 MINS   LITHOTRIPSY  10/06/2012   PUBOVAGINAL SLING N/A 04/15/2017   Procedure: CARLOYN GLADE;  Surgeon: Brooks Donnice, MD;  Location: [facility];  Service: Urology;  Laterality: N/A;  NEEDS 90 MIN FOR PROCEDURE   ROTATOR CUFF REPAIR Left    TYMPANOSTOMY TUBE PLACEMENT Right 2019   to clear up MRSA   There are no active problems to display for this patient.   REFERRING  PROVIDER: Beverley Evalene BIRCH, MD  REFERRING DIAG: L TKR  THERAPY DIAG:  Left knee pain, unspecified chronicity  Stiffness of left knee, not elsewhere classified  Muscle weakness (generalized)  Difficulty in walking, not elsewhere classified  Rationale for Evaluation and Treatment: Rehabilitation  ONSET DATE: DOS  12/04/2023  SUBJECTIVE:   SUBJECTIVE STATEMENT: Pt is 2 weeks and 6 days s/p L TKR.  Pt denies any adverse effects after prior Rx.   Pt states the incision feels better, but she feels it around the incision.  Pt reports her knee is stiff.  Pt is compliant with HEP.  She reports no issues with her home exercises including her new HEP.    PERTINENT HISTORY: L TKR  12/04/2023 Severe arthritis R knee which pt receives injections, plan for R TKR in December Arthritis in lumbar and R hip also HTN  PAIN:  NPRS:  6/10 medial and lateral L knee.  Pt states it's more stiffness    PRECAUTIONS: per dx; R knee OA--planning for R knee TKR   WEIGHT BEARING RESTRICTIONS: FWB  FALLS:  Has patient fallen in last 6 months? Yes. Number of falls 2, R ankle rolled with new shoes ;  fell in the bathroom when the towel bar broke off the wall  LIVING ENVIRONMENT: Lives with: lives with their spouse Lives in: 1 story home Stairs: 3 steps to enter home with 1 rail  Has following equipment at home: FWW, crutches, SPC, 3 pronged cane  OCCUPATION: part time home care, primarily sitting  PLOF: Independent  PATIENT GOALS: to improve pain and mobility   OBJECTIVE:  Note: Objective measures were completed at Evaluation unless otherwise noted.  DIAGNOSTIC FINDINGS: Pt is post op.    LOWER EXTREMITY ROM:   ROM Right eval Left eval Left 9/8 Left 9/12  Hip flexion      Hip extension      Hip abduction      Hip adduction      Hip internal rotation      Hip external rotation      Knee flexion  69/88 AROM/PROM 97 deg 107 deg  AAROM  Knee extension  0 deg AROM 0 deg AROM    Ankle dorsiflexion      Ankle plantarflexion      Ankle inversion      Ankle eversion       (Blank rows = not tested)                                                                                                                                TREATMENT:   Reviewed compliance with HEP, pain level, and response to prior Rx.   Pt performed: Scifit bike x 5 mins rocking back and forth SAQ 2x10 LAQ 2x10 Supine SLR with and without assistance x 8, 10 S/L hip abduction x10 Standing heel raises 2x10 TKE RTB 2x10 1/4 squats 2x10 with UE support on rail Supine heel slide with strap 2x10 reps  Pt received L knee flexion and extension PROM in supine per pt and tissue tolerance.        PATIENT EDUCATION:  Education details:  Instructed pt to not put a pillow under knee.  Instructed pt to wear compression stockings.  Educated pt in post op protocol restrictions and expectations.  HEP, gait training, bandage dressing, dx, prognosis, and POC.  PT answered pt's questions.  Person educated: Patient Education method: Explanation, Demonstration, Tactile cues, Verbal cues, and Handouts Education comprehension: verbalized understanding, returned demonstration, verbal cues required, and tactile cues required  HOME EXERCISE PROGRAM: Access Code: [email or web address] URL: [email or web address] Date: 12/12/2023 Prepared by: Mose Minerva  Exercises - Supine Quadricep Sets  - 2 x daily - 7 x weekly - 2 sets - 10 reps - 5 seconds hold - Supine Gluteal Sets  - 2 x daily - 7 x weekly - 2 sets - 10 reps - 5 seconds hold - Supine Ankle Pumps  - 7 x weekly - Longsitting Heel Slide  - 2-3 x daily - 7 x weekly - 1 sets - 10 reps -Sitting with knee flexed for 3-5 mins comfortably at various times for 6-7 times/day - Supine Short Arc Quad  - 1 x daily - 7 x weekly - 2 sets - 10 reps - Supine Heel Slide with Strap  - 2 x daily - 7 x  weekly - 1-2 sets - 10 reps - Long Sitting Calf Stretch with Strap  -  2 x daily - 7 x weekly - 2-3 reps - 20 seconds hold - Seated Long Arc Quad  - 1 x daily - 7 x weekly - 2 sets - 10 reps - Heel Raises with Counter Support  - 1 x daily - 7 x weekly - 2 sets - 10 reps    ASSESSMENT:  CLINICAL IMPRESSION: Pt is progressing with protocol.  Pt is improving with quad strength as evidenced by performance of supine SLR.  She was able to perform supine SLR at times without assistance.  Attempted seated HS curls with YTB though stopped due to pain.  She continues to make great progress with knee ROM and tolerated knee PROM well.  Pt is compliant with HEP.  She responded well to treatment well having no c/o's after treatment.  She will benefit from continued skilled PT per protocol to improve pain, ROM, strength, gait, and functional mobility.         OBJECTIVE IMPAIRMENTS: Abnormal gait, decreased activity tolerance, decreased endurance, decreased knowledge of use of DME, decreased mobility, difficulty walking, decreased ROM, decreased strength, hypomobility, increased edema, impaired flexibility, and pain.   ACTIVITY LIMITATIONS: bending, sitting, standing, squatting, stairs, transfers, bed mobility, dressing, and locomotion level  PARTICIPATION LIMITATIONS: meal prep, cleaning, laundry, driving, shopping, and community activity  PERSONAL FACTORS: 1 comorbidity: Arthritis including in R knee are also affecting patient's functional outcome.   REHAB POTENTIAL: Good  CLINICAL DECISION MAKING: Stable/uncomplicated  EVALUATION COMPLEXITY: Low   GOALS:   SHORT TERM GOALS:  Pt will be independent and compliant with HEP for improved pain, ROM, strength, and function.  Baseline: Goal status: INITIAL Target date:  01/01/24   2.  Pt will demo a good quad set and be able to perform a supine SLR independently for improved strength and stability with gait.  Baseline:  Goal status: INITIAL Target date: 12/25/2023  3.  Pt will demo improved L knee flexion AROM to  95 deg for improved gait, stiffness, and mobility.  Baseline:  Goal status: INITIAL Target date:  01/08/2024  4.  Pt will demo improved quality of gait and ambulate with a SPC with good stability.  Baseline:  Goal status: INITIAL Target date:  01/08/2024   5.  Pt will be able to perform her normal transfers with no > than minimal difficulty.   Baseline:  Goal status: INITIAL    LONG TERM GOALS: Target date: 02/19/2024  Pt will demo improved R knee AROM to 0 - 115 deg for improved mobility and stiffness and for improved performance of stairs.  Baseline:  Goal status: INITIAL  2.  Pt will ambulate with a normalized heel to toe gait without limping.  Baseline:  Goal status: INITIAL  3.   Pt will ambulate community distance without an AD without significant L knee pain and difficulty.  Baseline:  Goal status: INITIAL  4.   Pt will be able to perform household chores without significant L knee pain and difficulty.  Baseline:  Goal status: INITIAL  5.  Pt will demo improved strength to 4+/5 in R hip abd, 5/5 in Hip flexion, and 5/5 in knee ext and flexion for improved performance of daily functional mobility and to assist with returning to recreational activities.  Baseline:  Goal status: INITIAL  6.  Pt will be able to perform 4-5 steps with a reciprocal gait with the rail for improved  performance of stairs.  Baseline:  Goal status: INITIAL   PLAN:  PT FREQUENCY: 2-3x/wk x 3 weeks and 2x/wk afterwards   PT DURATION: 10 weeks  PLANNED INTERVENTIONS: 97164- PT Re-evaluation, 97750- Physical Performance Testing, 97110-Therapeutic exercises, 97530- Therapeutic activity, 97112- Neuromuscular re-education, 97535- Self Care, 02859- Manual therapy, [phone]- Gait training, [phone]- Aquatic Therapy, [phone]- Electrical stimulation (unattended), Patient/Family education, Balance training, Stair training, Taping, Joint mobilization, Spinal mobilization, Scar mobilization, DME instructions,  Cryotherapy, and Moist heat  PLAN FOR NEXT SESSION: Cont per TKR protocol.  Gait training.   Leigh Minerva III PT, DPT 12/25/23 5:13 PM

## 2023-12-25 ENCOUNTER — Encounter (HOSPITAL_BASED_OUTPATIENT_CLINIC_OR_DEPARTMENT_OTHER): Payer: Self-pay | Admitting: Physical Therapy

## 2023-12-26 ENCOUNTER — Encounter (HOSPITAL_BASED_OUTPATIENT_CLINIC_OR_DEPARTMENT_OTHER): Payer: Self-pay | Admitting: Physical Therapy

## 2023-12-26 ENCOUNTER — Ambulatory Visit (HOSPITAL_BASED_OUTPATIENT_CLINIC_OR_DEPARTMENT_OTHER): Admitting: Physical Therapy

## 2023-12-26 DIAGNOSIS — M25662 Stiffness of left knee, not elsewhere classified: Secondary | ICD-10-CM

## 2023-12-26 DIAGNOSIS — Z471 Aftercare following joint replacement surgery: Secondary | ICD-10-CM | POA: Diagnosis not present

## 2023-12-26 DIAGNOSIS — M25562 Pain in left knee: Secondary | ICD-10-CM | POA: Diagnosis not present

## 2023-12-26 DIAGNOSIS — R262 Difficulty in walking, not elsewhere classified: Secondary | ICD-10-CM | POA: Diagnosis not present

## 2023-12-26 DIAGNOSIS — M6281 Muscle weakness (generalized): Secondary | ICD-10-CM

## 2023-12-26 DIAGNOSIS — Z96652 Presence of left artificial knee joint: Secondary | ICD-10-CM | POA: Diagnosis not present

## 2023-12-26 NOTE — Therapy (Signed)
 OUTPATIENT PHYSICAL THERAPY LOWER EXTREMITYTREATMENT   Patient Name: Laura Mills MRN: [id number] DOB:[birth year], 63 y.o., female Today's Date: 12/26/2023  END OF SESSION:  PT End of Session - 12/26/23 1220     Visit Number 7    Number of Visits 22    Date for Recertification  02/19/24    Authorization Type AET[employer]    PT Start Time 1124    PT Stop Time 1210    PT Time Calculation (min) 46 min    Activity Tolerance Patient tolerated treatment well    Behavior During Therapy WFL for tasks assessed/performed              Past Medical History:  Diagnosis Date   Allergy    Arthritis    LUMBAR SPINE AND RIGHT HIP   Cataract    GERD (gastroesophageal reflux disease)    History of kidney stones 10/2012   Hyperlipidemia    Hypertension    Renal stone    Past Surgical History:  Procedure Laterality Date   ABDOMINAL HYSTERECTOMY  2000   CHOLECYSTECTOMY  04/09/2007   CYSTOSCOPY WITH URETEROSCOPY Right 10/02/2012   Procedure: CYSTOSCOPY WITH RIGHT RETROGRADE AND STENT PLACEMENT ;  Surgeon: Donnice Gwenyth Brooks, MD;  Location: [facility];  Service: Urology;  Laterality: Right;   CYSTOSCOPY WITH URETEROSCOPY Right 11/13/2012   Procedure: RIGHT URETEROSCOPY;  Surgeon: Donnice Gwenyth Brooks, MD;  Location: [facility];  Service: Urology;  Laterality: Right;  with Stent   CYSTOSCOPY/URETEROSCOPY/HOLMIUM LASER/STENT PLACEMENT Right 05/31/2022   Procedure: CYSTOSCOPY RIGHT URETEROSCOPY/HOLMIUM LASER/STENT PLACEMENT;  Surgeon: Brooks Donnice, MD;  Location: [facility];  Service: Urology;  Laterality: Right;  60 MINS   LITHOTRIPSY  10/06/2012   PUBOVAGINAL SLING N/A 04/15/2017   Procedure: CARLOYN GLADE;  Surgeon: Brooks Donnice, MD;  Location: [facility];  Service: Urology;  Laterality: N/A;  NEEDS 90 MIN FOR PROCEDURE   ROTATOR CUFF REPAIR Left    TYMPANOSTOMY TUBE PLACEMENT Right 2019   to clear up MRSA   There are no active problems to display for this patient.   REFERRING  PROVIDER: Beverley Evalene BIRCH, MD  REFERRING DIAG: L TKR  THERAPY DIAG:  Left knee pain, unspecified chronicity  Stiffness of left knee, not elsewhere classified  Muscle weakness (generalized)  Difficulty in walking, not elsewhere classified  Rationale for Evaluation and Treatment: Rehabilitation  ONSET DATE: DOS  12/04/2023  SUBJECTIVE:   SUBJECTIVE STATEMENT: Pt is 3 weeks and 1 day s/p L TKR.  Pt states she was sore after prior Rx and had difficulty sleeping that night.  Pt reports having a lot of stiffness at night.  She was sore the following day also.  Pt is compliant with HEP.    PERTINENT HISTORY: L TKR  12/04/2023 Severe arthritis R knee which pt receives injections, plan for R TKR in December Arthritis in lumbar and R hip also HTN  PAIN:  NPRS:  3-4/10 medial and lateral L knee.     PRECAUTIONS: per dx; R knee OA--planning for R knee TKR   WEIGHT BEARING RESTRICTIONS: FWB  FALLS:  Has patient fallen in last 6 months? Yes. Number of falls 2, R ankle rolled with new shoes ;  fell in the bathroom when the towel bar broke off the wall  LIVING ENVIRONMENT: Lives with: lives with their spouse Lives in: 1 story home Stairs: 3 steps to enter home with 1 rail Has following equipment at home: FWW, crutches, SPC, 3 pronged cane  OCCUPATION: part time  home care, primarily sitting  PLOF: Independent  PATIENT GOALS: to improve pain and mobility   OBJECTIVE:  Note: Objective measures were completed at Evaluation unless otherwise noted.  DIAGNOSTIC FINDINGS: Pt is post op.    LOWER EXTREMITY ROM:   ROM Right eval Left eval Left 9/8 Left 9/12 Left 9/19  Hip flexion       Hip extension       Hip abduction       Hip adduction       Hip internal rotation       Hip external rotation       Knee flexion  69/88 AROM/PROM 97 deg 107 deg  AAROM 119 AROM after PROM and AAROM  Knee extension  0 deg AROM 0 deg AROM    Ankle dorsiflexion       Ankle  plantarflexion       Ankle inversion       Ankle eversion        (Blank rows = not tested)                                                                                                                                TREATMENT:   Reviewed compliance with HEP, pain level, and response to prior Rx.   Pt performed: Scifit bike x 6 mins rocking back and forth SAQ 2x10  1# 2x10 LAQ 2x10 Supine SLR 2x10 reps with cuing to decrease extensor lag with eccentric phase and with min assistance on approx 3-4 reps on the 1 set   PT educated pt's husband and instructed him in assisting pt with supine SLR.   S/L hip abduction 2x10 1/4 squats 2x10 with UE support on counter Supine heel slide with strap x10 reps  Pt received L knee flexion and extension PROM in supine per pt and tissue tolerance.   Manual Therapy:   Grade II PA jt mobs to L knee with knee flexed seated at edge of table Sup/inf patellar mobs  PT gave pt a handout of supine SLR.       PATIENT EDUCATION:  Education details:  Educated pt in post op and protocol restrictions and expectations.  HEP, ROM findings, exercise form, dx, prognosis, and POC.  PT answered pt's questions.  Person educated: Patient Education method: Explanation, Demonstration, Tactile cues, Verbal cues, and Handouts Education comprehension: verbalized understanding, returned demonstration, verbal cues required, and tactile cues required  HOME EXERCISE PROGRAM: Access Code: [email or web address] URL: [email or web address] Date: 12/12/2023 Prepared by: Mose Minerva  Exercises - Supine Quadricep Sets  - 2 x daily - 7 x weekly - 2 sets - 10 reps - 5 seconds hold - Supine Gluteal Sets  - 2 x daily - 7 x weekly - 2 sets - 10 reps - 5 seconds hold - Supine Ankle Pumps  - 7 x weekly - Longsitting Heel Slide  - 2-3 x daily - 7 x weekly - 1 sets -  10 reps -Sitting with knee flexed for 3-5 mins comfortably at various times for 6-7 times/day - Supine Short Arc  Quad  - 1 x daily - 7 x weekly - 2 sets - 10 reps - Supine Heel Slide with Strap  - 2 x daily - 7 x weekly - 1-2 sets - 10 reps - Long Sitting Calf Stretch with Strap  - 2 x daily - 7 x weekly - 2-3 reps - 20 seconds hold - Seated Long Arc Quad  - 1 x daily - 7 x weekly - 2 sets - 10 reps - Heel Raises with Counter Support  - 1 x daily - 7 x weekly - 2 sets - 10 reps  Updated HEP: - Supine Active Straight Leg Raise  - 1 x daily - 7 x weekly - 2 sets - 10 reps    ASSESSMENT:  CLINICAL IMPRESSION: Pt presents to treatment with FWW and is progressing with protocol.  Pt tolerated manual therapy and knee PROM well.  PT assessed knee ROM after PROM and AAROM and she demonstrates excellent flexion AROM.  Pt is improving with quad strength as evidenced by performance of supine SLR.  She had improved independence with supine SLR though does require cuing to decrease extensor lag.  PT educated pt's husband and demonstrated to him how to assist pt with supine SLR.  Pt's husband assisted pt with supine SLR in the clinic.  PT gave pt a HEP handout.  She performed exercises well and had good tolerance with exercises.  She responded well to treatment having no c/o's after treatment.  PT instructed pt in using ice to reduce pain and soreness.  She will benefit from continued skilled PT per protocol to improve pain, ROM, strength, gait, and functional mobility.         OBJECTIVE IMPAIRMENTS: Abnormal gait, decreased activity tolerance, decreased endurance, decreased knowledge of use of DME, decreased mobility, difficulty walking, decreased ROM, decreased strength, hypomobility, increased edema, impaired flexibility, and pain.   ACTIVITY LIMITATIONS: bending, sitting, standing, squatting, stairs, transfers, bed mobility, dressing, and locomotion level  PARTICIPATION LIMITATIONS: meal prep, cleaning, laundry, driving, shopping, and community activity  PERSONAL FACTORS: 1 comorbidity: Arthritis including in R  knee are also affecting patient's functional outcome.   REHAB POTENTIAL: Good  CLINICAL DECISION MAKING: Stable/uncomplicated  EVALUATION COMPLEXITY: Low   GOALS:   SHORT TERM GOALS:  Pt will be independent and compliant with HEP for improved pain, ROM, strength, and function.  Baseline: Goal status: INITIAL Target date:  01/01/24   2.  Pt will demo a good quad set and be able to perform a supine SLR independently for improved strength and stability with gait.  Baseline:  Goal status: INITIAL Target date: 12/25/2023  3.  Pt will demo improved L knee flexion AROM to 95 deg for improved gait, stiffness, and mobility.  Baseline:  Goal status: INITIAL Target date:  01/08/2024  4.  Pt will demo improved quality of gait and ambulate with a SPC with good stability.  Baseline:  Goal status: INITIAL Target date:  01/08/2024   5.  Pt will be able to perform her normal transfers with no > than minimal difficulty.   Baseline:  Goal status: INITIAL    LONG TERM GOALS: Target date: 02/19/2024  Pt will demo improved R knee AROM to 0 - 115 deg for improved mobility and stiffness and for improved performance of stairs.  Baseline:  Goal status: INITIAL  2.  Pt will ambulate  with a normalized heel to toe gait without limping.  Baseline:  Goal status: INITIAL  3.   Pt will ambulate community distance without an AD without significant L knee pain and difficulty.  Baseline:  Goal status: INITIAL  4.   Pt will be able to perform household chores without significant L knee pain and difficulty.  Baseline:  Goal status: INITIAL  5.  Pt will demo improved strength to 4+/5 in R hip abd, 5/5 in Hip flexion, and 5/5 in knee ext and flexion for improved performance of daily functional mobility and to assist with returning to recreational activities.  Baseline:  Goal status: INITIAL  6.  Pt will be able to perform 4-5 steps with a reciprocal gait with the rail for improved performance of  stairs.  Baseline:  Goal status: INITIAL   PLAN:  PT FREQUENCY: 2-3x/wk x 3 weeks and 2x/wk afterwards   PT DURATION: 10 weeks  PLANNED INTERVENTIONS: 97164- PT Re-evaluation, 97750- Physical Performance Testing, 97110-Therapeutic exercises, 97530- Therapeutic activity, 97112- Neuromuscular re-education, 97535- Self Care, 02859- Manual therapy, [phone]- Gait training, [phone]- Aquatic Therapy, [phone]- Electrical stimulation (unattended), Patient/Family education, Balance training, Stair training, Taping, Joint mobilization, Spinal mobilization, Scar mobilization, DME instructions, Cryotherapy, and Moist heat  PLAN FOR NEXT SESSION: Cont per TKR protocol.  Gait training.   Leigh Minerva III PT, DPT 12/26/23 5:32 PM

## 2023-12-28 NOTE — Therapy (Signed)
 OUTPATIENT PHYSICAL THERAPY LOWER EXTREMITYTREATMENT   Patient Name: Laura Mills MRN: [id number] DOB:[birth year], 63 y.o., female Today's Date: 12/30/2023  END OF SESSION:  PT End of Session - 12/29/23 1515     Visit Number 8    Number of Visits 22    Date for Recertification  02/19/24    Authorization Type AETNA    PT Start Time 1500    PT Stop Time 1539    PT Time Calculation (min) 39 min    Activity Tolerance Patient tolerated treatment well    Behavior During Therapy WFL for tasks assessed/performed               Past Medical History:  Diagnosis Date   Allergy    Arthritis    LUMBAR SPINE AND RIGHT HIP   Cataract    GERD (gastroesophageal reflux disease)    History of kidney stones 10/2012   Hyperlipidemia    Hypertension    Renal stone    Past Surgical History:  Procedure Laterality Date   ABDOMINAL HYSTERECTOMY  2000   CHOLECYSTECTOMY  04/09/2007   CYSTOSCOPY WITH URETEROSCOPY Right 10/02/2012   Procedure: CYSTOSCOPY WITH RIGHT RETROGRADE AND STENT PLACEMENT ;  Surgeon: Donnice Gwenyth Brooks, MD;  Location: [facility];  Service: Urology;  Laterality: Right;   CYSTOSCOPY WITH URETEROSCOPY Right 11/13/2012   Procedure: RIGHT URETEROSCOPY;  Surgeon: Donnice Gwenyth Brooks, MD;  Location: [facility];  Service: Urology;  Laterality: Right;  with Stent   CYSTOSCOPY/URETEROSCOPY/HOLMIUM LASER/STENT PLACEMENT Right 05/31/2022   Procedure: CYSTOSCOPY RIGHT URETEROSCOPY/HOLMIUM LASER/STENT PLACEMENT;  Surgeon: Brooks Donnice, MD;  Location: [facility];  Service: Urology;  Laterality: Right;  60 MINS   LITHOTRIPSY  10/06/2012   PUBOVAGINAL SLING N/A 04/15/2017   Procedure: CARLOYN GLADE;  Surgeon: Brooks Donnice, MD;  Location: [facility];  Service: Urology;  Laterality: N/A;  NEEDS 90 MIN FOR PROCEDURE   ROTATOR CUFF REPAIR Left    TYMPANOSTOMY TUBE PLACEMENT Right 2019   to clear up MRSA   There are no active problems to display for this patient.   REFERRING  PROVIDER: Beverley Evalene BIRCH, MD  REFERRING DIAG: L TKR  THERAPY DIAG:  Left knee pain, unspecified chronicity  Stiffness of left knee, not elsewhere classified  Muscle weakness (generalized)  Difficulty in walking, not elsewhere classified  Rationale for Evaluation and Treatment: Rehabilitation  ONSET DATE: DOS  12/04/2023  SUBJECTIVE:   SUBJECTIVE STATEMENT: Pt is 3 weeks and 4 days s/p L TKR.  Pt states she felt really good after prior treatment and had no increased pain.  Pt is compliant with HEP.    PERTINENT HISTORY: L TKR  12/04/2023 Severe arthritis R knee which pt receives injections, plan for R TKR in December Arthritis in lumbar and R hip also HTN  PAIN:  NPRS:  1-2/10 medial and lateral L knee.     PRECAUTIONS: per dx; R knee OA--planning for R knee TKR   WEIGHT BEARING RESTRICTIONS: FWB  FALLS:  Has patient fallen in last 6 months? Yes. Number of falls 2, R ankle rolled with new shoes ;  fell in the bathroom when the towel bar broke off the wall  LIVING ENVIRONMENT: Lives with: lives with their spouse Lives in: 1 story home Stairs: 3 steps to enter home with 1 rail Has following equipment at home: FWW, crutches, SPC, 3 pronged cane  OCCUPATION: part time home care, primarily sitting  PLOF: Independent  PATIENT GOALS: to improve pain and mobility  OBJECTIVE:  Note: Objective measures were completed at Evaluation unless otherwise noted.  DIAGNOSTIC FINDINGS: Pt is post op.    LOWER EXTREMITY ROM:   ROM Right eval Left eval Left 9/8 Left 9/12 Left 9/19  Hip flexion       Hip extension       Hip abduction       Hip adduction       Hip internal rotation       Hip external rotation       Knee flexion  69/88 AROM/PROM 97 deg 107 deg  AAROM 119 AROM after PROM and AAROM  Knee extension  0 deg AROM 0 deg AROM    Ankle dorsiflexion       Ankle plantarflexion       Ankle inversion       Ankle eversion        (Blank rows = not  tested)                                                                                                                                TREATMENT:   Reviewed compliance with HEP, pain level, and response to prior Rx.   Pt performed: Scifit bike x 6 mins full revolutions SAQ 2x10  1# 2x10 LAQ 2x10 Supine SLR 2x10 reps without assistance  S/L hip abduction 2x10 1/4 squats 2x10 with UE support on counter F/B weight shifts in staggered stance with UE support on rail Step ups on 2 inch step x10 reps and on 4 inch step x 10 with UE support  Pt received L knee flexion and extension PROM in supine per pt and tissue tolerance.        PATIENT EDUCATION:  Education details:  Educated pt in post op and protocol restrictions and expectations.  HEP, exercise form, dx, prognosis, and POC.  PT answered pt's questions.  Person educated: Patient Education method: Explanation, Demonstration, Tactile cues, Verbal cues, and Handouts Education comprehension: verbalized understanding, returned demonstration, verbal cues required, and tactile cues required  HOME EXERCISE PROGRAM: Access Code: [email or web address] URL: [email or web address] Date: 12/12/2023 Prepared by: Mose Minerva  Exercises - Supine Quadricep Sets  - 2 x daily - 7 x weekly - 2 sets - 10 reps - 5 seconds hold - Supine Gluteal Sets  - 2 x daily - 7 x weekly - 2 sets - 10 reps - 5 seconds hold - Supine Ankle Pumps  - 7 x weekly - Longsitting Heel Slide  - 2-3 x daily - 7 x weekly - 1 sets - 10 reps -Sitting with knee flexed for 3-5 mins comfortably at various times for 6-7 times/day - Supine Short Arc Quad  - 1 x daily - 7 x weekly - 2 sets - 10 reps - Supine Heel Slide with Strap  - 2 x daily - 7 x weekly - 1-2 sets - 10 reps - Long Sitting Calf Stretch with Strap  - 2  x daily - 7 x weekly - 2-3 reps - 20 seconds hold - Seated Long Arc Quad  - 1 x daily - 7 x weekly - 2 sets - 10 reps - Heel Raises with Counter Support  - 1 x  daily - 7 x weekly - 2 sets - 10 reps - Supine Active Straight Leg Raise  - 1 x daily - 7 x weekly - 2 sets - 10 reps    ASSESSMENT:  CLINICAL IMPRESSION: Pt continues to make good progress in all areas.  Pt is able to perform full revolutions on bike.  She has excellent flexion ROM at this time.  Pt continues to have quad weakness though is improving as evidenced by performance of exercises.  Pt performed supine SLR without assistance from PT today.  She does require cuing to decrease extensor lag though is improving.  Pt is progressing with protocol.  She performed 4 inch step ups well with UE support on rail.  PT instructed pt in correct form with 1/4 squats and pt demonstrated good form with cuing.  She responded well to treatment having no c/o's after treatment.          OBJECTIVE IMPAIRMENTS: Abnormal gait, decreased activity tolerance, decreased endurance, decreased knowledge of use of DME, decreased mobility, difficulty walking, decreased ROM, decreased strength, hypomobility, increased edema, impaired flexibility, and pain.   ACTIVITY LIMITATIONS: bending, sitting, standing, squatting, stairs, transfers, bed mobility, dressing, and locomotion level  PARTICIPATION LIMITATIONS: meal prep, cleaning, laundry, driving, shopping, and community activity  PERSONAL FACTORS: 1 comorbidity: Arthritis including in R knee are also affecting patient's functional outcome.   REHAB POTENTIAL: Good  CLINICAL DECISION MAKING: Stable/uncomplicated  EVALUATION COMPLEXITY: Low   GOALS:   SHORT TERM GOALS:  Pt will be independent and compliant with HEP for improved pain, ROM, strength, and function.  Baseline: Goal status: INITIAL Target date:  01/01/24   2.  Pt will demo a good quad set and be able to perform a supine SLR independently for improved strength and stability with gait.  Baseline:  Goal status: INITIAL Target date: 12/25/2023  3.  Pt will demo improved L knee flexion AROM to  95 deg for improved gait, stiffness, and mobility.  Baseline:  Goal status: INITIAL Target date:  01/08/2024  4.  Pt will demo improved quality of gait and ambulate with a SPC with good stability.  Baseline:  Goal status: INITIAL Target date:  01/08/2024   5.  Pt will be able to perform her normal transfers with no > than minimal difficulty.   Baseline:  Goal status: INITIAL    LONG TERM GOALS: Target date: 02/19/2024  Pt will demo improved R knee AROM to 0 - 115 deg for improved mobility and stiffness and for improved performance of stairs.  Baseline:  Goal status: INITIAL  2.  Pt will ambulate with a normalized heel to toe gait without limping.  Baseline:  Goal status: INITIAL  3.   Pt will ambulate community distance without an AD without significant L knee pain and difficulty.  Baseline:  Goal status: INITIAL  4.   Pt will be able to perform household chores without significant L knee pain and difficulty.  Baseline:  Goal status: INITIAL  5.  Pt will demo improved strength to 4+/5 in R hip abd, 5/5 in Hip flexion, and 5/5 in knee ext and flexion for improved performance of daily functional mobility and to assist with returning to recreational activities.  Baseline:  Goal status: INITIAL  6.  Pt will be able to perform 4-5 steps with a reciprocal gait with the rail for improved performance of stairs.  Baseline:  Goal status: INITIAL   PLAN:  PT FREQUENCY: 2-3x/wk x 3 weeks and 2x/wk afterwards   PT DURATION: 10 weeks  PLANNED INTERVENTIONS: 97164- PT Re-evaluation, 97750- Physical Performance Testing, 97110-Therapeutic exercises, 97530- Therapeutic activity, 97112- Neuromuscular re-education, 97535- Self Care, 02859- Manual therapy, [phone]- Gait training, [phone]- Aquatic Therapy, [phone]- Electrical stimulation (unattended), Patient/Family education, Balance training, Stair training, Taping, Joint mobilization, Spinal mobilization, Scar mobilization, DME instructions,  Cryotherapy, and Moist heat  PLAN FOR NEXT SESSION: Cont per TKR protocol.  Gait training.   Leigh Minerva III PT, DPT 12/30/23 10:57 PM

## 2023-12-29 ENCOUNTER — Encounter (HOSPITAL_BASED_OUTPATIENT_CLINIC_OR_DEPARTMENT_OTHER): Payer: Self-pay | Admitting: Physical Therapy

## 2023-12-29 ENCOUNTER — Ambulatory Visit (HOSPITAL_BASED_OUTPATIENT_CLINIC_OR_DEPARTMENT_OTHER): Admitting: Physical Therapy

## 2023-12-29 DIAGNOSIS — M25562 Pain in left knee: Secondary | ICD-10-CM | POA: Diagnosis not present

## 2023-12-29 DIAGNOSIS — Z96652 Presence of left artificial knee joint: Secondary | ICD-10-CM | POA: Diagnosis not present

## 2023-12-29 DIAGNOSIS — M25662 Stiffness of left knee, not elsewhere classified: Secondary | ICD-10-CM | POA: Diagnosis not present

## 2023-12-29 DIAGNOSIS — Z471 Aftercare following joint replacement surgery: Secondary | ICD-10-CM | POA: Diagnosis not present

## 2023-12-29 DIAGNOSIS — R262 Difficulty in walking, not elsewhere classified: Secondary | ICD-10-CM

## 2023-12-29 DIAGNOSIS — M6281 Muscle weakness (generalized): Secondary | ICD-10-CM | POA: Diagnosis not present

## 2023-12-31 ENCOUNTER — Ambulatory Visit (HOSPITAL_BASED_OUTPATIENT_CLINIC_OR_DEPARTMENT_OTHER): Admitting: Physical Therapy

## 2023-12-31 ENCOUNTER — Encounter (HOSPITAL_BASED_OUTPATIENT_CLINIC_OR_DEPARTMENT_OTHER): Payer: Self-pay | Admitting: Physical Therapy

## 2023-12-31 DIAGNOSIS — R262 Difficulty in walking, not elsewhere classified: Secondary | ICD-10-CM | POA: Diagnosis not present

## 2023-12-31 DIAGNOSIS — M25662 Stiffness of left knee, not elsewhere classified: Secondary | ICD-10-CM

## 2023-12-31 DIAGNOSIS — Z96652 Presence of left artificial knee joint: Secondary | ICD-10-CM | POA: Diagnosis not present

## 2023-12-31 DIAGNOSIS — M25562 Pain in left knee: Secondary | ICD-10-CM | POA: Diagnosis not present

## 2023-12-31 DIAGNOSIS — Z471 Aftercare following joint replacement surgery: Secondary | ICD-10-CM | POA: Diagnosis not present

## 2023-12-31 DIAGNOSIS — M6281 Muscle weakness (generalized): Secondary | ICD-10-CM | POA: Diagnosis not present

## 2023-12-31 NOTE — Therapy (Signed)
 OUTPATIENT PHYSICAL THERAPY LOWER EXTREMITYTREATMENT   Patient Name: Laura Mills MRN: [id number] DOB:1960-08-20, 63 y.o., female Today's Date: 01/01/2024  END OF SESSION:  PT End of Session - 12/31/23 1616     Visit Number 9    Number of Visits 22    Date for Recertification  02/19/24    Authorization Type AET[employer]    PT Start Time 1541    PT Stop Time 1622    PT Time Calculation (min) 41 min    Activity Tolerance Patient tolerated treatment well    Behavior During Therapy [facility] for tasks assessed/performed                Past Medical History:  Diagnosis Date   Allergy    Arthritis    LUMBAR SPINE AND RIGHT HIP   Cataract    GERD (gastroesophageal reflux disease)    History of kidney stones 10/2012   Hyperlipidemia    Hypertension    Renal stone    Past Surgical History:  Procedure Laterality Date   ABDOMINAL HYSTERECTOMY  2000   CHOLECYSTECTOMY  04/09/2007   CYSTOSCOPY WITH URETEROSCOPY Right 10/02/2012   Procedure: CYSTOSCOPY WITH RIGHT RETROGRADE AND STENT PLACEMENT ;  Surgeon: Donnice Gwenyth Brooks, MD;  Location: [facility];  Service: Urology;  Laterality: Right;   CYSTOSCOPY WITH URETEROSCOPY Right 11/13/2012   Procedure: RIGHT URETEROSCOPY;  Surgeon: Donnice Gwenyth Brooks, MD;  Location: [facility];  Service: Urology;  Laterality: Right;  with Stent   CYSTOSCOPY/URETEROSCOPY/HOLMIUM LASER/STENT PLACEMENT Right 05/31/2022   Procedure: CYSTOSCOPY RIGHT URETEROSCOPY/HOLMIUM LASER/STENT PLACEMENT;  Surgeon: Brooks Donnice, MD;  Location: [facility];  Service: Urology;  Laterality: Right;  60 MINS   LITHOTRIPSY  10/06/2012   PUBOVAGINAL SLING N/A 04/15/2017   Procedure: CARLOYN GLADE;  Surgeon: Brooks Donnice, MD;  Location: [facility];  Service: Urology;  Laterality: N/A;  NEEDS 90 MIN FOR PROCEDURE   ROTATOR CUFF REPAIR Left    TYMPANOSTOMY TUBE PLACEMENT Right 2019   to clear up MRSA   There are no active problems to display for this  patient.   REFERRING PROVIDER: Beverley Evalene BIRCH, MD  REFERRING DIAG: L TKR  THERAPY DIAG:  Left knee pain, unspecified chronicity  Stiffness of left knee, not elsewhere classified  Muscle weakness (generalized)  Difficulty in walking, not elsewhere classified  Rationale for Evaluation and Treatment: Rehabilitation  ONSET DATE: DOS  12/04/2023  SUBJECTIVE:   SUBJECTIVE STATEMENT: Pt is 3 weeks and 6 days s/p L TKR.  Pt states she had increased and soreness after prior Rx.  Pt had difficulty sleeping that night, but slept good the following night.  Pt is compliant with HEP.    PERTINENT HISTORY: L TKR  12/04/2023 Severe arthritis R knee which pt receives injections, plan for R TKR in December Arthritis in lumbar and R hip also HTN  PAIN:  NPRS:  1-2/10 medial and lateral L knee.     PRECAUTIONS: per dx; R knee OA--planning for R knee TKR   WEIGHT BEARING RESTRICTIONS: FWB  FALLS:  Has patient fallen in last 6 months? Yes. Number of falls 2, R ankle rolled with new shoes ;  fell in the bathroom when the towel bar broke off the wall  LIVING ENVIRONMENT: Lives with: lives with their spouse Lives in: 1 story home Stairs: 3 steps to enter home with 1 rail Has following equipment at home: FWW, crutches, SPC, 3 pronged cane  OCCUPATION: part time home care, primarily sitting  PLOF: Independent  PATIENT GOALS: to improve pain and mobility   OBJECTIVE:  Note: Objective measures were completed at Evaluation unless otherwise noted.  DIAGNOSTIC FINDINGS: Pt is post op.    LOWER EXTREMITY ROM:   ROM Right eval Left eval Left 9/8 Left 9/12 Left 9/19  Hip flexion       Hip extension       Hip abduction       Hip adduction       Hip internal rotation       Hip external rotation       Knee flexion  69/88 AROM/PROM 97 deg 107 deg  AAROM 119 AROM after PROM and AAROM  Knee extension  0 deg AROM 0 deg AROM    Ankle dorsiflexion       Ankle plantarflexion        Ankle inversion       Ankle eversion        (Blank rows = not tested)                                                                                                                                TREATMENT:   Reviewed compliance with HEP, pain level, and response to prior Rx.   Pt performed: Scifit bike x 5 mins full revolutions SAQ 2x10  1# 2x10 LAQ 2x10 Supine SLR 2x10 reps without assistance  1/4 squats 2x10 with UE support on counter F/B weight shifts in staggered stance and S/S wt shifts x 10 each with UE support on rail Step ups 4 inch step 2x10 with UE support Supine heel slides with strap 2x10  Pt received L knee flexion and extension PROM in supine per pt and tissue tolerance.        PATIENT EDUCATION:  Education details:  Educated pt in post op and protocol restrictions and expectations.  HEP, exercise form, dx, prognosis, and POC.  PT answered pt's questions.  PT instructed pt to bring her cane in next visit.   Person educated: Patient Education method: Explanation, Demonstration, Tactile cues, Verbal cues, Education comprehension: verbalized understanding, returned demonstration, verbal cues required, and tactile cues required  HOME EXERCISE PROGRAM: Access Code: [email or web address] URL: [email or web address] Date: 12/12/2023 Prepared by: Mose Minerva  Exercises - Supine Quadricep Sets  - 2 x daily - 7 x weekly - 2 sets - 10 reps - 5 seconds hold - Supine Gluteal Sets  - 2 x daily - 7 x weekly - 2 sets - 10 reps - 5 seconds hold - Supine Ankle Pumps  - 7 x weekly - Longsitting Heel Slide  - 2-3 x daily - 7 x weekly - 1 sets - 10 reps -Sitting with knee flexed for 3-5 mins comfortably at various times for 6-7 times/day - Supine Short Arc Quad  - 1 x daily - 7 x weekly - 2 sets - 10 reps - Supine Heel Slide with Strap  - 2 x daily -  7 x weekly - 1-2 sets - 10 reps - Long Sitting Calf Stretch with Strap  - 2 x daily - 7 x weekly - 2-3 reps - 20 seconds  hold - Seated Long Arc Quad  - 1 x daily - 7 x weekly - 2 sets - 10 reps - Heel Raises with Counter Support  - 1 x daily - 7 x weekly - 2 sets - 10 reps - Supine Active Straight Leg Raise  - 1 x daily - 7 x weekly - 2 sets - 10 reps    ASSESSMENT:  CLINICAL IMPRESSION: Pt continues to make good progress in all areas.  She has excellent flexion ROM at this time.  Pt is making progress with quad strength as evidenced by performance of exercises.  Pt performed protocol exercises well and continues to require instruction in correct form with 1/4 squats.  She demonstrates good form with 1/4 squats with instruction and cuing.  She responded well to treatment stating she feels good after treatment.  Pt will benefit from cont skilled PT per protocol to address goals and impairments and improve overall function.      OBJECTIVE IMPAIRMENTS: Abnormal gait, decreased activity tolerance, decreased endurance, decreased knowledge of use of DME, decreased mobility, difficulty walking, decreased ROM, decreased strength, hypomobility, increased edema, impaired flexibility, and pain.   ACTIVITY LIMITATIONS: bending, sitting, standing, squatting, stairs, transfers, bed mobility, dressing, and locomotion level  PARTICIPATION LIMITATIONS: meal prep, cleaning, laundry, driving, shopping, and community activity  PERSONAL FACTORS: 1 comorbidity: Arthritis including in R knee are also affecting patient's functional outcome.   REHAB POTENTIAL: Good  CLINICAL DECISION MAKING: Stable/uncomplicated  EVALUATION COMPLEXITY: Low   GOALS:   SHORT TERM GOALS:  Pt will be independent and compliant with HEP for improved pain, ROM, strength, and function.  Baseline: Goal status: INITIAL Target date:  01/01/24   2.  Pt will demo a good quad set and be able to perform a supine SLR independently for improved strength and stability with gait.  Baseline:  Goal status: INITIAL Target date: 12/25/2023  3.  Pt will demo  improved L knee flexion AROM to 95 deg for improved gait, stiffness, and mobility.  Baseline:  Goal status: INITIAL Target date:  01/08/2024  4.  Pt will demo improved quality of gait and ambulate with a SPC with good stability.  Baseline:  Goal status: INITIAL Target date:  01/08/2024   5.  Pt will be able to perform her normal transfers with no > than minimal difficulty.   Baseline:  Goal status: INITIAL    LONG TERM GOALS: Target date: 02/19/2024  Pt will demo improved R knee AROM to 0 - 115 deg for improved mobility and stiffness and for improved performance of stairs.  Baseline:  Goal status: INITIAL  2.  Pt will ambulate with a normalized heel to toe gait without limping.  Baseline:  Goal status: INITIAL  3.   Pt will ambulate community distance without an AD without significant L knee pain and difficulty.  Baseline:  Goal status: INITIAL  4.   Pt will be able to perform household chores without significant L knee pain and difficulty.  Baseline:  Goal status: INITIAL  5.  Pt will demo improved strength to 4+/5 in R hip abd, 5/5 in Hip flexion, and 5/5 in knee ext and flexion for improved performance of daily functional mobility and to assist with returning to recreational activities.  Baseline:  Goal status: INITIAL  6.  Pt will be able to perform 4-5 steps with a reciprocal gait with the rail for improved performance of stairs.  Baseline:  Goal status: INITIAL   PLAN:  PT FREQUENCY: 2-3x/wk x 3 weeks and 2x/wk afterwards   PT DURATION: 10 weeks  PLANNED INTERVENTIONS: 97164- PT Re-evaluation, 97750- Physical Performance Testing, 97110-Therapeutic exercises, 97530- Therapeutic activity, 97112- Neuromuscular re-education, 97535- Self Care, 02859- Manual therapy, [phone]- Gait training, [phone]- Aquatic Therapy, [phone]- Electrical stimulation (unattended), Patient/Family education, Balance training, Stair training, Taping, Joint mobilization, Spinal mobilization, Scar  mobilization, DME instructions, Cryotherapy, and Moist heat  PLAN FOR NEXT SESSION: Cont per TKR protocol.  Gait training.  PN next visit.  Pt to bring her cane next treatment and practice gait with cane.     Laura Mills PT, DPT 01/02/24 12:10 AM

## 2024-01-02 ENCOUNTER — Encounter (HOSPITAL_BASED_OUTPATIENT_CLINIC_OR_DEPARTMENT_OTHER): Payer: Self-pay | Admitting: Physical Therapy

## 2024-01-02 ENCOUNTER — Ambulatory Visit (HOSPITAL_BASED_OUTPATIENT_CLINIC_OR_DEPARTMENT_OTHER): Payer: Self-pay | Admitting: Physical Therapy

## 2024-01-02 DIAGNOSIS — M25562 Pain in left knee: Secondary | ICD-10-CM

## 2024-01-02 DIAGNOSIS — M25662 Stiffness of left knee, not elsewhere classified: Secondary | ICD-10-CM

## 2024-01-02 DIAGNOSIS — Z96652 Presence of left artificial knee joint: Secondary | ICD-10-CM | POA: Diagnosis not present

## 2024-01-02 DIAGNOSIS — M6281 Muscle weakness (generalized): Secondary | ICD-10-CM

## 2024-01-02 DIAGNOSIS — Z471 Aftercare following joint replacement surgery: Secondary | ICD-10-CM | POA: Diagnosis not present

## 2024-01-02 DIAGNOSIS — R262 Difficulty in walking, not elsewhere classified: Secondary | ICD-10-CM | POA: Diagnosis not present

## 2024-01-02 NOTE — Therapy (Addendum)
 OUTPATIENT PHYSICAL THERAPY LOWER EXTREMITYTREATMENT   Patient Name: Laura Mills MRN: [id number] DOB:[birth year], 63 y.o., female Today's Date: 01/02/2024  END OF SESSION:  PT End of Session - 01/02/24 1039     Visit Number 10    Number of Visits 22    Date for Recertification  02/19/24    Authorization Type AET[employer]    PT Start Time 1031    PT Stop Time 1113    PT Time Calculation (min) 42 min    Activity Tolerance Patient tolerated treatment well    Behavior During Therapy [facility] for tasks assessed/performed                Past Medical History:  Diagnosis Date   Allergy    Arthritis    LUMBAR SPINE AND RIGHT HIP   Cataract    GERD (gastroesophageal reflux disease)    History of kidney stones 10/2012   Hyperlipidemia    Hypertension    Renal stone    Past Surgical History:  Procedure Laterality Date   ABDOMINAL HYSTERECTOMY  2000   CHOLECYSTECTOMY  04/09/2007   CYSTOSCOPY WITH URETEROSCOPY Right 10/02/2012   Procedure: CYSTOSCOPY WITH RIGHT RETROGRADE AND STENT PLACEMENT ;  Surgeon: Donnice Gwenyth Brooks, MD;  Location: [facility];  Service: Urology;  Laterality: Right;   CYSTOSCOPY WITH URETEROSCOPY Right 11/13/2012   Procedure: RIGHT URETEROSCOPY;  Surgeon: Donnice Gwenyth Brooks, MD;  Location: [facility];  Service: Urology;  Laterality: Right;  with Stent   CYSTOSCOPY/URETEROSCOPY/HOLMIUM LASER/STENT PLACEMENT Right 05/31/2022   Procedure: CYSTOSCOPY RIGHT URETEROSCOPY/HOLMIUM LASER/STENT PLACEMENT;  Surgeon: Brooks Donnice, MD;  Location: [facility];  Service: Urology;  Laterality: Right;  60 MINS   LITHOTRIPSY  10/06/2012   PUBOVAGINAL SLING N/A 04/15/2017   Procedure: CARLOYN GLADE;  Surgeon: Brooks Donnice, MD;  Location: [facility];  Service: Urology;  Laterality: N/A;  NEEDS 90 MIN FOR PROCEDURE   ROTATOR CUFF REPAIR Left    TYMPANOSTOMY TUBE PLACEMENT Right 2019   to clear up MRSA   There are no active problems to display for this  patient.   REFERRING PROVIDER: Beverley Evalene BIRCH, MD  REFERRING DIAG: L TKR  THERAPY DIAG:  Left knee pain, unspecified chronicity  Stiffness of left knee, not elsewhere classified  Muscle weakness (generalized)  Difficulty in walking, not elsewhere classified  Rationale for Evaluation and Treatment: Rehabilitation  ONSET DATE: DOS  12/04/2023  SUBJECTIVE:   SUBJECTIVE STATEMENT: Pt is 4 weeks and 1 day s/p L TKR.  Pt states she had increased and soreness after prior Rx.  Pt reports improved ROM.  She is able to lift L LE.  Pt is independently donning shoes/socks.  Walking is easier.  Pt is compliant with HEP.  Pt reports she has numbness in lateral knee.  Pt states she has been walking in home without the walker because she forgets it.     PERTINENT HISTORY: L TKR  12/04/2023 Severe arthritis R knee which pt receives injections, plan for R TKR in December Arthritis in lumbar and R hip also HTN  PAIN:  NPRS:  1-2/10 medial and lateral L knee.     PRECAUTIONS: per dx; R knee OA--planning for R knee TKR   WEIGHT BEARING RESTRICTIONS: FWB  FALLS:  Has patient fallen in last 6 months? Yes. Number of falls 2, R ankle rolled with new shoes ;  fell in the bathroom when the towel bar broke off the wall  LIVING ENVIRONMENT: Lives with: lives with their spouse  Lives in: 1 story home Stairs: 3 steps to enter home with 1 rail Has following equipment at home: FWW, crutches, SPC, 3 pronged cane  OCCUPATION: part time home care, primarily sitting  PLOF: Independent  PATIENT GOALS: to improve pain and mobility   OBJECTIVE:  Note: Objective measures were completed at Evaluation unless otherwise noted.  DIAGNOSTIC FINDINGS: Pt is post op.    LOWER EXTREMITY ROM:   ROM Right eval Left eval Left 9/8 Left 9/12 Left 9/19 Left 9/26 AROM  Hip flexion        Hip extension        Hip abduction        Hip adduction        Hip internal rotation        Hip external  rotation        Knee flexion  69/88 AROM/PROM 97 deg 107 deg  AAROM 119 AROM after PROM and AAROM 125  Knee extension  0 deg AROM 0 deg AROM   0  Ankle dorsiflexion        Ankle plantarflexion        Ankle inversion        Ankle eversion         (Blank rows = not tested)                                                                                                                                TREATMENT:   Reviewed compliance with HEP, pain level, and response to prior Rx.   PT assessed ROM.  Pt performed supine SLR independently with extensor lag.   Gait Training:  PT adjusted cane to proper height and educated pt in correct height.  Pt ambulated with a SPC in hallway and PT instructed pt in proper sequencing with cane.   Pt performed: Scifit bike x 5 mins full revolutions Supine SLR 2x10 1/4 squats 2x10 with UE support on counter Standing heel raises 2x10 Step ups 4 inch step 2x10 with UE support Standing hip abduction 2x10 L LE Lateral step ups 2x10  4 inch step LAQ 2x10  LEFS:  38/80      PATIENT EDUCATION:  Education details:  Educated pt in post op and protocol restrictions and expectations.  ROM, gait, HEP, exercise form, dx, prognosis, and POC.  PT answered pt's questions.  PT instructed pt to bring her cane in next visit.   Person educated: Patient Education method: Explanation, Demonstration, Tactile cues, Verbal cues, Education comprehension: verbalized understanding, returned demonstration, verbal cues required, and tactile cues required  HOME EXERCISE PROGRAM: Access Code: [email or web address] URL: [email or web address] Date: 12/12/2023 Prepared by: Mose Minerva  Exercises - Supine Quadricep Sets  - 2 x daily - 7 x weekly - 2 sets - 10 reps - 5 seconds hold - Supine Gluteal Sets  - 2 x daily - 7 x weekly - 2 sets - 10 reps - 5 seconds hold -  Supine Ankle Pumps  - 7 x weekly - Longsitting Heel Slide  - 2-3 x daily - 7 x weekly - 1 sets - 10  reps -Sitting with knee flexed for 3-5 mins comfortably at various times for 6-7 times/day - Supine Short Arc Quad  - 1 x daily - 7 x weekly - 2 sets - 10 reps - Supine Heel Slide with Strap  - 2 x daily - 7 x weekly - 1-2 sets - 10 reps - Long Sitting Calf Stretch with Strap  - 2 x daily - 7 x weekly - 2-3 reps - 20 seconds hold - Seated Long Arc Quad  - 1 x daily - 7 x weekly - 2 sets - 10 reps - Heel Raises with Counter Support  - 1 x daily - 7 x weekly - 2 sets - 10 reps - Supine Active Straight Leg Raise  - 1 x daily - 7 x weekly - 2 sets - 10 reps    ASSESSMENT:  CLINICAL IMPRESSION: Pt is progressing well in all areas.  She has excellent knee AROM.  Pt is making progress with quad strength.  She can perform supine SLR independently though does have an extensor lag.  Pt is ambulating with a FWW.  PT had pt to attempt gait with a cane today.  PT instructed pt in appropriate height of cane and proper gait sequencing with cane.  She had no LOB or instability with cane.  PT instructed pt and husband to continue ambulation with FWW due to continued quad weakness.  Pt demonstrated clinically significant improvement in self perceived disability with LEFS improving from 28/80 to 38/80.  Pt is progressing appropriately with protocol and performed protocol exercises well.  Pt has met STG's #1,3 and LTG #1.  Pt responded well to Rx having no c/o's after Rx.  Pt will benefit from cont skilled PT per protocol to address goals and impairments and improve overall function.      OBJECTIVE IMPAIRMENTS: Abnormal gait, decreased activity tolerance, decreased endurance, decreased knowledge of use of DME, decreased mobility, difficulty walking, decreased ROM, decreased strength, hypomobility, increased edema, impaired flexibility, and pain.   ACTIVITY LIMITATIONS: bending, sitting, standing, squatting, stairs, transfers, bed mobility, dressing, and locomotion level  PARTICIPATION LIMITATIONS: meal prep,  cleaning, laundry, driving, shopping, and community activity  PERSONAL FACTORS: 1 comorbidity: Arthritis including in R knee are also affecting patient's functional outcome.   REHAB POTENTIAL: Good  CLINICAL DECISION MAKING: Stable/uncomplicated  EVALUATION COMPLEXITY: Low   GOALS:   SHORT TERM GOALS:  Pt will be independent and compliant with HEP for improved pain, ROM, strength, and function.  Baseline: Goal status: GOAL MET Target date:  01/01/24   2.  Pt will demo a good quad set and be able to perform a supine SLR independently for improved strength and stability with gait.  Baseline:  Goal status: PROGRESSING Target date: 12/25/2023  3.  Pt will demo improved L knee flexion AROM to 95 deg for improved gait, stiffness, and mobility.  Baseline:  Goal status: GOAL MET 9/26 Target date:  01/08/2024  4.  Pt will demo improved quality of gait and ambulate with a SPC with good stability.  Baseline:  Goal status: INITIAL Target date:  01/08/2024   5.  Pt will be able to perform her normal transfers with no > than minimal difficulty.   Baseline:  Goal status: INITIAL    LONG TERM GOALS: Target date: 02/19/2024  Pt will demo improved R  knee AROM to 0 - 115 deg for improved mobility and stiffness and for improved performance of stairs.  Baseline:  Goal status: GOAL MET  9/26  2.  Pt will ambulate with a normalized heel to toe gait without limping.  Baseline:  Goal status: INITIAL  3.   Pt will ambulate community distance without an AD without significant L knee pain and difficulty.  Baseline:  Goal status: INITIAL  4.   Pt will be able to perform household chores without significant L knee pain and difficulty.  Baseline:  Goal status: INITIAL  5.  Pt will demo improved strength to 4+/5 in R hip abd, 5/5 in Hip flexion, and 5/5 in knee ext and flexion for improved performance of daily functional mobility and to assist with returning to recreational activities.   Baseline:  Goal status: INITIAL  6.  Pt will be able to perform 4-5 steps with a reciprocal gait with the rail for improved performance of stairs.  Baseline:  Goal status: INITIAL   PLAN:  PT FREQUENCY: 2-3x/wk x 1-2 weeks and 2x/wk afterwards   PT DURATION: 7 weeks  PLANNED INTERVENTIONS: 97164- PT Re-evaluation, 97750- Physical Performance Testing, 97110-Therapeutic exercises, 97530- Therapeutic activity, 97112- Neuromuscular re-education, 97535- Self Care, 02859- Manual therapy, [phone]- Gait training, [phone]- Aquatic Therapy, [phone]- Electrical stimulation (unattended), Patient/Family education, Balance training, Stair training, Taping, Joint mobilization, Spinal mobilization, Scar mobilization, DME instructions, Cryotherapy, and Moist heat  PLAN FOR NEXT SESSION: Cont per TKR protocol.  Gait training.       Leigh Minerva III PT, DPT 01/02/24 5:57 PM

## 2024-01-05 ENCOUNTER — Ambulatory Visit (HOSPITAL_BASED_OUTPATIENT_CLINIC_OR_DEPARTMENT_OTHER): Admitting: Physical Therapy

## 2024-01-05 ENCOUNTER — Encounter (HOSPITAL_BASED_OUTPATIENT_CLINIC_OR_DEPARTMENT_OTHER): Payer: Self-pay | Admitting: Physical Therapy

## 2024-01-05 DIAGNOSIS — Z96652 Presence of left artificial knee joint: Secondary | ICD-10-CM | POA: Diagnosis not present

## 2024-01-05 DIAGNOSIS — R262 Difficulty in walking, not elsewhere classified: Secondary | ICD-10-CM

## 2024-01-05 DIAGNOSIS — M25562 Pain in left knee: Secondary | ICD-10-CM

## 2024-01-05 DIAGNOSIS — M6281 Muscle weakness (generalized): Secondary | ICD-10-CM | POA: Diagnosis not present

## 2024-01-05 DIAGNOSIS — M25662 Stiffness of left knee, not elsewhere classified: Secondary | ICD-10-CM

## 2024-01-05 DIAGNOSIS — Z471 Aftercare following joint replacement surgery: Secondary | ICD-10-CM | POA: Diagnosis not present

## 2024-01-05 NOTE — Therapy (Signed)
 OUTPATIENT PHYSICAL THERAPY LOWER EXTREMITYTREATMENT   Patient Name: Aniah Pauli MRN: [id number] DOB:[birth year], 63 y.o., female Today's Date: 01/06/2024  END OF SESSION:  PT End of Session - 01/05/24 1500     Visit Number 11    Number of Visits 22    Date for Recertification  02/19/24    Authorization Type AETNA    PT Start Time 1456    PT Stop Time 1540    PT Time Calculation (min) 44 min    Activity Tolerance Patient tolerated treatment well    Behavior During Therapy WFL for tasks assessed/performed                Past Medical History:  Diagnosis Date   Allergy    Arthritis    LUMBAR SPINE AND RIGHT HIP   Cataract    GERD (gastroesophageal reflux disease)    History of kidney stones 10/2012   Hyperlipidemia    Hypertension    Renal stone    Past Surgical History:  Procedure Laterality Date   ABDOMINAL HYSTERECTOMY  2000   CHOLECYSTECTOMY  04/09/2007   CYSTOSCOPY WITH URETEROSCOPY Right 10/02/2012   Procedure: CYSTOSCOPY WITH RIGHT RETROGRADE AND STENT PLACEMENT ;  Surgeon: Donnice Gwenyth Brooks, MD;  Location: [facility];  Service: Urology;  Laterality: Right;   CYSTOSCOPY WITH URETEROSCOPY Right 11/13/2012   Procedure: RIGHT URETEROSCOPY;  Surgeon: Donnice Gwenyth Brooks, MD;  Location: [facility];  Service: Urology;  Laterality: Right;  with Stent   CYSTOSCOPY/URETEROSCOPY/HOLMIUM LASER/STENT PLACEMENT Right 05/31/2022   Procedure: CYSTOSCOPY RIGHT URETEROSCOPY/HOLMIUM LASER/STENT PLACEMENT;  Surgeon: Brooks Donnice, MD;  Location: [facility];  Service: Urology;  Laterality: Right;  60 MINS   LITHOTRIPSY  10/06/2012   PUBOVAGINAL SLING N/A 04/15/2017   Procedure: CARLOYN GLADE;  Surgeon: Brooks Donnice, MD;  Location: [facility];  Service: Urology;  Laterality: N/A;  NEEDS 90 MIN FOR PROCEDURE   ROTATOR CUFF REPAIR Left    TYMPANOSTOMY TUBE PLACEMENT Right 2019   to clear up MRSA   There are no active problems to display for this  patient.   REFERRING PROVIDER: Beverley Evalene BIRCH, MD  REFERRING DIAG: L TKR  THERAPY DIAG:  Left knee pain, unspecified chronicity  Stiffness of left knee, not elsewhere classified  Muscle weakness (generalized)  Difficulty in walking, not elsewhere classified  Rationale for Evaluation and Treatment: Rehabilitation  ONSET DATE: DOS  12/04/2023  SUBJECTIVE:   SUBJECTIVE STATEMENT: Pt is 4 weeks and 4 days s/p L TKR.  Pt went to her niece's babdy dedication at church and then went to her sister's home.  She was more active yesterday and had soreness that evening.  Her knee was more uncomfortable last night.  Pt states she was ok after Friday's PT appt.        PERTINENT HISTORY: L TKR  12/04/2023 Severe arthritis R knee which pt receives injections, plan for R TKR in December Arthritis in lumbar and R hip also HTN  PAIN:  NPRS:  1-2/10 medial and lateral L knee.     PRECAUTIONS: per dx; R knee OA--planning for R knee TKR   WEIGHT BEARING RESTRICTIONS: FWB  FALLS:  Has patient fallen in last 6 months? Yes. Number of falls 2, R ankle rolled with new shoes ;  fell in the bathroom when the towel bar broke off the wall  LIVING ENVIRONMENT: Lives with: lives with their spouse Lives in: 1 story home Stairs: 3 steps to enter home with 1 rail Has following  equipment at home: FWW, crutches, SPC, 3 pronged cane  OCCUPATION: part time home care, primarily sitting  PLOF: Independent  PATIENT GOALS: to improve pain and mobility   OBJECTIVE:  Note: Objective measures were completed at Evaluation unless otherwise noted.  DIAGNOSTIC FINDINGS: Pt is post op.    LOWER EXTREMITY ROM:   ROM Right eval Left eval Left 9/8 Left 9/12 Left 9/19 Left 9/26 AROM  Hip flexion        Hip extension        Hip abduction        Hip adduction        Hip internal rotation        Hip external rotation        Knee flexion  69/88 AROM/PROM 97 deg 107 deg  AAROM 119 AROM after  PROM and AAROM 125  Knee extension  0 deg AROM 0 deg AROM   0  Ankle dorsiflexion        Ankle plantarflexion        Ankle inversion        Ankle eversion         (Blank rows = not tested)                                                                                                                                TREATMENT:    Pt performed: Scifit bike x 5 mins full revolutions Supine SLR 2x10 S/L hip abduction 2x10 LAQ 2x10 Seated HS curls with YTB 2x10 TKE with RTB 2x10 1/4 squats 2x10 with UE support on counter Standing heel raises 2x10 Step ups 4 inch step 2x10 with UE support Standing hip abduction 2x10 L LE Lateral step ups 2x10  4 inch step  Pt ambulated 300 ft with SPC  PT updated HEP and gave pt a HEP handout.  PT educated pt in correct form and appropriate frequency.       PATIENT EDUCATION:  Education details:  Educated pt in post op and protocol restrictions and expectations.  ROM, gait, HEP, exercise form, dx, prognosis, and POC.  PT answered pt's questions.  PT instructed pt to bring her cane in next visit.   Person educated: Patient Education method: Explanation, Demonstration, Tactile cues, Verbal cues, Education comprehension: verbalized understanding, returned demonstration, verbal cues required, and tactile cues required  HOME EXERCISE PROGRAM: Access Code: [email or web address] URL: [email or web address] Date: 12/12/2023 Prepared by: Mose Minerva  Exercises - Supine Quadricep Sets  - 2 x daily - 7 x weekly - 2 sets - 10 reps - 5 seconds hold - Supine Gluteal Sets  - 2 x daily - 7 x weekly - 2 sets - 10 reps - 5 seconds hold - Supine Ankle Pumps  - 7 x weekly - Longsitting Heel Slide  - 2-3 x daily - 7 x weekly - 1 sets - 10 reps -Sitting with knee flexed for 3-5 mins comfortably at various times  for 6-7 times/day - Supine Short Arc Quad  - 1 x daily - 7 x weekly - 2 sets - 10 reps - Supine Heel Slide with Strap  - 2 x daily - 7 x weekly - 1-2  sets - 10 reps - Long Sitting Calf Stretch with Strap  - 2 x daily - 7 x weekly - 2-3 reps - 20 seconds hold - Seated Long Arc Quad  - 1 x daily - 7 x weekly - 2 sets - 10 reps - Heel Raises with Counter Support  - 1 x daily - 7 x weekly - 2 sets - 10 reps - Supine Active Straight Leg Raise  - 1 x daily - 7 x weekly - 2 sets - 10 reps  Updated HEP: - Sidelying Hip Abduction  - 1 x daily - 7 x weekly - 2 sets - 10 reps - Standing Terminal Knee Extension with Resistance  - 1 x daily - 5-6 x weekly - 2 sets - 10 reps   ASSESSMENT:  CLINICAL IMPRESSION: Pt continues to progress well in all areas.  Pt ambulated well with SPC and had no instability or LOB.  She performed exercises per protocol well.  Pt performed fwd and lateral step ups well on a 4 inch step.  Pt was fatigued with S/L hip abduction.  She responded well to Rx having no c/o's after Rx.  Pt will benefit from cont skilled PT per protocol to address goals and impairments and improve overall function.      OBJECTIVE IMPAIRMENTS: Abnormal gait, decreased activity tolerance, decreased endurance, decreased knowledge of use of DME, decreased mobility, difficulty walking, decreased ROM, decreased strength, hypomobility, increased edema, impaired flexibility, and pain.   ACTIVITY LIMITATIONS: bending, sitting, standing, squatting, stairs, transfers, bed mobility, dressing, and locomotion level  PARTICIPATION LIMITATIONS: meal prep, cleaning, laundry, driving, shopping, and community activity  PERSONAL FACTORS: 1 comorbidity: Arthritis including in R knee are also affecting patient's functional outcome.   REHAB POTENTIAL: Good  CLINICAL DECISION MAKING: Stable/uncomplicated  EVALUATION COMPLEXITY: Low   GOALS:   SHORT TERM GOALS:  Pt will be independent and compliant with HEP for improved pain, ROM, strength, and function.  Baseline: Goal status: GOAL MET Target date:  01/01/24   2.  Pt will demo a good quad set and be able  to perform a supine SLR independently for improved strength and stability with gait.  Baseline:  Goal status: PROGRESSING Target date: 12/25/2023  3.  Pt will demo improved L knee flexion AROM to 95 deg for improved gait, stiffness, and mobility.  Baseline:  Goal status: GOAL MET 9/26 Target date:  01/08/2024  4.  Pt will demo improved quality of gait and ambulate with a SPC with good stability.  Baseline:  Goal status: INITIAL Target date:  01/08/2024   5.  Pt will be able to perform her normal transfers with no > than minimal difficulty.   Baseline:  Goal status: INITIAL    LONG TERM GOALS: Target date: 02/19/2024  Pt will demo improved R knee AROM to 0 - 115 deg for improved mobility and stiffness and for improved performance of stairs.  Baseline:  Goal status: GOAL MET  9/26  2.  Pt will ambulate with a normalized heel to toe gait without limping.  Baseline:  Goal status: INITIAL  3.   Pt will ambulate community distance without an AD without significant L knee pain and difficulty.  Baseline:  Goal status: INITIAL  4.  Pt will be able to perform household chores without significant L knee pain and difficulty.  Baseline:  Goal status: INITIAL  5.  Pt will demo improved strength to 4+/5 in R hip abd, 5/5 in Hip flexion, and 5/5 in knee ext and flexion for improved performance of daily functional mobility and to assist with returning to recreational activities.  Baseline:  Goal status: INITIAL  6.  Pt will be able to perform 4-5 steps with a reciprocal gait with the rail for improved performance of stairs.  Baseline:  Goal status: INITIAL   PLAN:  PT FREQUENCY: 2-3x/wk x 1-2 weeks and 2x/wk afterwards   PT DURATION: 7 weeks  PLANNED INTERVENTIONS: 97164- PT Re-evaluation, 97750- Physical Performance Testing, 97110-Therapeutic exercises, 97530- Therapeutic activity, 97112- Neuromuscular re-education, 97535- Self Care, 02859- Manual therapy, [phone]- Gait training,  [phone]- Aquatic Therapy, [phone]- Electrical stimulation (unattended), Patient/Family education, Balance training, Stair training, Taping, Joint mobilization, Spinal mobilization, Scar mobilization, DME instructions, Cryotherapy, and Moist heat  PLAN FOR NEXT SESSION: Cont per TKR protocol.  Gait training.       Leigh Minerva III PT, DPT 01/06/24 6:20 PM

## 2024-01-07 ENCOUNTER — Encounter (HOSPITAL_BASED_OUTPATIENT_CLINIC_OR_DEPARTMENT_OTHER): Payer: Self-pay | Admitting: Physical Therapy

## 2024-01-07 ENCOUNTER — Ambulatory Visit (HOSPITAL_BASED_OUTPATIENT_CLINIC_OR_DEPARTMENT_OTHER): Attending: Orthopedic Surgery | Admitting: Physical Therapy

## 2024-01-07 DIAGNOSIS — M25562 Pain in left knee: Secondary | ICD-10-CM | POA: Insufficient documentation

## 2024-01-07 DIAGNOSIS — M6281 Muscle weakness (generalized): Secondary | ICD-10-CM | POA: Insufficient documentation

## 2024-01-07 DIAGNOSIS — R262 Difficulty in walking, not elsewhere classified: Secondary | ICD-10-CM | POA: Diagnosis not present

## 2024-01-07 DIAGNOSIS — M25662 Stiffness of left knee, not elsewhere classified: Secondary | ICD-10-CM | POA: Diagnosis not present

## 2024-01-07 NOTE — Therapy (Signed)
 OUTPATIENT PHYSICAL THERAPY LOWER EXTREMITYTREATMENT   Patient Name: Laura Mills MRN: [id number] DOB:Jul 20, 1960, 63 y.o., female Today's Date: 01/08/2024  END OF SESSION:  PT End of Session - 01/07/24 1506     Visit Number 12    Number of Visits 22    Date for Recertification  02/19/24    Authorization Type AETNA    PT Start Time 1458    PT Stop Time 1537    PT Time Calculation (min) 39 min    Activity Tolerance Patient tolerated treatment well    Behavior During Therapy WFL for tasks assessed/performed                Past Medical History:  Diagnosis Date   Allergy    Arthritis    LUMBAR SPINE AND RIGHT HIP   Cataract    GERD (gastroesophageal reflux disease)    History of kidney stones 10/2012   Hyperlipidemia    Hypertension    Renal stone    Past Surgical History:  Procedure Laterality Date   ABDOMINAL HYSTERECTOMY  2000   CHOLECYSTECTOMY  04/09/2007   CYSTOSCOPY WITH URETEROSCOPY Right 10/02/2012   Procedure: CYSTOSCOPY WITH RIGHT RETROGRADE AND STENT PLACEMENT ;  Surgeon: Donnice Gwenyth Brooks, MD;  Location: [facility];  Service: Urology;  Laterality: Right;   CYSTOSCOPY WITH URETEROSCOPY Right 11/13/2012   Procedure: RIGHT URETEROSCOPY;  Surgeon: Donnice Gwenyth Brooks, MD;  Location: [facility];  Service: Urology;  Laterality: Right;  with Stent   CYSTOSCOPY/URETEROSCOPY/HOLMIUM LASER/STENT PLACEMENT Right 05/31/2022   Procedure: CYSTOSCOPY RIGHT URETEROSCOPY/HOLMIUM LASER/STENT PLACEMENT;  Surgeon: Brooks Donnice, MD;  Location: [facility];  Service: Urology;  Laterality: Right;  60 MINS   LITHOTRIPSY  10/06/2012   PUBOVAGINAL SLING N/A 04/15/2017   Procedure: CARLOYN GLADE;  Surgeon: Brooks Donnice, MD;  Location: [facility];  Service: Urology;  Laterality: N/A;  NEEDS 90 MIN FOR PROCEDURE   ROTATOR CUFF REPAIR Left    TYMPANOSTOMY TUBE PLACEMENT Right 2019   to clear up MRSA   There are no active problems to display for this  patient.   REFERRING PROVIDER: Beverley Evalene BIRCH, MD  REFERRING DIAG: L TKR  THERAPY DIAG:  Left knee pain, unspecified chronicity  Stiffness of left knee, not elsewhere classified  Muscle weakness (generalized)  Difficulty in walking, not elsewhere classified  Rationale for Evaluation and Treatment: Rehabilitation  ONSET DATE: DOS  12/04/2023  SUBJECTIVE:   SUBJECTIVE STATEMENT: Pt is 4 weeks and 6 days s/p L TKR.  Pt denies any adverse effects after prior treatment.  Pt feels stiff today.  Pt reports of soreness at incision.  Pt states her R knee is bothering her.  Pt states they came to pick up the CPM.        PERTINENT HISTORY: L TKR  12/04/2023 Severe arthritis R knee which pt receives injections, plan for R TKR in December Arthritis in lumbar and R hip also HTN  PAIN:  NPRS:  1-2/10 medial and lateral L knee.     PRECAUTIONS: per dx; R knee OA--planning for R knee TKR   WEIGHT BEARING RESTRICTIONS: FWB  FALLS:  Has patient fallen in last 6 months? Yes. Number of falls 2, R ankle rolled with new shoes ;  fell in the bathroom when the towel bar broke off the wall  LIVING ENVIRONMENT: Lives with: lives with their spouse Lives in: 1 story home Stairs: 3 steps to enter home with 1 rail Has following equipment at home: FWW, crutches, SPC,  3 pronged cane  OCCUPATION: part time home care, primarily sitting  PLOF: Independent  PATIENT GOALS: to improve pain and mobility   OBJECTIVE:  Note: Objective measures were completed at Evaluation unless otherwise noted.  DIAGNOSTIC FINDINGS: Pt is post op.    LOWER EXTREMITY ROM:   ROM Right eval Left eval Left 9/8 Left 9/12 Left 9/19 Left 9/26 AROM  Hip flexion        Hip extension        Hip abduction        Hip adduction        Hip internal rotation        Hip external rotation        Knee flexion  69/88 AROM/PROM 97 deg 107 deg  AAROM 119 AROM after PROM and AAROM 125  Knee extension  0 deg  AROM 0 deg AROM   0  Ankle dorsiflexion        Ankle plantarflexion        Ankle inversion        Ankle eversion         (Blank rows = not tested)                                                                                                                                TREATMENT:    Pt performed: Scifit bike x 5 mins full revolutions Supine SLR 2x10 S/L hip abduction x10 SAQ 2#  2x10 LAQ 2x10 Seated HS curls with YTB 3x10 Standing hip abduction with YTB above knees 2x10 L LE only 1/4 squats 2x10 with UE support on counter Step ups 6 inch step 2x10 with UE support Lateral step ups 2x10  4 inch step  Pt ambulated 300 ft with [facility]      PATIENT EDUCATION:  Education details:  Educated pt in post op and protocol restrictions and expectations.  ROM, gait, HEP, exercise form, dx, prognosis, and POC.  PT answered pt's questions.  Person educated: Patient Education method: Explanation, Demonstration, Tactile cues, Verbal cues, Education comprehension: verbalized understanding, returned demonstration, verbal cues required, and tactile cues required  HOME EXERCISE PROGRAM: Access Code: [email or web address] URL: [email or web address] Date: 12/12/2023 Prepared by: Mose Minerva  Exercises - Supine Quadricep Sets  - 2 x daily - 7 x weekly - 2 sets - 10 reps - 5 seconds hold - Supine Gluteal Sets  - 2 x daily - 7 x weekly - 2 sets - 10 reps - 5 seconds hold - Supine Ankle Pumps  - 7 x weekly - Longsitting Heel Slide  - 2-3 x daily - 7 x weekly - 1 sets - 10 reps -Sitting with knee flexed for 3-5 mins comfortably at various times for 6-7 times/day - Supine Short Arc Quad  - 1 x daily - 7 x weekly - 2 sets - 10 reps - Supine Heel Slide with Strap  - 2 x daily - 7 x weekly -  1-2 sets - 10 reps - Long Sitting Calf Stretch with Strap  - 2 x daily - 7 x weekly - 2-3 reps - 20 seconds hold - Seated Long Arc Quad  - 1 x daily - 7 x weekly - 2 sets - 10 reps - Heel Raises with Counter  Support  - 1 x daily - 7 x weekly - 2 sets - 10 reps - Supine Active Straight Leg Raise  - 1 x daily - 7 x weekly - 2 sets - 10 reps  Updated HEP: - Sidelying Hip Abduction  - 1 x daily - 7 x weekly - 2 sets - 10 reps - Standing Terminal Knee Extension with Resistance  - 1 x daily - 5-6 x weekly - 2 sets - 10 reps   ASSESSMENT:  CLINICAL IMPRESSION: Pt ambulates with a FWW and brought in her cane.  PT had pt use cane with ambulation in the clinic.  She ambulated well with SPC and had no instability or LOB.  Pt states her R knee is bothering her.  PT monitored R LE sx's t/o treatment.  She performed exercises per protocol well and PT focused on improving quad strength.  She continues to have an extensor lag with supine SLR.  PT increased height of fwd step ups to a 6 inch step and she performed step ups well.   She responded well to Rx having no c/o's after Rx.  Pt will benefit from cont skilled PT per protocol to address goals and impairments and improve overall function.          OBJECTIVE IMPAIRMENTS: Abnormal gait, decreased activity tolerance, decreased endurance, decreased knowledge of use of DME, decreased mobility, difficulty walking, decreased ROM, decreased strength, hypomobility, increased edema, impaired flexibility, and pain.   ACTIVITY LIMITATIONS: bending, sitting, standing, squatting, stairs, transfers, bed mobility, dressing, and locomotion level  PARTICIPATION LIMITATIONS: meal prep, cleaning, laundry, driving, shopping, and community activity  PERSONAL FACTORS: 1 comorbidity: Arthritis including in R knee are also affecting patient's functional outcome.   REHAB POTENTIAL: Good  CLINICAL DECISION MAKING: Stable/uncomplicated  EVALUATION COMPLEXITY: Low   GOALS:   SHORT TERM GOALS:  Pt will be independent and compliant with HEP for improved pain, ROM, strength, and function.  Baseline: Goal status: GOAL MET Target date:  01/01/24   2.  Pt will demo a good quad  set and be able to perform a supine SLR independently for improved strength and stability with gait.  Baseline:  Goal status: PROGRESSING Target date: 12/25/2023  3.  Pt will demo improved L knee flexion AROM to 95 deg for improved gait, stiffness, and mobility.  Baseline:  Goal status: GOAL MET 9/26 Target date:  01/08/2024  4.  Pt will demo improved quality of gait and ambulate with a SPC with good stability.  Baseline:  Goal status: INITIAL Target date:  01/08/2024   5.  Pt will be able to perform her normal transfers with no > than minimal difficulty.   Baseline:  Goal status: INITIAL    LONG TERM GOALS: Target date: 02/19/2024  Pt will demo improved R knee AROM to 0 - 115 deg for improved mobility and stiffness and for improved performance of stairs.  Baseline:  Goal status: GOAL MET  9/26  2.  Pt will ambulate with a normalized heel to toe gait without limping.  Baseline:  Goal status: INITIAL  3.   Pt will ambulate community distance without an AD without significant L knee pain  and difficulty.  Baseline:  Goal status: INITIAL  4.   Pt will be able to perform household chores without significant L knee pain and difficulty.  Baseline:  Goal status: INITIAL  5.  Pt will demo improved strength to 4+/5 in R hip abd, 5/5 in Hip flexion, and 5/5 in knee ext and flexion for improved performance of daily functional mobility and to assist with returning to recreational activities.  Baseline:  Goal status: INITIAL  6.  Pt will be able to perform 4-5 steps with a reciprocal gait with the rail for improved performance of stairs.  Baseline:  Goal status: INITIAL   PLAN:  PT FREQUENCY: 2-3x/wk x 1-2 weeks and 2x/wk afterwards   PT DURATION: 7 weeks  PLANNED INTERVENTIONS: 97164- PT Re-evaluation, 97750- Physical Performance Testing, 97110-Therapeutic exercises, 97530- Therapeutic activity, 97112- Neuromuscular re-education, 97535- Self Care, 02859- Manual therapy, [phone]-  Gait training, [phone]- Aquatic Therapy, [phone]- Electrical stimulation (unattended), Patient/Family education, Balance training, Stair training, Taping, Joint mobilization, Spinal mobilization, Scar mobilization, DME instructions, Cryotherapy, and Moist heat  PLAN FOR NEXT SESSION: Cont per TKR protocol.  Gait training.       Leigh Minerva III PT, DPT 01/08/24 11:14 PM

## 2024-01-08 ENCOUNTER — Encounter (HOSPITAL_BASED_OUTPATIENT_CLINIC_OR_DEPARTMENT_OTHER): Payer: Self-pay | Admitting: Physical Therapy

## 2024-01-09 ENCOUNTER — Encounter (HOSPITAL_BASED_OUTPATIENT_CLINIC_OR_DEPARTMENT_OTHER): Payer: Self-pay | Admitting: Physical Therapy

## 2024-01-09 ENCOUNTER — Ambulatory Visit (HOSPITAL_BASED_OUTPATIENT_CLINIC_OR_DEPARTMENT_OTHER): Admitting: Physical Therapy

## 2024-01-09 DIAGNOSIS — M25662 Stiffness of left knee, not elsewhere classified: Secondary | ICD-10-CM | POA: Diagnosis not present

## 2024-01-09 DIAGNOSIS — M6281 Muscle weakness (generalized): Secondary | ICD-10-CM

## 2024-01-09 DIAGNOSIS — R262 Difficulty in walking, not elsewhere classified: Secondary | ICD-10-CM

## 2024-01-09 DIAGNOSIS — M25562 Pain in left knee: Secondary | ICD-10-CM

## 2024-01-09 NOTE — Therapy (Signed)
 OUTPATIENT PHYSICAL THERAPY LOWER EXTREMITYTREATMENT   Patient Name: Laura Mills MRN: [id number] DOB:Jan 15, 1961, 63 y.o., female Today's Date: 01/09/2024  END OF SESSION:  PT End of Session - 01/09/24 0859     Visit Number 13    Number of Visits 22    Date for Recertification  02/19/24    Authorization Type AETNA    PT Start Time 0856   Pt arrived 11 mins late to treatment.   PT Stop Time 0935    PT Time Calculation (min) 39 min    Activity Tolerance Patient tolerated treatment well    Behavior During Therapy WFL for tasks assessed/performed                Past Medical History:  Diagnosis Date   Allergy    Arthritis    LUMBAR SPINE AND RIGHT HIP   Cataract    GERD (gastroesophageal reflux disease)    History of kidney stones 10/2012   Hyperlipidemia    Hypertension    Renal stone    Past Surgical History:  Procedure Laterality Date   ABDOMINAL HYSTERECTOMY  2000   CHOLECYSTECTOMY  04/09/2007   CYSTOSCOPY WITH URETEROSCOPY Right 10/02/2012   Procedure: CYSTOSCOPY WITH RIGHT RETROGRADE AND STENT PLACEMENT ;  Surgeon: Donnice Gwenyth Brooks, MD;  Location: [facility];  Service: Urology;  Laterality: Right;   CYSTOSCOPY WITH URETEROSCOPY Right 11/13/2012   Procedure: RIGHT URETEROSCOPY;  Surgeon: Donnice Gwenyth Brooks, MD;  Location: [facility];  Service: Urology;  Laterality: Right;  with Stent   CYSTOSCOPY/URETEROSCOPY/HOLMIUM LASER/STENT PLACEMENT Right 05/31/2022   Procedure: CYSTOSCOPY RIGHT URETEROSCOPY/HOLMIUM LASER/STENT PLACEMENT;  Surgeon: Brooks Donnice, MD;  Location: [facility];  Service: Urology;  Laterality: Right;  60 MINS   LITHOTRIPSY  10/06/2012   PUBOVAGINAL SLING N/A 04/15/2017   Procedure: CARLOYN GLADE;  Surgeon: Brooks Donnice, MD;  Location: [facility];  Service: Urology;  Laterality: N/A;  NEEDS 90 MIN FOR PROCEDURE   ROTATOR CUFF REPAIR Left    TYMPANOSTOMY TUBE PLACEMENT Right 2019   to clear up MRSA   There are no active problems to  display for this patient.   REFERRING PROVIDER: Beverley Evalene BIRCH, MD  REFERRING DIAG: L TKR  THERAPY DIAG:  Left knee pain, unspecified chronicity  Muscle weakness (generalized)  Difficulty in walking, not elsewhere classified  Stiffness of left knee, not elsewhere classified  Rationale for Evaluation and Treatment: Rehabilitation  ONSET DATE: DOS  12/04/2023  SUBJECTIVE:   SUBJECTIVE STATEMENT: Pt is 5 weeks and 1 day s/p L TKR.  Pt states she had pain Wednesday night after treatment.  Pt walked around [facility] pushing a cart and had significant fatigue and pain.  She reports having an 8/10 pain while walking in [place].  Pt states she is walking in home without AD.     PERTINENT HISTORY: L TKR  12/04/2023 Severe arthritis R knee which pt receives injections, plan for R TKR in December Arthritis in lumbar and R hip also HTN  PAIN:  NPRS:  1/10 L knee.     PRECAUTIONS: per dx; R knee OA--planning for R knee TKR   WEIGHT BEARING RESTRICTIONS: FWB  FALLS:  Has patient fallen in last 6 months? Yes. Number of falls 2, R ankle rolled with new shoes ;  fell in the bathroom when the towel bar broke off the wall  LIVING ENVIRONMENT: Lives with: lives with their spouse Lives in: 1 story home Stairs: 3 steps to enter home with 1  rail Has following equipment at home: FWW, crutches, SPC, 3 pronged cane  OCCUPATION: part time home care, primarily sitting  PLOF: Independent  PATIENT GOALS: to improve pain and mobility   OBJECTIVE:  Note: Objective measures were completed at Evaluation unless otherwise noted.  DIAGNOSTIC FINDINGS: Pt is post op.    LOWER EXTREMITY ROM:   ROM Right eval Left eval Left 9/8 Left 9/12 Left 9/19 Left 9/26 AROM Left 130  Hip flexion         Hip extension         Hip abduction         Hip adduction         Hip internal rotation         Hip external rotation         Knee flexion  69/88 AROM/PROM 97 deg 107 deg  AAROM  119 AROM after PROM and AAROM 125 130  Knee extension  0 deg AROM 0 deg AROM   0   Ankle dorsiflexion         Ankle plantarflexion         Ankle inversion         Ankle eversion          (Blank rows = not tested)                                                                                                                                TREATMENT:    Pt performed: Scifit bike x 5 mins full revolutions Supine SLR 2x10 SAQ 2#  2x10 LAQ 2x10  1#  2x10 Seated HS curls with YTB 3x12 Standing hip abduction with YTB above knees 2x10 L LE only Step ups 6 inch step 2x10 with UE support Lateral step ups 2x10  4 inch step  Pt ambulated 300 ft with [facility]     PATIENT EDUCATION:  Education details:  Educated pt in post op and protocol restrictions and expectations.  ROM, gait, HEP, exercise form, dx, prognosis, and POC.  PT answered pt's questions.  Person educated: Patient Education method: Explanation, Demonstration, Tactile cues, Verbal cues, Education comprehension: verbalized understanding, returned demonstration, verbal cues required, and tactile cues required  HOME EXERCISE PROGRAM: Access Code: [email or web address] URL: [email or web address] Date: 12/12/2023 Prepared by: Mose Minerva  Exercises - Supine Quadricep Sets  - 2 x daily - 7 x weekly - 2 sets - 10 reps - 5 seconds hold - Supine Gluteal Sets  - 2 x daily - 7 x weekly - 2 sets - 10 reps - 5 seconds hold - Supine Ankle Pumps  - 7 x weekly - Longsitting Heel Slide  - 2-3 x daily - 7 x weekly - 1 sets - 10 reps -Sitting with knee flexed for 3-5 mins comfortably at various times for 6-7 times/day - Supine Short Arc Quad  - 1 x daily - 7 x weekly - 2 sets - 10 reps -  Supine Heel Slide with Strap  - 2 x daily - 7 x weekly - 1-2 sets - 10 reps - Long Sitting Calf Stretch with Strap  - 2 x daily - 7 x weekly - 2-3 reps - 20 seconds hold - Seated Long Arc Quad  - 1 x daily - 7 x weekly - 2 sets - 10 reps - Heel Raises with  Counter Support  - 1 x daily - 7 x weekly - 2 sets - 10 reps - Supine Active Straight Leg Raise  - 1 x daily - 7 x weekly - 2 sets - 10 reps - Sidelying Hip Abduction  - 1 x daily - 7 x weekly - 2 sets - 10 reps - Standing Terminal Knee Extension with Resistance  - 1 x daily - 5-6 x weekly - 2 sets - 10 reps   ASSESSMENT:  CLINICAL IMPRESSION: Pt is 5 weeks and 1 day s/p L TKR making excellent progress.  PT assessed knee AROM and she has excellent knee ROM.  Pt entered clinic with FWW and ambulated with PT with SPC.  She had good stability with SPC in the clinic.  Pt reports having significant pain and fatigue when ambulating in [place] with shopping cart.  Pt is improving with quad strength.  She continues to have a quad extensor lag with supine SLR though is improving.  Pt tolerated exercises well and is progressing with protocol.  Pt had no c/o's after treatment.  Pt will benefit from cont skilled PT per protocol to address goals and impairments and improve overall function.        OBJECTIVE IMPAIRMENTS: Abnormal gait, decreased activity tolerance, decreased endurance, decreased knowledge of use of DME, decreased mobility, difficulty walking, decreased ROM, decreased strength, hypomobility, increased edema, impaired flexibility, and pain.   ACTIVITY LIMITATIONS: bending, sitting, standing, squatting, stairs, transfers, bed mobility, dressing, and locomotion level  PARTICIPATION LIMITATIONS: meal prep, cleaning, laundry, driving, shopping, and community activity  PERSONAL FACTORS: 1 comorbidity: Arthritis including in R knee are also affecting patient's functional outcome.   REHAB POTENTIAL: Good  CLINICAL DECISION MAKING: Stable/uncomplicated  EVALUATION COMPLEXITY: Low   GOALS:   SHORT TERM GOALS:  Pt will be independent and compliant with HEP for improved pain, ROM, strength, and function.  Baseline: Goal status: GOAL MET Target date:  01/01/24   2.  Pt will demo a good  quad set and be able to perform a supine SLR independently for improved strength and stability with gait.  Baseline:  Goal status: PROGRESSING Target date: 12/25/2023  3.  Pt will demo improved L knee flexion AROM to 95 deg for improved gait, stiffness, and mobility.  Baseline:  Goal status: GOAL MET 9/26 Target date:  01/08/2024  4.  Pt will demo improved quality of gait and ambulate with a SPC with good stability.  Baseline:  Goal status: INITIAL Target date:  01/08/2024   5.  Pt will be able to perform her normal transfers with no > than minimal difficulty.   Baseline:  Goal status: INITIAL    LONG TERM GOALS: Target date: 02/19/2024  Pt will demo improved R knee AROM to 0 - 115 deg for improved mobility and stiffness and for improved performance of stairs.  Baseline:  Goal status: GOAL MET  9/26  2.  Pt will ambulate with a normalized heel to toe gait without limping.  Baseline:  Goal status: INITIAL  3.   Pt will ambulate community distance without an AD  without significant L knee pain and difficulty.  Baseline:  Goal status: INITIAL  4.   Pt will be able to perform household chores without significant L knee pain and difficulty.  Baseline:  Goal status: INITIAL  5.  Pt will demo improved strength to 4+/5 in R hip abd, 5/5 in Hip flexion, and 5/5 in knee ext and flexion for improved performance of daily functional mobility and to assist with returning to recreational activities.  Baseline:  Goal status: INITIAL  6.  Pt will be able to perform 4-5 steps with a reciprocal gait with the rail for improved performance of stairs.  Baseline:  Goal status: INITIAL   PLAN:  PT FREQUENCY: 2-3x/wk x 1-2 weeks and 2x/wk afterwards   PT DURATION: 7 weeks  PLANNED INTERVENTIONS: 97164- PT Re-evaluation, 97750- Physical Performance Testing, 97110-Therapeutic exercises, 97530- Therapeutic activity, 97112- Neuromuscular re-education, 97535- Self Care, 02859- Manual therapy,  [phone]- Gait training, [phone]- Aquatic Therapy, [phone]- Electrical stimulation (unattended), Patient/Family education, Balance training, Stair training, Taping, Joint mobilization, Spinal mobilization, Scar mobilization, DME instructions, Cryotherapy, and Moist heat  PLAN FOR NEXT SESSION: Cont per TKR protocol.  Gait training.       Leigh Minerva III PT, DPT 01/09/24 5:31 PM

## 2024-01-12 ENCOUNTER — Encounter (HOSPITAL_BASED_OUTPATIENT_CLINIC_OR_DEPARTMENT_OTHER): Payer: Self-pay | Admitting: Physical Therapy

## 2024-01-12 ENCOUNTER — Ambulatory Visit (HOSPITAL_BASED_OUTPATIENT_CLINIC_OR_DEPARTMENT_OTHER): Admitting: Physical Therapy

## 2024-01-12 DIAGNOSIS — M6281 Muscle weakness (generalized): Secondary | ICD-10-CM | POA: Diagnosis not present

## 2024-01-12 DIAGNOSIS — M25662 Stiffness of left knee, not elsewhere classified: Secondary | ICD-10-CM | POA: Diagnosis not present

## 2024-01-12 DIAGNOSIS — M25562 Pain in left knee: Secondary | ICD-10-CM | POA: Diagnosis not present

## 2024-01-12 DIAGNOSIS — R262 Difficulty in walking, not elsewhere classified: Secondary | ICD-10-CM

## 2024-01-12 NOTE — Therapy (Signed)
 OUTPATIENT PHYSICAL THERAPY LOWER EXTREMITYTREATMENT   Patient Name: Laura Mills MRN: [id number] DOB:February 08, 1961, 63 y.o., female Today's Date: 01/13/2024  END OF SESSION:  PT End of Session - 01/12/24 1500     Visit Number 14    Number of Visits 22    Date for Recertification  02/19/24    Authorization Type AETNA    PT Start Time 1454    PT Stop Time 1538    PT Time Calculation (min) 44 min    Activity Tolerance Patient tolerated treatment well    Behavior During Therapy WFL for tasks assessed/performed                Past Medical History:  Diagnosis Date   Allergy    Arthritis    LUMBAR SPINE AND RIGHT HIP   Cataract    GERD (gastroesophageal reflux disease)    History of kidney stones 10/2012   Hyperlipidemia    Hypertension    Renal stone    Past Surgical History:  Procedure Laterality Date   ABDOMINAL HYSTERECTOMY  2000   CHOLECYSTECTOMY  04/09/2007   CYSTOSCOPY WITH URETEROSCOPY Right 10/02/2012   Procedure: CYSTOSCOPY WITH RIGHT RETROGRADE AND STENT PLACEMENT ;  Surgeon: Donnice Gwenyth Brooks, MD;  Location: [facility];  Service: Urology;  Laterality: Right;   CYSTOSCOPY WITH URETEROSCOPY Right 11/13/2012   Procedure: RIGHT URETEROSCOPY;  Surgeon: Donnice Gwenyth Brooks, MD;  Location: [facility];  Service: Urology;  Laterality: Right;  with Stent   CYSTOSCOPY/URETEROSCOPY/HOLMIUM LASER/STENT PLACEMENT Right 05/31/2022   Procedure: CYSTOSCOPY RIGHT URETEROSCOPY/HOLMIUM LASER/STENT PLACEMENT;  Surgeon: Brooks Donnice, MD;  Location: [facility];  Service: Urology;  Laterality: Right;  60 MINS   LITHOTRIPSY  10/06/2012   PUBOVAGINAL SLING N/A 04/15/2017   Procedure: CARLOYN GLADE;  Surgeon: Brooks Donnice, MD;  Location: [facility];  Service: Urology;  Laterality: N/A;  NEEDS 90 MIN FOR PROCEDURE   ROTATOR CUFF REPAIR Left    TYMPANOSTOMY TUBE PLACEMENT Right 2019   to clear up MRSA   There are no active problems to display for this  patient.   REFERRING PROVIDER: Beverley Evalene BIRCH, MD  REFERRING DIAG: L TKR  THERAPY DIAG:  Left knee pain, unspecified chronicity  Muscle weakness (generalized)  Difficulty in walking, not elsewhere classified  Stiffness of left knee, not elsewhere classified  Rationale for Evaluation and Treatment: Rehabilitation  ONSET DATE: DOS  12/04/2023  SUBJECTIVE:   SUBJECTIVE STATEMENT: Pt is 5 weeks and 4 days s/p L TKR.  Pt states she has stopped taking her aspirin.  Pt called MD to ask about aspirin and driving.  Nurse informed she could stop taking aspirin and start driving.  Pt denies any adverse effects after prior treatment.  Pt was able to go to church on Sunday and reports no increased pain.  Pt used her walker at church.  Pt doesn't use an AD at home much.  Pt reports having a tight feeling in posterior knee at times.       PERTINENT HISTORY: L TKR  12/04/2023 Severe arthritis R knee which pt receives injections, plan for R TKR in December Arthritis in lumbar and R hip also HTN  PAIN:  NPRS:  0/10 L knee.     PRECAUTIONS: per dx; R knee OA--planning for R knee TKR   WEIGHT BEARING RESTRICTIONS: FWB  FALLS:  Has patient fallen in last 6 months? Yes. Number of falls 2, R ankle rolled with new shoes ;  fell in the bathroom when  the towel bar broke off the wall  LIVING ENVIRONMENT: Lives with: lives with their spouse Lives in: 1 story home Stairs: 3 steps to enter home with 1 rail Has following equipment at home: FWW, crutches, SPC, 3 pronged cane  OCCUPATION: part time home care, primarily sitting  PLOF: Independent  PATIENT GOALS: to improve pain and mobility   OBJECTIVE:  Note: Objective measures were completed at Evaluation unless otherwise noted.  DIAGNOSTIC FINDINGS: Pt is post op.    LOWER EXTREMITY ROM:   ROM Right eval Left eval Left 9/8 Left 9/12 Left 9/19 Left 9/26 AROM Left 10/3 AROM  Hip flexion         Hip extension          Hip abduction         Hip adduction         Hip internal rotation         Hip external rotation         Knee flexion  69/88 AROM/PROM 97 deg 107 deg  AAROM 119 AROM after PROM and AAROM 125 130  Knee extension  0 deg AROM 0 deg AROM   0   Ankle dorsiflexion         Ankle plantarflexion         Ankle inversion         Ankle eversion          (Blank rows = not tested)                                                                                                                                TREATMENT:    Pt performed: Scifit bike x 5 mins full revolutions Supine SLR 2x10 SAQ 2.5#  3x10 LAQ x10 1#,  2x10  2# Seated HS curls with RTB 3x10 Standing hip abduction with YTB above knees 2x10 L LE, 1x10 R LE Standing hip extension with YTB x 10 reps L LE only Step ups 6 inch step 2x10 with UE support Lateral step ups 2x10  4 inch step  Pt ambulated 600 ft with SPC.  Pt also ambuated in the hallway without AD without any LOB.     PATIENT EDUCATION:  Education details:  Educated pt in post op and protocol restrictions and expectations.  ROM, gait, HEP, exercise form, dx, prognosis, and POC.  PT answered pt's questions.  Increasing usage of cane and decreasing usage of walker.  Person educated: Patient Education method: Explanation, Demonstration, Tactile cues, Verbal cues, Education comprehension: verbalized understanding, returned demonstration, verbal cues required, and tactile cues required  HOME EXERCISE PROGRAM: Access Code: [email or web address] URL: [email or web address] Date: 12/12/2023 Prepared by: Mose Minerva  Exercises - Supine Quadricep Sets  - 2 x daily - 7 x weekly - 2 sets - 10 reps - 5 seconds hold - Supine Gluteal Sets  - 2 x daily - 7 x weekly - 2 sets - 10 reps -  5 seconds hold - Supine Ankle Pumps  - 7 x weekly - Longsitting Heel Slide  - 2-3 x daily - 7 x weekly - 1 sets - 10 reps -Sitting with knee flexed for 3-5 mins comfortably at various times for  6-7 times/day - Supine Short Arc Quad  - 1 x daily - 7 x weekly - 2 sets - 10 reps - Supine Heel Slide with Strap  - 2 x daily - 7 x weekly - 1-2 sets - 10 reps - Long Sitting Calf Stretch with Strap  - 2 x daily - 7 x weekly - 2-3 reps - 20 seconds hold - Seated Long Arc Quad  - 1 x daily - 7 x weekly - 2 sets - 10 reps - Heel Raises with Counter Support  - 1 x daily - 7 x weekly - 2 sets - 10 reps - Supine Active Straight Leg Raise  - 1 x daily - 7 x weekly - 2 sets - 10 reps - Sidelying Hip Abduction  - 1 x daily - 7 x weekly - 2 sets - 10 reps - Standing Terminal Knee Extension with Resistance  - 1 x daily - 5-6 x weekly - 2 sets - 10 reps   ASSESSMENT:  CLINICAL IMPRESSION: Pt is 5 weeks and 4 days s/p L TKR making excellent progress.  Pt is increasing ambulation distance as evidenced by getting out more in the community including going to church.  She is improving with L LE strength as evidenced by performance of exercises.  PT gave cues for pt to focus on maintaining quad contraction with supine SLR to decrease extensor lag.  Pt demonstrates improved extensor lag and improved form on the 2nd set of SLR's.  Pt ambulated 2 laps in the clinic with her SPC with good stability and without LOB.  Pt ambulated with cane well.  She also ambulated a short distance in the hallway without an AD without any instability.  Pt is ambulating in home without AD.  PT instructed pt to use her SPC more and her walker less though still needs to have her walker for long distance.  PT instructed pt and husband and they both demonstrated good understanding.  Pt responded well to treatment having no c/o's after treatment.          OBJECTIVE IMPAIRMENTS: Abnormal gait, decreased activity tolerance, decreased endurance, decreased knowledge of use of DME, decreased mobility, difficulty walking, decreased ROM, decreased strength, hypomobility, increased edema, impaired flexibility, and pain.   ACTIVITY LIMITATIONS:  bending, sitting, standing, squatting, stairs, transfers, bed mobility, dressing, and locomotion level  PARTICIPATION LIMITATIONS: meal prep, cleaning, laundry, driving, shopping, and community activity  PERSONAL FACTORS: 1 comorbidity: Arthritis including in R knee are also affecting patient's functional outcome.   REHAB POTENTIAL: Good  CLINICAL DECISION MAKING: Stable/uncomplicated  EVALUATION COMPLEXITY: Low   GOALS:   SHORT TERM GOALS:  Pt will be independent and compliant with HEP for improved pain, ROM, strength, and function.  Baseline: Goal status: GOAL MET Target date:  01/01/24   2.  Pt will demo a good quad set and be able to perform a supine SLR independently for improved strength and stability with gait.  Baseline:  Goal status: PROGRESSING Target date: 12/25/2023  3.  Pt will demo improved L knee flexion AROM to 95 deg for improved gait, stiffness, and mobility.  Baseline:  Goal status: GOAL MET 9/26 Target date:  01/08/2024  4.  Pt will demo improved quality  of gait and ambulate with a SPC with good stability.  Baseline:  Goal status: INITIAL Target date:  01/08/2024   5.  Pt will be able to perform her normal transfers with no > than minimal difficulty.   Baseline:  Goal status: INITIAL    LONG TERM GOALS: Target date: 02/19/2024  Pt will demo improved R knee AROM to 0 - 115 deg for improved mobility and stiffness and for improved performance of stairs.  Baseline:  Goal status: GOAL MET  9/26  2.  Pt will ambulate with a normalized heel to toe gait without limping.  Baseline:  Goal status: INITIAL  3.   Pt will ambulate community distance without an AD without significant L knee pain and difficulty.  Baseline:  Goal status: INITIAL  4.   Pt will be able to perform household chores without significant L knee pain and difficulty.  Baseline:  Goal status: INITIAL  5.  Pt will demo improved strength to 4+/5 in R hip abd, 5/5 in Hip flexion, and  5/5 in knee ext and flexion for improved performance of daily functional mobility and to assist with returning to recreational activities.  Baseline:  Goal status: INITIAL  6.  Pt will be able to perform 4-5 steps with a reciprocal gait with the rail for improved performance of stairs.  Baseline:  Goal status: INITIAL   PLAN:  PT FREQUENCY: 2-3x/wk x 1-2 weeks and 2x/wk afterwards   PT DURATION: 7 weeks  PLANNED INTERVENTIONS: 97164- PT Re-evaluation, 97750- Physical Performance Testing, 97110-Therapeutic exercises, 97530- Therapeutic activity, 97112- Neuromuscular re-education, 97535- Self Care, 02859- Manual therapy, [phone]- Gait training, [phone]- Aquatic Therapy, [phone]- Electrical stimulation (unattended), Patient/Family education, Balance training, Stair training, Taping, Joint mobilization, Spinal mobilization, Scar mobilization, DME instructions, Cryotherapy, and Moist heat  PLAN FOR NEXT SESSION: Cont per TKR protocol.  Gait training.       Leigh Minerva III PT, DPT 01/13/24 11:16 PM

## 2024-01-15 ENCOUNTER — Encounter (HOSPITAL_BASED_OUTPATIENT_CLINIC_OR_DEPARTMENT_OTHER): Payer: Self-pay | Admitting: Physical Therapy

## 2024-01-15 ENCOUNTER — Ambulatory Visit (HOSPITAL_BASED_OUTPATIENT_CLINIC_OR_DEPARTMENT_OTHER): Admitting: Physical Therapy

## 2024-01-15 DIAGNOSIS — M25562 Pain in left knee: Secondary | ICD-10-CM | POA: Diagnosis not present

## 2024-01-15 DIAGNOSIS — M25662 Stiffness of left knee, not elsewhere classified: Secondary | ICD-10-CM | POA: Diagnosis not present

## 2024-01-15 DIAGNOSIS — M6281 Muscle weakness (generalized): Secondary | ICD-10-CM | POA: Diagnosis not present

## 2024-01-15 DIAGNOSIS — R262 Difficulty in walking, not elsewhere classified: Secondary | ICD-10-CM | POA: Diagnosis not present

## 2024-01-15 NOTE — Therapy (Signed)
 OUTPATIENT PHYSICAL THERAPY LOWER EXTREMITYTREATMENT   Patient Name: Laura Mills MRN: [id number] DOB:09/30/1960, 63 y.o., female Today's Date: 01/15/2024  END OF SESSION:  PT End of Session - 01/15/24 1516     Visit Number 15    Number of Visits 22    Date for Recertification  02/19/24    Authorization Type AETNA    PT Start Time 1503                Past Medical History:  Diagnosis Date   Allergy    Arthritis    LUMBAR SPINE AND RIGHT HIP   Cataract    GERD (gastroesophageal reflux disease)    History of kidney stones 10/2012   Hyperlipidemia    Hypertension    Renal stone    Past Surgical History:  Procedure Laterality Date   ABDOMINAL HYSTERECTOMY  2000   CHOLECYSTECTOMY  04/09/2007   CYSTOSCOPY WITH URETEROSCOPY Right 10/02/2012   Procedure: CYSTOSCOPY WITH RIGHT RETROGRADE AND STENT PLACEMENT ;  Surgeon: Donnice Gwenyth Brooks, MD;  Location: [facility];  Service: Urology;  Laterality: Right;   CYSTOSCOPY WITH URETEROSCOPY Right 11/13/2012   Procedure: RIGHT URETEROSCOPY;  Surgeon: Donnice Gwenyth Brooks, MD;  Location: [facility];  Service: Urology;  Laterality: Right;  with Stent   CYSTOSCOPY/URETEROSCOPY/HOLMIUM LASER/STENT PLACEMENT Right 05/31/2022   Procedure: CYSTOSCOPY RIGHT URETEROSCOPY/HOLMIUM LASER/STENT PLACEMENT;  Surgeon: Brooks Donnice, MD;  Location: [facility];  Service: Urology;  Laterality: Right;  60 MINS   LITHOTRIPSY  10/06/2012   PUBOVAGINAL SLING N/A 04/15/2017   Procedure: CARLOYN GLADE;  Surgeon: Brooks Donnice, MD;  Location: [facility];  Service: Urology;  Laterality: N/A;  NEEDS 90 MIN FOR PROCEDURE   ROTATOR CUFF REPAIR Left    TYMPANOSTOMY TUBE PLACEMENT Right 2019   to clear up MRSA   There are no active problems to display for this patient.   REFERRING PROVIDER: Beverley Evalene BIRCH, MD  REFERRING DIAG: L TKR  THERAPY DIAG:  Left knee pain, unspecified chronicity  Muscle weakness (generalized)  Difficulty in walking,  not elsewhere classified  Stiffness of left knee, not elsewhere classified  Rationale for Evaluation and Treatment: Rehabilitation  ONSET DATE: DOS  12/04/2023  SUBJECTIVE:   SUBJECTIVE STATEMENT: Pt is 6 weeks s/p L TKR.  Pt denies any adverse effects after prior treatment.  Pt went to [employer] yesterday.  She walked pushing cart in the stores.  She reports no increased pain though was fatigued.  Pt states she has noticed some LBP located at PSIS ares on L side recently which she has had in the past.  Pt denies knee pain currently though states it's tender in medial side.  Pt states her R knee has been bothering her more than her L knee.  Pt has been driving.        PERTINENT HISTORY: L TKR  12/04/2023 Severe arthritis R knee which pt receives injections, plan for R TKR in December Arthritis in lumbar and R hip also HTN  PAIN:  NPRS:  0/10 L knee.     PRECAUTIONS: per dx; R knee OA--planning for R knee TKR   WEIGHT BEARING RESTRICTIONS: FWB  FALLS:  Has patient fallen in last 6 months? Yes. Number of falls 2, R ankle rolled with new shoes ;  fell in the bathroom when the towel bar broke off the wall  LIVING ENVIRONMENT: Lives with: lives with their spouse Lives in: 1 story home Stairs: 3 steps to enter home with  1 rail Has following equipment at home: FWW, crutches, SPC, 3 pronged cane  OCCUPATION: part time home care, primarily sitting  PLOF: Independent  PATIENT GOALS: to improve pain and mobility   OBJECTIVE:  Note: Objective measures were completed at Evaluation unless otherwise noted.  DIAGNOSTIC FINDINGS: Pt is post op.    LOWER EXTREMITY ROM:   ROM Right eval Left eval Left 9/8 Left 9/12 Left 9/19 Left 9/26 AROM Left 10/3 AROM  Hip flexion         Hip extension         Hip abduction         Hip adduction         Hip internal rotation         Hip external rotation         Knee flexion  69/88 AROM/PROM 97 deg 107 deg  AAROM 119  AROM after PROM and AAROM 125 130  Knee extension  0 deg AROM 0 deg AROM   0   Ankle dorsiflexion         Ankle plantarflexion         Ankle inversion         Ankle eversion          (Blank rows = not tested)                                                                                                                                TREATMENT:    Pt performed: Scifit bike x 5 mins full revolutions Supine SLR 2x10 SAQ 2.5#  3x10 LAQ 2#  3x10 Seated HS curls with RTB 3x10 Standing hip abduction with YTB above knees 2x10 L LE, 1x10 R LE Standing hip extension with YTB x 10 reps bilat LE Step ups 6 inch step 2x10 with UE support Lateral step ups x10 inch 4 inch, 2x10  6 inch step TKE GTB 2x10     PATIENT EDUCATION:  Education details:  Educated pt in post op and protocol restrictions and expectations.  ROM, gait, HEP, exercise form, dx, prognosis, and POC.  PT answered pt's questions.  Increasing usage of cane and decreasing usage of walker.  Person educated: Patient Education method: Explanation, Demonstration, Tactile cues, Verbal cues, Education comprehension: verbalized understanding, returned demonstration, verbal cues required, and tactile cues required  HOME EXERCISE PROGRAM: Access Code: [email or web address] URL: [email or web address] Date: 12/12/2023 Prepared by: Mose Minerva  Exercises - Supine Quadricep Sets  - 2 x daily - 7 x weekly - 2 sets - 10 reps - 5 seconds hold - Supine Gluteal Sets  - 2 x daily - 7 x weekly - 2 sets - 10 reps - 5 seconds hold - Supine Ankle Pumps  - 7 x weekly - Longsitting Heel Slide  - 2-3 x daily - 7 x weekly - 1 sets - 10 reps -Sitting with knee flexed for 3-5 mins comfortably at various times for 6-7  times/day - Supine Short Arc Quad  - 1 x daily - 7 x weekly - 2 sets - 10 reps - Supine Heel Slide with Strap  - 2 x daily - 7 x weekly - 1-2 sets - 10 reps - Long Sitting Calf Stretch with Strap  - 2 x daily - 7 x weekly - 2-3 reps -  20 seconds hold - Seated Long Arc Quad  - 1 x daily - 7 x weekly - 2 sets - 10 reps - Heel Raises with Counter Support  - 1 x daily - 7 x weekly - 2 sets - 10 reps - Supine Active Straight Leg Raise  - 1 x daily - 7 x weekly - 2 sets - 10 reps - Sidelying Hip Abduction  - 1 x daily - 7 x weekly - 2 sets - 10 reps - Standing Terminal Knee Extension with Resistance  - 1 x daily - 5-6 x weekly - 2 sets - 10 reps   ASSESSMENT:  CLINICAL IMPRESSION: Pt is 6 weeks s/p L TKR making excellent progress.  Pt is increasing ambulation distance as evidenced by getting out more in the community including going to church.  She is improving with L LE strength as evidenced by performance of exercises.  PT gave cues for pt to focus on maintaining quad contraction with supine SLR to decrease extensor lag.  Pt demonstrates improved extensor lag and improved form on the 2nd set of SLR's.  Pt ambulated 2 laps in the clinic with her SPC with good stability and without LOB.  Pt ambulated with cane well.  She also ambulated a short distance in the hallway without an AD without any instability.  Pt is ambulating in home without AD.  PT instructed pt to use her SPC more and her walker less though still needs to have her walker for long distance.  PT instructed pt and husband and they both demonstrated good understanding.  Pt responded well to treatment having no c/o's after treatment.          OBJECTIVE IMPAIRMENTS: Abnormal gait, decreased activity tolerance, decreased endurance, decreased knowledge of use of DME, decreased mobility, difficulty walking, decreased ROM, decreased strength, hypomobility, increased edema, impaired flexibility, and pain.   ACTIVITY LIMITATIONS: bending, sitting, standing, squatting, stairs, transfers, bed mobility, dressing, and locomotion level  PARTICIPATION LIMITATIONS: meal prep, cleaning, laundry, driving, shopping, and community activity  PERSONAL FACTORS: 1 comorbidity: Arthritis  including in R knee are also affecting patient's functional outcome.   REHAB POTENTIAL: Good  CLINICAL DECISION MAKING: Stable/uncomplicated  EVALUATION COMPLEXITY: Low   GOALS:   SHORT TERM GOALS:  Pt will be independent and compliant with HEP for improved pain, ROM, strength, and function.  Baseline: Goal status: GOAL MET Target date:  01/01/24   2.  Pt will demo a good quad set and be able to perform a supine SLR independently for improved strength and stability with gait.  Baseline:  Goal status: PROGRESSING Target date: 12/25/2023  3.  Pt will demo improved L knee flexion AROM to 95 deg for improved gait, stiffness, and mobility.  Baseline:  Goal status: GOAL MET 9/26 Target date:  01/08/2024  4.  Pt will demo improved quality of gait and ambulate with a SPC with good stability.  Baseline:  Goal status: INITIAL Target date:  01/08/2024   5.  Pt will be able to perform her normal transfers with no > than minimal difficulty.   Baseline:  Goal status: INITIAL  LONG TERM GOALS: Target date: 02/19/2024  Pt will demo improved R knee AROM to 0 - 115 deg for improved mobility and stiffness and for improved performance of stairs.  Baseline:  Goal status: GOAL MET  9/26  2.  Pt will ambulate with a normalized heel to toe gait without limping.  Baseline:  Goal status: INITIAL  3.   Pt will ambulate community distance without an AD without significant L knee pain and difficulty.  Baseline:  Goal status: INITIAL  4.   Pt will be able to perform household chores without significant L knee pain and difficulty.  Baseline:  Goal status: INITIAL  5.  Pt will demo improved strength to 4+/5 in R hip abd, 5/5 in Hip flexion, and 5/5 in knee ext and flexion for improved performance of daily functional mobility and to assist with returning to recreational activities.  Baseline:  Goal status: INITIAL  6.  Pt will be able to perform 4-5 steps with a reciprocal gait with the  rail for improved performance of stairs.  Baseline:  Goal status: INITIAL   PLAN:  PT FREQUENCY: 2-3x/wk x 1-2 weeks and 2x/wk afterwards   PT DURATION: 7 weeks  PLANNED INTERVENTIONS: 97164- PT Re-evaluation, 97750- Physical Performance Testing, 97110-Therapeutic exercises, 97530- Therapeutic activity, 97112- Neuromuscular re-education, 97535- Self Care, [id number]- Manual therapy, [phone]- Gait training, [phone]- Aquatic Therapy, [phone]- Electrical stimulation (unattended), Patient/Family education, Balance training, Stair training, Taping, Joint mobilization, Spinal mobilization, Scar mobilization, DME instructions, Cryotherapy, and Moist heat  PLAN FOR NEXT SESSION: Cont per TKR protocol.  Gait training.  Pt sees MD tomorrow.    Leigh Minerva III PT, DPT 01/15/24 3:17 PM

## 2024-01-19 ENCOUNTER — Ambulatory Visit (HOSPITAL_BASED_OUTPATIENT_CLINIC_OR_DEPARTMENT_OTHER): Admitting: Physical Therapy

## 2024-01-19 DIAGNOSIS — M6281 Muscle weakness (generalized): Secondary | ICD-10-CM | POA: Diagnosis not present

## 2024-01-19 DIAGNOSIS — M25662 Stiffness of left knee, not elsewhere classified: Secondary | ICD-10-CM

## 2024-01-19 DIAGNOSIS — M25562 Pain in left knee: Secondary | ICD-10-CM | POA: Diagnosis not present

## 2024-01-19 DIAGNOSIS — R262 Difficulty in walking, not elsewhere classified: Secondary | ICD-10-CM | POA: Diagnosis not present

## 2024-01-19 NOTE — Therapy (Signed)
 OUTPATIENT PHYSICAL THERAPY LOWER EXTREMITYTREATMENT   Patient Name: Laura Mills MRN: [id number] DOB:27-Aug-1960, 63 y.o., female Today's Date: 01/20/2024  END OF SESSION:  PT End of Session - 01/19/24 1508     Visit Number 16    Number of Visits 22    Date for Recertification  02/19/24    Authorization Type AETNA    PT Start Time 1502    PT Stop Time 1544    PT Time Calculation (min) 42 min    Activity Tolerance Patient tolerated treatment well    Behavior During Therapy WFL for tasks assessed/performed                Past Medical History:  Diagnosis Date   Allergy    Arthritis    LUMBAR SPINE AND RIGHT HIP   Cataract    GERD (gastroesophageal reflux disease)    History of kidney stones 10/2012   Hyperlipidemia    Hypertension    Renal stone    Past Surgical History:  Procedure Laterality Date   ABDOMINAL HYSTERECTOMY  2000   CHOLECYSTECTOMY  04/09/2007   CYSTOSCOPY WITH URETEROSCOPY Right 10/02/2012   Procedure: CYSTOSCOPY WITH RIGHT RETROGRADE AND STENT PLACEMENT ;  Surgeon: Donnice Gwenyth Brooks, MD;  Location: [facility];  Service: Urology;  Laterality: Right;   CYSTOSCOPY WITH URETEROSCOPY Right 11/13/2012   Procedure: RIGHT URETEROSCOPY;  Surgeon: Donnice Gwenyth Brooks, MD;  Location: [facility];  Service: Urology;  Laterality: Right;  with Stent   CYSTOSCOPY/URETEROSCOPY/HOLMIUM LASER/STENT PLACEMENT Right 05/31/2022   Procedure: CYSTOSCOPY RIGHT URETEROSCOPY/HOLMIUM LASER/STENT PLACEMENT;  Surgeon: Brooks Donnice, MD;  Location: [facility];  Service: Urology;  Laterality: Right;  60 MINS   LITHOTRIPSY  10/06/2012   PUBOVAGINAL SLING N/A 04/15/2017   Procedure: CARLOYN GLADE;  Surgeon: Brooks Donnice, MD;  Location: [facility];  Service: Urology;  Laterality: N/A;  NEEDS 90 MIN FOR PROCEDURE   ROTATOR CUFF REPAIR Left    TYMPANOSTOMY TUBE PLACEMENT Right 2019   to clear up MRSA   There are no active problems to display for this  patient.   REFERRING PROVIDER: Beverley Evalene BIRCH, MD  REFERRING DIAG: L TKR  THERAPY DIAG:  Left knee pain, unspecified chronicity  Muscle weakness (generalized)  Difficulty in walking, not elsewhere classified  Stiffness of left knee, not elsewhere classified  Rationale for Evaluation and Treatment: Rehabilitation  ONSET DATE: DOS  12/04/2023  SUBJECTIVE:   SUBJECTIVE STATEMENT: Pt denies any adverse effects after prior treatment.  Pt states she had increased medial knee pain over the weekend.  Pt states it's tender to touch. Pt states she was tired including her leg feeling very fatigued after walking in the grocery store.  Pt reports she took care of her 5 grandchildren. Pt reports her back has not been bothering her recently.  Pt has been driving.  Pt states she misunderstood about her MD appt and actually missed it.  She sees her MD next week.       PERTINENT HISTORY: L TKR  12/04/2023 Severe arthritis R knee which pt receives injections, plan for R TKR in December Arthritis in lumbar and R hip also HTN  PAIN:  NPRS:  1-2/10 L medial knee.     PRECAUTIONS: per dx; R knee OA--planning for R knee TKR   WEIGHT BEARING RESTRICTIONS: FWB  FALLS:  Has patient fallen in last 6 months? Yes. Number of falls 2, R ankle rolled with new shoes ;  fell in the bathroom when the towel  bar broke off the wall  LIVING ENVIRONMENT: Lives with: lives with their spouse Lives in: 1 story home Stairs: 3 steps to enter home with 1 rail Has following equipment at home: FWW, crutches, SPC, 3 pronged cane  OCCUPATION: part time home care, primarily sitting  PLOF: Independent  PATIENT GOALS: to improve pain and mobility   OBJECTIVE:  Note: Objective measures were completed at Evaluation unless otherwise noted.  DIAGNOSTIC FINDINGS: Pt is post op.    LOWER EXTREMITY ROM:   ROM Right eval Left eval Left 9/8 Left 9/12 Left 9/19 Left 9/26 AROM Left 10/3 AROM  Hip  flexion         Hip extension         Hip abduction         Hip adduction         Hip internal rotation         Hip external rotation         Knee flexion  69/88 AROM/PROM 97 deg 107 deg  AAROM 119 AROM after PROM and AAROM 125 130  Knee extension  0 deg AROM 0 deg AROM   0   Ankle dorsiflexion         Ankle plantarflexion         Ankle inversion         Ankle eversion          (Blank rows = not tested)                                                                                                                                TREATMENT:    Pt performed: Scifit bike x 5 mins lvl 2 Supine SLR 2x10 LAQ 2# x 10 reps, 3# 2x10 Seated HS curls with RTB 3x10 TKE GTB 2x10 Standing hip abduction with YTB above knees 2x10 L LE, 1x10 R LE Step ups 6 inch step 2x10 with UE support Lateral step ups 2x10  6 inch step Mini squats with UE support 2x10      PATIENT EDUCATION:  Education details:  Educated pt in post op and protocol restrictions and expectations.  ROM, gait, HEP, exercise form, dx, prognosis, and POC.  PT answered pt's questions.  Person educated: Patient Education method: Explanation, Demonstration, Tactile cues, Verbal cues, Education comprehension: verbalized understanding, returned demonstration, verbal cues required, and tactile cues required  HOME EXERCISE PROGRAM: Access Code: [email or web address] URL: [email or web address] Date: 12/12/2023 Prepared by: Mose Minerva  Exercises - Supine Quadricep Sets  - 2 x daily - 7 x weekly - 2 sets - 10 reps - 5 seconds hold - Supine Gluteal Sets  - 2 x daily - 7 x weekly - 2 sets - 10 reps - 5 seconds hold - Supine Ankle Pumps  - 7 x weekly - Longsitting Heel Slide  - 2-3 x daily - 7 x weekly - 1 sets - 10 reps -Sitting with knee flexed for  3-5 mins comfortably at various times for 6-7 times/day - Supine Short Arc Quad  - 1 x daily - 7 x weekly - 2 sets - 10 reps - Supine Heel Slide with Strap  - 2 x daily - 7 x weekly  - 1-2 sets - 10 reps - Long Sitting Calf Stretch with Strap  - 2 x daily - 7 x weekly - 2-3 reps - 20 seconds hold - Seated Long Arc Quad  - 1 x daily - 7 x weekly - 2 sets - 10 reps - Heel Raises with Counter Support  - 1 x daily - 7 x weekly - 2 sets - 10 reps - Supine Active Straight Leg Raise  - 1 x daily - 7 x weekly - 2 sets - 10 reps - Sidelying Hip Abduction  - 1 x daily - 7 x weekly - 2 sets - 10 reps - Standing Terminal Knee Extension with Resistance  - 1 x daily - 5-6 x weekly - 2 sets - 10 reps   ASSESSMENT:  CLINICAL IMPRESSION: Pt is 6 weeks and 4 days s/p L TKR making excellent progress.  Pt is ambulating well with SPC.  She is improving with L LE strength as evidenced by performance of exercises.  Pt performed protocol exercises well without c/o's.  She responded well to treatment having no c/o's after treatment.  She should benefit from cont skilled PT per protocol to address impairments and goals and to assist in restoring desired level of function.         OBJECTIVE IMPAIRMENTS: Abnormal gait, decreased activity tolerance, decreased endurance, decreased knowledge of use of DME, decreased mobility, difficulty walking, decreased ROM, decreased strength, hypomobility, increased edema, impaired flexibility, and pain.   ACTIVITY LIMITATIONS: bending, sitting, standing, squatting, stairs, transfers, bed mobility, dressing, and locomotion level  PARTICIPATION LIMITATIONS: meal prep, cleaning, laundry, driving, shopping, and community activity  PERSONAL FACTORS: 1 comorbidity: Arthritis including in R knee are also affecting patient's functional outcome.   REHAB POTENTIAL: Good  CLINICAL DECISION MAKING: Stable/uncomplicated  EVALUATION COMPLEXITY: Low   GOALS:   SHORT TERM GOALS:  Pt will be independent and compliant with HEP for improved pain, ROM, strength, and function.  Baseline: Goal status: GOAL MET Target date:  01/01/24   2.  Pt will demo a good quad set  and be able to perform a supine SLR independently for improved strength and stability with gait.  Baseline:  Goal status: PROGRESSING Target date: 12/25/2023  3.  Pt will demo improved L knee flexion AROM to 95 deg for improved gait, stiffness, and mobility.  Baseline:  Goal status: GOAL MET 9/26 Target date:  01/08/2024  4.  Pt will demo improved quality of gait and ambulate with a SPC with good stability.  Baseline:  Goal status: INITIAL Target date:  01/08/2024   5.  Pt will be able to perform her normal transfers with no > than minimal difficulty.   Baseline:  Goal status: INITIAL    LONG TERM GOALS: Target date: 02/19/2024  Pt will demo improved R knee AROM to 0 - 115 deg for improved mobility and stiffness and for improved performance of stairs.  Baseline:  Goal status: GOAL MET  9/26  2.  Pt will ambulate with a normalized heel to toe gait without limping.  Baseline:  Goal status: INITIAL  3.   Pt will ambulate community distance without an AD without significant L knee pain and difficulty.  Baseline:  Goal status:  INITIAL  4.   Pt will be able to perform household chores without significant L knee pain and difficulty.  Baseline:  Goal status: INITIAL  5.  Pt will demo improved strength to 4+/5 in R hip abd, 5/5 in Hip flexion, and 5/5 in knee ext and flexion for improved performance of daily functional mobility and to assist with returning to recreational activities.  Baseline:  Goal status: INITIAL  6.  Pt will be able to perform 4-5 steps with a reciprocal gait with the rail for improved performance of stairs.  Baseline:  Goal status: INITIAL   PLAN:  PT FREQUENCY: 2-3x/wk x 1-2 weeks and 2x/wk afterwards   PT DURATION: 7 weeks  PLANNED INTERVENTIONS: 97164- PT Re-evaluation, 97750- Physical Performance Testing, 97110-Therapeutic exercises, 97530- Therapeutic activity, 97112- Neuromuscular re-education, 97535- Self Care, 02859- Manual therapy, [phone]- Gait  training, [phone]- Aquatic Therapy, [phone]- Electrical stimulation (unattended), Patient/Family education, Balance training, Stair training, Taping, Joint mobilization, Spinal mobilization, Scar mobilization, DME instructions, Cryotherapy, and Moist heat  PLAN FOR NEXT SESSION: Cont per TKR protocol.  Gait training.  Perform gait without AD next visit.     Leigh Minerva III PT, DPT 01/20/24 9:01 PM

## 2024-01-20 ENCOUNTER — Encounter (HOSPITAL_BASED_OUTPATIENT_CLINIC_OR_DEPARTMENT_OTHER): Payer: Self-pay | Admitting: Physical Therapy

## 2024-01-21 DIAGNOSIS — M1711 Unilateral primary osteoarthritis, right knee: Secondary | ICD-10-CM | POA: Diagnosis not present

## 2024-01-21 DIAGNOSIS — Z471 Aftercare following joint replacement surgery: Secondary | ICD-10-CM | POA: Diagnosis not present

## 2024-01-21 DIAGNOSIS — Z96652 Presence of left artificial knee joint: Secondary | ICD-10-CM | POA: Diagnosis not present

## 2024-01-22 ENCOUNTER — Ambulatory Visit (HOSPITAL_BASED_OUTPATIENT_CLINIC_OR_DEPARTMENT_OTHER): Admitting: Physical Therapy

## 2024-01-22 DIAGNOSIS — M6281 Muscle weakness (generalized): Secondary | ICD-10-CM

## 2024-01-22 DIAGNOSIS — M25662 Stiffness of left knee, not elsewhere classified: Secondary | ICD-10-CM | POA: Diagnosis not present

## 2024-01-22 DIAGNOSIS — M25562 Pain in left knee: Secondary | ICD-10-CM

## 2024-01-22 DIAGNOSIS — R262 Difficulty in walking, not elsewhere classified: Secondary | ICD-10-CM | POA: Diagnosis not present

## 2024-01-22 NOTE — Therapy (Signed)
 OUTPATIENT PHYSICAL THERAPY LOWER EXTREMITYTREATMENT   Patient Name: Laura Mills MRN: [id number] DOB:01/31/1961, 63 y.o., female Today's Date: 01/23/2024  END OF SESSION:  PT End of Session - 01/22/24 1500     Visit Number 17    Number of Visits 22    Date for Recertification  02/19/24    Authorization Type AET[employer]    PT Start Time 1458    PT Stop Time 1540    PT Time Calculation (min) 42 min    Activity Tolerance Patient tolerated treatment well    Behavior During Therapy WFL for tasks assessed/performed                Past Medical History:  Diagnosis Date   Allergy    Arthritis    LUMBAR SPINE AND RIGHT HIP   Cataract    GERD (gastroesophageal reflux disease)    History of kidney stones 10/2012   Hyperlipidemia    Hypertension    Renal stone    Past Surgical History:  Procedure Laterality Date   ABDOMINAL HYSTERECTOMY  2000   CHOLECYSTECTOMY  04/09/2007   CYSTOSCOPY WITH URETEROSCOPY Right 10/02/2012   Procedure: CYSTOSCOPY WITH RIGHT RETROGRADE AND STENT PLACEMENT ;  Surgeon: Donnice Gwenyth Brooks, MD;  Location: [facility];  Service: Urology;  Laterality: Right;   CYSTOSCOPY WITH URETEROSCOPY Right 11/13/2012   Procedure: RIGHT URETEROSCOPY;  Surgeon: Donnice Gwenyth Brooks, MD;  Location: [facility];  Service: Urology;  Laterality: Right;  with Stent   CYSTOSCOPY/URETEROSCOPY/HOLMIUM LASER/STENT PLACEMENT Right 05/31/2022   Procedure: CYSTOSCOPY RIGHT URETEROSCOPY/HOLMIUM LASER/STENT PLACEMENT;  Surgeon: Brooks Donnice, MD;  Location: [facility];  Service: Urology;  Laterality: Right;  60 MINS   LITHOTRIPSY  10/06/2012   PUBOVAGINAL SLING N/A 04/15/2017   Procedure: CARLOYN GLADE;  Surgeon: Brooks Donnice, MD;  Location: [facility];  Service: Urology;  Laterality: N/A;  NEEDS 90 MIN FOR PROCEDURE   ROTATOR CUFF REPAIR Left    TYMPANOSTOMY TUBE PLACEMENT Right 2019   to clear up MRSA   There are no active problems to display for this  patient.   REFERRING PROVIDER: Beverley Evalene BIRCH, MD  REFERRING DIAG: L TKR  THERAPY DIAG:  Left knee pain, unspecified chronicity  Muscle weakness (generalized)  Difficulty in walking, not elsewhere classified  Stiffness of left knee, not elsewhere classified  Rationale for Evaluation and Treatment: Rehabilitation  ONSET DATE: DOS  12/04/2023  SUBJECTIVE:   SUBJECTIVE STATEMENT: Pt saw MD yesterday and he cleared for pt to return to the pool and she can take a bath and use the hot tub.  Pt states he is allowing pt to use the scar cream and also massage the scar.  Pt went to the pool and the hot tub yesterday and performed her exercises in the pool and the hot tub.  Pt had no adverse effects.   Pt denies pain currently though does have stiffness.  Pt denies any adverse effects after prior treatment.   Pt reports her insurance only allows 20 visits and she used 1 of those visits for prehab.   PERTINENT HISTORY: L TKR  12/04/2023 Severe arthritis R knee which pt receives injections, plan for R TKR in December Arthritis in lumbar and R hip also HTN  PAIN:  NPRS:  0/10 L knee.     PRECAUTIONS: per dx; R knee OA--planning for R knee TKR   WEIGHT BEARING RESTRICTIONS: FWB  FALLS:  Has patient fallen in last 6 months? Yes. Number of falls 2, R  ankle rolled with new shoes ;  fell in the bathroom when the towel bar broke off the wall  LIVING ENVIRONMENT: Lives with: lives with their spouse Lives in: 1 story home Stairs: 3 steps to enter home with 1 rail Has following equipment at home: FWW, crutches, SPC, 3 pronged cane  OCCUPATION: part time home care, primarily sitting  PLOF: Independent  PATIENT GOALS: to improve pain and mobility   OBJECTIVE:  Note: Objective measures were completed at Evaluation unless otherwise noted.  DIAGNOSTIC FINDINGS: Pt is post op.    LOWER EXTREMITY ROM:   ROM Right eval Left eval Left 9/8 Left 9/12 Left 9/19 Left 9/26 AROM  Left 10/3 AROM  Hip flexion         Hip extension         Hip abduction         Hip adduction         Hip internal rotation         Hip external rotation         Knee flexion  69/88 AROM/PROM 97 deg 107 deg  AAROM 119 AROM after PROM and AAROM 125 130  Knee extension  0 deg AROM 0 deg AROM   0   Ankle dorsiflexion         Ankle plantarflexion         Ankle inversion         Ankle eversion          (Blank rows = not tested)                                                                                                                                TREATMENT:    Pt performed: Scifit bike x 5 mins lvl 2-3 LAQ 3# 3x10 Seated HS curls with RTB 3x10 Standing hip abduction with YTB above knees 2x10 L LE, 1x10 R LE Step ups 6 inch step 2x10 with UE support and 8 inch 1x10 with UE support Lateral step ups 2x10  6 inch step Mini squats with UE support 2x10  Pt ambulated without AD in the hallway.  Pt had good stability and no LOB.      PATIENT EDUCATION:  Education details:  Educated pt in post op and protocol restrictions and expectations.  ROM, gait, HEP, exercise form, dx, prognosis, and POC.  PT answered pt's questions.  Person educated: Patient Education method: Explanation, Demonstration, Tactile cues, Verbal cues, Education comprehension: verbalized understanding, returned demonstration, verbal cues required, and tactile cues required  HOME EXERCISE PROGRAM: Access Code: [email or web address] URL: [email or web address] Date: 12/12/2023 Prepared by: Mose Minerva  Exercises - Supine Quadricep Sets  - 2 x daily - 7 x weekly - 2 sets - 10 reps - 5 seconds hold - Supine Gluteal Sets  - 2 x daily - 7 x weekly - 2 sets - 10 reps - 5 seconds hold - Supine Ankle Pumps  -  7 x weekly - Longsitting Heel Slide  - 2-3 x daily - 7 x weekly - 1 sets - 10 reps -Sitting with knee flexed for 3-5 mins comfortably at various times for 6-7 times/day - Supine Short Arc Quad  - 1 x daily  - 7 x weekly - 2 sets - 10 reps - Supine Heel Slide with Strap  - 2 x daily - 7 x weekly - 1-2 sets - 10 reps - Long Sitting Calf Stretch with Strap  - 2 x daily - 7 x weekly - 2-3 reps - 20 seconds hold - Seated Long Arc Quad  - 1 x daily - 7 x weekly - 2 sets - 10 reps - Heel Raises with Counter Support  - 1 x daily - 7 x weekly - 2 sets - 10 reps - Supine Active Straight Leg Raise  - 1 x daily - 7 x weekly - 2 sets - 10 reps - Sidelying Hip Abduction  - 1 x daily - 7 x weekly - 2 sets - 10 reps - Standing Terminal Knee Extension with Resistance  - 1 x daily - 5-6 x weekly - 2 sets - 10 reps   ASSESSMENT:  CLINICAL IMPRESSION: Pt is 7 weeks s/p L TKR making excellent progress.  PT had pt ambulate in the hallway and clinic without AD.  Pt ambulated well without AD.  She had no LOB or instability though did begin to limp the more she walked.  MD has released her to begin pool exercises.  She went to the pool and hot tub last night and performed walking and exercises without adverse effects.  Pt performed protocol exercises well without c/o's.  PT increased step height to an 8 inch step and pt tolerated it well.  She continues to improve with L LE strength.  She responded well to treatment having no c/o's after treatment.   Pt reports having a limited number of visits per her insurance policy.  PT will need to decrease frequency.  Pt is planning to have her R knee replaced in December.          OBJECTIVE IMPAIRMENTS: Abnormal gait, decreased activity tolerance, decreased endurance, decreased knowledge of use of DME, decreased mobility, difficulty walking, decreased ROM, decreased strength, hypomobility, increased edema, impaired flexibility, and pain.   ACTIVITY LIMITATIONS: bending, sitting, standing, squatting, stairs, transfers, bed mobility, dressing, and locomotion level  PARTICIPATION LIMITATIONS: meal prep, cleaning, laundry, driving, shopping, and community activity  PERSONAL FACTORS:  1 comorbidity: Arthritis including in R knee are also affecting patient's functional outcome.   REHAB POTENTIAL: Good  CLINICAL DECISION MAKING: Stable/uncomplicated  EVALUATION COMPLEXITY: Low   GOALS:   SHORT TERM GOALS:  Pt will be independent and compliant with HEP for improved pain, ROM, strength, and function.  Baseline: Goal status: GOAL MET Target date:  01/01/24   2.  Pt will demo a good quad set and be able to perform a supine SLR independently for improved strength and stability with gait.  Baseline:  Goal status: PROGRESSING Target date: 12/25/2023  3.  Pt will demo improved L knee flexion AROM to 95 deg for improved gait, stiffness, and mobility.  Baseline:  Goal status: GOAL MET 9/26 Target date:  01/08/2024  4.  Pt will demo improved quality of gait and ambulate with a SPC with good stability.  Baseline:  Goal status: INITIAL Target date:  01/08/2024   5.  Pt will be able to perform her normal transfers with  no > than minimal difficulty.   Baseline:  Goal status: INITIAL    LONG TERM GOALS: Target date: 02/19/2024  Pt will demo improved R knee AROM to 0 - 115 deg for improved mobility and stiffness and for improved performance of stairs.  Baseline:  Goal status: GOAL MET  9/26  2.  Pt will ambulate with a normalized heel to toe gait without limping.  Baseline:  Goal status: INITIAL  3.   Pt will ambulate community distance without an AD without significant L knee pain and difficulty.  Baseline:  Goal status: INITIAL  4.   Pt will be able to perform household chores without significant L knee pain and difficulty.  Baseline:  Goal status: INITIAL  5.  Pt will demo improved strength to 4+/5 in R hip abd, 5/5 in Hip flexion, and 5/5 in knee ext and flexion for improved performance of daily functional mobility and to assist with returning to recreational activities.  Baseline:  Goal status: INITIAL  6.  Pt will be able to perform 4-5 steps with a  reciprocal gait with the rail for improved performance of stairs.  Baseline:  Goal status: INITIAL   PLAN:  PT FREQUENCY: 2-3x/wk x 1-2 weeks and 2x/wk afterwards   PT DURATION: 7 weeks  PLANNED INTERVENTIONS: 97164- PT Re-evaluation, 97750- Physical Performance Testing, 97110-Therapeutic exercises, 97530- Therapeutic activity, 97112- Neuromuscular re-education, 97535- Self Care, 02859- Manual therapy, [phone]- Gait training, [phone]- Aquatic Therapy, [phone]- Electrical stimulation (unattended), Patient/Family education, Balance training, Stair training, Taping, Joint mobilization, Spinal mobilization, Scar mobilization, DME instructions, Cryotherapy, and Moist heat  PLAN FOR NEXT SESSION: Cont per TKR protocol.  Gait training.  Perform gait without AD next visit.  Will need to decrease frequency due to insurance visit limit.   Leigh Minerva III PT, DPT 01/23/24 4:28 PM

## 2024-01-23 ENCOUNTER — Encounter (HOSPITAL_BASED_OUTPATIENT_CLINIC_OR_DEPARTMENT_OTHER): Payer: Self-pay | Admitting: Physical Therapy

## 2024-01-26 ENCOUNTER — Ambulatory Visit (HOSPITAL_BASED_OUTPATIENT_CLINIC_OR_DEPARTMENT_OTHER): Admitting: Physical Therapy

## 2024-01-29 ENCOUNTER — Ambulatory Visit (HOSPITAL_BASED_OUTPATIENT_CLINIC_OR_DEPARTMENT_OTHER): Admitting: Physical Therapy

## 2024-01-29 ENCOUNTER — Encounter (HOSPITAL_BASED_OUTPATIENT_CLINIC_OR_DEPARTMENT_OTHER): Payer: Self-pay | Admitting: Physical Therapy

## 2024-01-29 DIAGNOSIS — M25562 Pain in left knee: Secondary | ICD-10-CM | POA: Diagnosis not present

## 2024-01-29 DIAGNOSIS — M6281 Muscle weakness (generalized): Secondary | ICD-10-CM

## 2024-01-29 DIAGNOSIS — M25662 Stiffness of left knee, not elsewhere classified: Secondary | ICD-10-CM

## 2024-01-29 DIAGNOSIS — R262 Difficulty in walking, not elsewhere classified: Secondary | ICD-10-CM

## 2024-01-29 NOTE — Therapy (Signed)
 OUTPATIENT PHYSICAL THERAPY LOWER EXTREMITYTREATMENT   Patient Name: Laura Mills MRN: [id number] DOB:November 25, 1960, 63 y.o., female Today's Date: 01/30/2024  END OF SESSION:  PT End of Session - 01/29/24 1456     Visit Number 18    Number of Visits 22    Date for Recertification  02/19/24    Authorization Type AETNA    PT Start Time 1454    PT Stop Time 1539    PT Time Calculation (min) 45 min    Activity Tolerance Patient tolerated treatment well    Behavior During Therapy [facility] for tasks assessed/performed                Past Medical History:  Diagnosis Date   Allergy    Arthritis    LUMBAR SPINE AND RIGHT HIP   Cataract    GERD (gastroesophageal reflux disease)    History of kidney stones 10/2012   Hyperlipidemia    Hypertension    Renal stone    Past Surgical History:  Procedure Laterality Date   ABDOMINAL HYSTERECTOMY  2000   CHOLECYSTECTOMY  04/09/2007   CYSTOSCOPY WITH URETEROSCOPY Right 10/02/2012   Procedure: CYSTOSCOPY WITH RIGHT RETROGRADE AND STENT PLACEMENT ;  Surgeon: Donnice Gwenyth Brooks, MD;  Location: [facility];  Service: Urology;  Laterality: Right;   CYSTOSCOPY WITH URETEROSCOPY Right 11/13/2012   Procedure: RIGHT URETEROSCOPY;  Surgeon: Donnice Gwenyth Brooks, MD;  Location: [facility];  Service: Urology;  Laterality: Right;  with Stent   CYSTOSCOPY/URETEROSCOPY/HOLMIUM LASER/STENT PLACEMENT Right 05/31/2022   Procedure: CYSTOSCOPY RIGHT URETEROSCOPY/HOLMIUM LASER/STENT PLACEMENT;  Surgeon: Brooks Donnice, MD;  Location: [facility];  Service: Urology;  Laterality: Right;  60 MINS   LITHOTRIPSY  10/06/2012   PUBOVAGINAL SLING N/A 04/15/2017   Procedure: CARLOYN GLADE;  Surgeon: Brooks Donnice, MD;  Location: [facility];  Service: Urology;  Laterality: N/A;  NEEDS 90 MIN FOR PROCEDURE   ROTATOR CUFF REPAIR Left    TYMPANOSTOMY TUBE PLACEMENT Right 2019   to clear up MRSA   There are no active problems to display for this  patient.   REFERRING PROVIDER: Beverley Evalene BIRCH, MD  REFERRING DIAG: L TKR  THERAPY DIAG:  Left knee pain, unspecified chronicity  Muscle weakness (generalized)  Difficulty in walking, not elsewhere classified  Stiffness of left knee, not elsewhere classified  Rationale for Evaluation and Treatment: Rehabilitation  ONSET DATE: DOS  12/04/2023  SUBJECTIVE:   SUBJECTIVE STATEMENT: Pt is 8 weeks post op.  Pt states her back has been bothering her and today is not a good day for her back.  She took care of 5 grandchildren today.  Pt used heat for her back.  Pt states she has been having a lot of trouble with R knee.  Pt performed her exercises in the hot tub yesterday.     PERTINENT HISTORY: L TKR  12/04/2023 Severe arthritis R knee which pt receives injections, plan for R TKR in December Arthritis in lumbar and R hip also HTN  PAIN:  NPRS:  0/10 L knee.  4/10 lumbar   PRECAUTIONS: per dx; R knee OA--planning for R knee TKR   WEIGHT BEARING RESTRICTIONS: FWB  FALLS:  Has patient fallen in last 6 months? Yes. Number of falls 2, R ankle rolled with new shoes ;  fell in the bathroom when the towel bar broke off the wall  LIVING ENVIRONMENT: Lives with: lives with their spouse Lives in: 1 story home Stairs: 3 steps to enter home with 1  rail Has following equipment at home: FWW, crutches, SPC, 3 pronged cane  OCCUPATION: part time home care, primarily sitting  PLOF: Independent  PATIENT GOALS: to improve pain and mobility   OBJECTIVE:  Note: Objective measures were completed at Evaluation unless otherwise noted.  DIAGNOSTIC FINDINGS: Pt is post op.    LOWER EXTREMITY ROM:   ROM Right eval Left eval Left 9/8 Left 9/12 Left 9/19 Left 9/26 AROM Left 10/3 AROM  Hip flexion         Hip extension         Hip abduction         Hip adduction         Hip internal rotation         Hip external rotation         Knee flexion  69/88 AROM/PROM 97 deg 107 deg   AAROM 119 AROM after PROM and AAROM 125 130  Knee extension  0 deg AROM 0 deg AROM   0   Ankle dorsiflexion         Ankle plantarflexion         Ankle inversion         Ankle eversion          (Blank rows = not tested)                                                                                                                                TREATMENT:    Gait:  pt ambulated without cane in the hallway.  She had good stability and no LOB.  Pt does have a limp though seems more due to her R LE.   R knee AROM:  0 - 145 deg L knee AROM:  0 deg in resting, 135 deg   L knee extension:  4/5 MMT  Pt performed: Scifit bike x 5 mins lvl 2 LAQ 4# 3x10 Seated HS curls with RTB 3x10 Standing hip abduction with RTB above knees 2x10 L LE, 1x10 R LE Lateral band walks with RTB above knees x 2 laps at rail TKE with GTB 2x10-12 Step ups 8 inch 2x10 with UE support  Pt ascended stairs with a step to gait (3 steps) and reciprocal gait (7 steps) with the rail.  Pt descended stairs with a step to gait leading with the R with rail.      PATIENT EDUCATION:  Education details:  Educated pt in post op and protocol restrictions and expectations.  ROM, gait, HEP, exercise form, dx, prognosis, and POC.  PT answered pt's questions.  Person educated: Patient Education method: Explanation, Demonstration, Tactile cues, Verbal cues, Education comprehension: verbalized understanding, returned demonstration, verbal cues required, and tactile cues required  HOME EXERCISE PROGRAM: Access Code: [email or web address] URL: [email or web address] Date: 12/12/2023 Prepared by: Mose Minerva  Exercises - Supine Quadricep Sets  - 2 x daily - 7 x weekly - 2 sets - 10 reps - 5  seconds hold - Supine Gluteal Sets  - 2 x daily - 7 x weekly - 2 sets - 10 reps - 5 seconds hold - Supine Ankle Pumps  - 7 x weekly - Longsitting Heel Slide  - 2-3 x daily - 7 x weekly - 1 sets - 10 reps -Sitting with knee flexed for 3-5  mins comfortably at various times for 6-7 times/day - Supine Short Arc Quad  - 1 x daily - 7 x weekly - 2 sets - 10 reps - Supine Heel Slide with Strap  - 2 x daily - 7 x weekly - 1-2 sets - 10 reps - Long Sitting Calf Stretch with Strap  - 2 x daily - 7 x weekly - 2-3 reps - 20 seconds hold - Seated Long Arc Quad  - 1 x daily - 7 x weekly - 2 sets - 10 reps - Heel Raises with Counter Support  - 1 x daily - 7 x weekly - 2 sets - 10 reps - Supine Active Straight Leg Raise  - 1 x daily - 7 x weekly - 2 sets - 10 reps - Sidelying Hip Abduction  - 1 x daily - 7 x weekly - 2 sets - 10 reps - Standing Terminal Knee Extension with Resistance  - 1 x daily - 5-6 x weekly - 2 sets - 10 reps   ASSESSMENT:  CLINICAL IMPRESSION: Pt is 8 weeks s/p L TKR.  She has excellent knee AROM.  Pt has full knee extension ROM and 135 deg of flexion.  Pt is improving with strength as evidenced by performance of exercises.  PT assessed L quad and she does have quad weakness.  Pt ambulated without an AD with good stability and no LOB.  Pt was limping some though appears to be due to R LE.  Pt has improved performance of stairs being able to reciprocally ascend the stairs with the rail.   She has difficulty with descending stairs performing a step to gait with the rail.  Pt partially met LTG #6.  Pt reports having a limited number of visits per her insurance policy and the next appt will be the last visit authorized.  Pt is planning to have her R knee replaced in December.  PT to call her insurance to determine if they will authorize further visits.  Pt responded well to treatment having no c/o's after treatment.           OBJECTIVE IMPAIRMENTS: Abnormal gait, decreased activity tolerance, decreased endurance, decreased knowledge of use of DME, decreased mobility, difficulty walking, decreased ROM, decreased strength, hypomobility, increased edema, impaired flexibility, and pain.   ACTIVITY LIMITATIONS: bending, sitting,  standing, squatting, stairs, transfers, bed mobility, dressing, and locomotion level  PARTICIPATION LIMITATIONS: meal prep, cleaning, laundry, driving, shopping, and community activity  PERSONAL FACTORS: 1 comorbidity: Arthritis including in R knee are also affecting patient's functional outcome.   REHAB POTENTIAL: Good  CLINICAL DECISION MAKING: Stable/uncomplicated  EVALUATION COMPLEXITY: Low   GOALS:   SHORT TERM GOALS:  Pt will be independent and compliant with HEP for improved pain, ROM, strength, and function.  Baseline: Goal status: GOAL MET Target date:  01/01/24   2.  Pt will demo a good quad set and be able to perform a supine SLR independently for improved strength and stability with gait.  Baseline:  Goal status: PROGRESSING Target date: 12/25/2023  3.  Pt will demo improved L knee flexion AROM to 95 deg for improved gait, stiffness,  and mobility.  Baseline:  Goal status: GOAL MET 9/26 Target date:  01/08/2024  4.  Pt will demo improved quality of gait and ambulate with a SPC with good stability.  Baseline:  Goal status: INITIAL Target date:  01/08/2024   5.  Pt will be able to perform her normal transfers with no > than minimal difficulty.   Baseline:  Goal status: INITIAL    LONG TERM GOALS: Target date: 02/19/2024  Pt will demo improved R knee AROM to 0 - 115 deg for improved mobility and stiffness and for improved performance of stairs.  Baseline:  Goal status: GOAL MET  9/26  2.  Pt will ambulate with a normalized heel to toe gait without limping.  Baseline:  Goal status: INITIAL  3.   Pt will ambulate community distance without an AD without significant L knee pain and difficulty.  Baseline:  Goal status: INITIAL  4.   Pt will be able to perform household chores without significant L knee pain and difficulty.  Baseline:  Goal status: INITIAL  5.  Pt will demo improved strength to 4+/5 in R hip abd, 5/5 in Hip flexion, and 5/5 in knee ext  and flexion for improved performance of daily functional mobility and to assist with returning to recreational activities.  Baseline:  Goal status: INITIAL  6.  Pt will be able to perform 4-5 steps with a reciprocal gait with the rail for improved performance of stairs.  Baseline:  Goal status: 50%  MET  10/24   PLAN:  PT FREQUENCY: 2-3x/wk x 1-2 weeks and 2x/wk afterwards   PT DURATION: 7 weeks  PLANNED INTERVENTIONS: 97164- PT Re-evaluation, 97750- Physical Performance Testing, 97110-Therapeutic exercises, 97530- Therapeutic activity, 97112- Neuromuscular re-education, 97535- Self Care, 02859- Manual therapy, [phone]- Gait training, [phone]- Aquatic Therapy, [phone]- Electrical stimulation (unattended), Patient/Family education, Balance training, Stair training, Taping, Joint mobilization, Spinal mobilization, Scar mobilization, DME instructions, Cryotherapy, and Moist heat  PLAN FOR NEXT SESSION:  Cont per TKR protocol.  Gait training.  Possible discharge in the next visit or next few visits.  Her next visit is the last authorized visit.  Work on [employer].      Leigh Minerva III PT, DPT 01/30/24 4:55 PM

## 2024-02-02 ENCOUNTER — Ambulatory Visit (HOSPITAL_BASED_OUTPATIENT_CLINIC_OR_DEPARTMENT_OTHER): Admitting: Physical Therapy

## 2024-02-03 DIAGNOSIS — M545 Low back pain, unspecified: Secondary | ICD-10-CM | POA: Diagnosis not present

## 2024-02-05 ENCOUNTER — Ambulatory Visit (HOSPITAL_BASED_OUTPATIENT_CLINIC_OR_DEPARTMENT_OTHER): Admitting: Physical Therapy

## 2024-02-05 DIAGNOSIS — R262 Difficulty in walking, not elsewhere classified: Secondary | ICD-10-CM

## 2024-02-05 DIAGNOSIS — M25662 Stiffness of left knee, not elsewhere classified: Secondary | ICD-10-CM | POA: Diagnosis not present

## 2024-02-05 DIAGNOSIS — M25562 Pain in left knee: Secondary | ICD-10-CM | POA: Diagnosis not present

## 2024-02-05 DIAGNOSIS — M6281 Muscle weakness (generalized): Secondary | ICD-10-CM

## 2024-02-05 NOTE — Therapy (Signed)
 OUTPATIENT PHYSICAL THERAPY LOWER EXTREMITYTREATMENT   Patient Name: Laura Mills MRN: [id number] DOB:[birth year], 63 y.o., female Today's Date: 02/06/2024  END OF SESSION:  PT End of Session - 02/05/24 1457     Visit Number 19    Number of Visits 22    Date for Recertification  02/19/24    Authorization Type AET[employer]    PT Start Time 1455    PT Stop Time 1538    PT Time Calculation (min) 43 min    Activity Tolerance Patient tolerated treatment well    Behavior During Therapy WFL for tasks assessed/performed                Past Medical History:  Diagnosis Date   Allergy    Arthritis    LUMBAR SPINE AND RIGHT HIP   Cataract    GERD (gastroesophageal reflux disease)    History of kidney stones 10/2012   Hyperlipidemia    Hypertension    Renal stone    Past Surgical History:  Procedure Laterality Date   ABDOMINAL HYSTERECTOMY  2000   CHOLECYSTECTOMY  04/09/2007   CYSTOSCOPY WITH URETEROSCOPY Right 10/02/2012   Procedure: CYSTOSCOPY WITH RIGHT RETROGRADE AND STENT PLACEMENT ;  Surgeon: Donnice Gwenyth Brooks, MD;  Location: [facility];  Service: Urology;  Laterality: Right;   CYSTOSCOPY WITH URETEROSCOPY Right 11/13/2012   Procedure: RIGHT URETEROSCOPY;  Surgeon: Donnice Gwenyth Brooks, MD;  Location: [facility];  Service: Urology;  Laterality: Right;  with Stent   CYSTOSCOPY/URETEROSCOPY/HOLMIUM LASER/STENT PLACEMENT Right 05/31/2022   Procedure: CYSTOSCOPY RIGHT URETEROSCOPY/HOLMIUM LASER/STENT PLACEMENT;  Surgeon: Brooks Donnice, MD;  Location: [facility];  Service: Urology;  Laterality: Right;  60 MINS   LITHOTRIPSY  10/06/2012   PUBOVAGINAL SLING N/A 04/15/2017   Procedure: CARLOYN GLADE;  Surgeon: Brooks Donnice, MD;  Location: [facility];  Service: Urology;  Laterality: N/A;  NEEDS 90 MIN FOR PROCEDURE   ROTATOR CUFF REPAIR Left    TYMPANOSTOMY TUBE PLACEMENT Right 2019   to clear up MRSA   There are no active problems to display for this  patient.   REFERRING PROVIDER: Beverley Evalene BIRCH, MD  REFERRING DIAG: L TKR  THERAPY DIAG:  Left knee pain, unspecified chronicity  Muscle weakness (generalized)  Difficulty in walking, not elsewhere classified  Rationale for Evaluation and Treatment: Rehabilitation  ONSET DATE: DOS  12/04/2023  SUBJECTIVE:   SUBJECTIVE STATEMENT: Pt is 9 weeks post op.   Pt has back pain and states it has been the same.  Pt saw PA on 10/28 and had x rays.  Pt states her back hasn't changed much since a few years ago, though a little more arthritis.  Pt receied a Kenalog IM injection and was prescribed a Dosepak.  Pt reports felt much better after the injection.  Pt feels better overall though not as good as she was after the injection. Pt has been performing her home exercises and pool exercises.  She hasn't performed her pool exercises this week.        PERTINENT HISTORY: L TKR  12/04/2023 Severe arthritis R knee which pt receives injections, plan for R TKR in December Arthritis in lumbar and R hip also HTN  PAIN:  NPRS:  0/10 L knee. 4/10 R knee, 6/10 lumbar   PRECAUTIONS: per dx; R knee OA--planning for R knee TKR   WEIGHT BEARING RESTRICTIONS: FWB  FALLS:  Has patient fallen in last 6 months? Yes. Number of falls 2, R ankle rolled with new shoes ;  fell in the bathroom when the towel bar broke off the wall  LIVING ENVIRONMENT: Lives with: lives with their spouse Lives in: 1 story home Stairs: 3 steps to enter home with 1 rail Has following equipment at home: FWW, crutches, SPC, 3 pronged cane  OCCUPATION: part time home care, primarily sitting  PLOF: Independent  PATIENT GOALS: to improve pain and mobility   OBJECTIVE:  Note: Objective measures were completed at Evaluation unless otherwise noted.  DIAGNOSTIC FINDINGS: Pt is post op.    LOWER EXTREMITY ROM:   ROM Right eval Left eval Left 9/8 Left 9/12 Left 9/19 Left 9/26 AROM Left 10/3 AROM  Hip flexion          Hip extension         Hip abduction         Hip adduction         Hip internal rotation         Hip external rotation         Knee flexion  69/88 AROM/PROM 97 deg 107 deg  AAROM 119 AROM after PROM and AAROM 125 130  Knee extension  0 deg AROM 0 deg AROM   0   Ankle dorsiflexion         Ankle plantarflexion         Ankle inversion         Ankle eversion          (Blank rows = not tested)                                                                                                                                TREATMENT:    Pt performed: Scifit bike x 5 mins lvl 1 LAQ 4# 3x10 Seated HS curls with RTB x10, GTB 2x10 Lateral band walks with RTB above knees 2x10 at wall TKE with GTB 2x10  PT spent time educating pt concerning HEP.  PT reviewed current HEP and updated HEP.  Pt received a HEP handout and was educated in correct form and appropriate frequency.       PATIENT EDUCATION:  Education details:  Educated pt in post op and protocol restrictions and expectations.  gait, HEP, exercise form, dx, prognosis, and POC.  PT answered pt's questions.  Person educated: Patient Education method: Explanation, Demonstration, Tactile cues, Verbal cues, Education comprehension: verbalized understanding, returned demonstration, verbal cues required, and tactile cues required  HOME EXERCISE PROGRAM: Access Code: [email or web address] URL: [email or web address] Date: 12/12/2023 Prepared by: Mose Minerva  Exercises - Supine Quadricep Sets  - 2 x daily - 7 x weekly - 2 sets - 10 reps - 5 seconds hold - Supine Gluteal Sets  - 2 x daily - 7 x weekly - 2 sets - 10 reps - 5 seconds hold - Supine Ankle Pumps  - 7 x weekly - Longsitting Heel Slide  - 2-3 x daily - 7 x weekly - 1 sets -  10 reps -Sitting with knee flexed for 3-5 mins comfortably at various times for 6-7 times/day - Supine Short Arc Quad  - 1 x daily - 7 x weekly - 2 sets - 10 reps - Supine Heel Slide with Strap  - 2 x  daily - 7 x weekly - 1-2 sets - 10 reps - Long Sitting Calf Stretch with Strap  - 2 x daily - 7 x weekly - 2-3 reps - 20 seconds hold - Seated Long Arc Quad  - 1 x daily - 7 x weekly - 2 sets - 10 reps - Heel Raises with Counter Support  - 1 x daily - 7 x weekly - 2 sets - 10 reps - Supine Active Straight Leg Raise  - 1 x daily - 7 x weekly - 2 sets - 10 reps - Sidelying Hip Abduction  - 1 x daily - 7 x weekly - 2 sets - 10 reps - Standing Terminal Knee Extension with Resistance  - 1 x daily - 5-6 x weekly - 2 sets - 10 reps  Updated HEP: - Seated Long Arc Quad with Ankle Weight  - 2-3 x weekly - 3 sets - 10 reps - Seated Hamstring Curl with Anchored Resistance  - 3-4 x weekly - 2-3 sets - 10 reps - Side Stepping with Resistance at Thighs and Counter Support  - 3 x weekly - 2-3 sets - 10 reps - Forward Step Up with Counter Support  - 5 x weekly - 2 sets - 10 reps   ASSESSMENT:  CLINICAL IMPRESSION: Pt is 9 weeks post op making great progress.  She has excellent knee AROM.  Pt has been progressing with strengthening exercises.  She recently had increased back pain and went to see MD.  She received a Kenalog IM injection and is currently on a Dosepak.  Her back is feeling better currently.  PT decreased volume of exercises today due to her back.  Pt reports having a limited number of visits per her insurance policy.  Today is actually her last authorized visit.  Pt is planning to have her R knee replaced in December.  PT will call her insurance to determine if they will authorize further visits.  PT spent time today reviewing and updating her HEP to ensure that she knows what to do at home leading up until her next knee surgery.  PT updated HEP and gave pt a HEP handout.  PT answered pt's questions.  She demonstrates good understanding of HEP.  Pt responded well to treatment having no c/o's after treatment.           OBJECTIVE IMPAIRMENTS: Abnormal gait, decreased activity tolerance, decreased  endurance, decreased knowledge of use of DME, decreased mobility, difficulty walking, decreased ROM, decreased strength, hypomobility, increased edema, impaired flexibility, and pain.   ACTIVITY LIMITATIONS: bending, sitting, standing, squatting, stairs, transfers, bed mobility, dressing, and locomotion level  PARTICIPATION LIMITATIONS: meal prep, cleaning, laundry, driving, shopping, and community activity  PERSONAL FACTORS: 1 comorbidity: Arthritis including in R knee are also affecting patient's functional outcome.   REHAB POTENTIAL: Good  CLINICAL DECISION MAKING: Stable/uncomplicated  EVALUATION COMPLEXITY: Low   GOALS:   SHORT TERM GOALS:  Pt will be independent and compliant with HEP for improved pain, ROM, strength, and function.  Baseline: Goal status: GOAL MET Target date:  01/01/24   2.  Pt will demo a good quad set and be able to perform a supine SLR independently for improved strength and stability  with gait.  Baseline:  Goal status: PROGRESSING Target date: 12/25/2023  3.  Pt will demo improved L knee flexion AROM to 95 deg for improved gait, stiffness, and mobility.  Baseline:  Goal status: GOAL MET 9/26 Target date:  01/08/2024  4.  Pt will demo improved quality of gait and ambulate with a SPC with good stability.  Baseline:  Goal status: INITIAL Target date:  01/08/2024   5.  Pt will be able to perform her normal transfers with no > than minimal difficulty.   Baseline:  Goal status: INITIAL    LONG TERM GOALS: Target date: 02/19/2024  Pt will demo improved R knee AROM to 0 - 115 deg for improved mobility and stiffness and for improved performance of stairs.  Baseline:  Goal status: GOAL MET  9/26  2.  Pt will ambulate with a normalized heel to toe gait without limping.  Baseline:  Goal status: INITIAL  3.   Pt will ambulate community distance without an AD without significant L knee pain and difficulty.  Baseline:  Goal status: INITIAL  4.    Pt will be able to perform household chores without significant L knee pain and difficulty.  Baseline:  Goal status: INITIAL  5.  Pt will demo improved strength to 4+/5 in R hip abd, 5/5 in Hip flexion, and 5/5 in knee ext and flexion for improved performance of daily functional mobility and to assist with returning to recreational activities.  Baseline:  Goal status: INITIAL  6.  Pt will be able to perform 4-5 steps with a reciprocal gait with the rail for improved performance of stairs.  Baseline:  Goal status: 50%  MET  10/24   PLAN:  PT FREQUENCY: 2-3x/wk x 1-2 weeks and 2x/wk afterwards   PT DURATION: 7 weeks  PLANNED INTERVENTIONS: 97164- PT Re-evaluation, 97750- Physical Performance Testing, 97110-Therapeutic exercises, 97530- Therapeutic activity, 97112- Neuromuscular re-education, 97535- Self Care, 02859- Manual therapy, [phone]- Gait training, [phone]- Aquatic Therapy, [phone]- Electrical stimulation (unattended), Patient/Family education, Balance training, Stair training, Taping, Joint mobilization, Spinal mobilization, Scar mobilization, DME instructions, Cryotherapy, and Moist heat  PLAN FOR NEXT SESSION:  Cont per TKR protocol.  Gait training.  PT will call insurance concerning authorization of further PT.  Pt may return for minimal PT or possible discharge depending on insurance response.       Leigh Minerva III PT, DPT 02/06/24 3:13 PM

## 2024-02-06 ENCOUNTER — Encounter (HOSPITAL_BASED_OUTPATIENT_CLINIC_OR_DEPARTMENT_OTHER): Payer: Self-pay | Admitting: Physical Therapy

## 2024-02-16 DIAGNOSIS — J40 Bronchitis, not specified as acute or chronic: Secondary | ICD-10-CM | POA: Diagnosis not present

## 2024-02-16 DIAGNOSIS — J040 Acute laryngitis: Secondary | ICD-10-CM | POA: Diagnosis not present

## 2024-02-23 ENCOUNTER — Ambulatory Visit (HOSPITAL_BASED_OUTPATIENT_CLINIC_OR_DEPARTMENT_OTHER): Admitting: Physical Therapy

## 2024-02-23 DIAGNOSIS — M1711 Unilateral primary osteoarthritis, right knee: Secondary | ICD-10-CM | POA: Diagnosis not present

## 2024-02-23 DIAGNOSIS — M25561 Pain in right knee: Secondary | ICD-10-CM | POA: Diagnosis not present

## 2024-02-27 ENCOUNTER — Ambulatory Visit (HOSPITAL_BASED_OUTPATIENT_CLINIC_OR_DEPARTMENT_OTHER): Admitting: Physical Therapy

## 2024-03-02 ENCOUNTER — Ambulatory Visit (HOSPITAL_BASED_OUTPATIENT_CLINIC_OR_DEPARTMENT_OTHER): Admitting: Physical Therapy

## 2024-03-09 ENCOUNTER — Ambulatory Visit (HOSPITAL_BASED_OUTPATIENT_CLINIC_OR_DEPARTMENT_OTHER): Admitting: Physical Therapy

## 2024-03-11 DIAGNOSIS — M1711 Unilateral primary osteoarthritis, right knee: Secondary | ICD-10-CM | POA: Diagnosis not present

## 2024-03-11 DIAGNOSIS — G8918 Other acute postprocedural pain: Secondary | ICD-10-CM | POA: Diagnosis not present

## 2024-03-18 ENCOUNTER — Ambulatory Visit (HOSPITAL_BASED_OUTPATIENT_CLINIC_OR_DEPARTMENT_OTHER): Attending: Orthopedic Surgery | Admitting: Physical Therapy

## 2024-03-18 DIAGNOSIS — R262 Difficulty in walking, not elsewhere classified: Secondary | ICD-10-CM

## 2024-03-18 DIAGNOSIS — M6281 Muscle weakness (generalized): Secondary | ICD-10-CM | POA: Diagnosis present

## 2024-03-18 DIAGNOSIS — M25661 Stiffness of right knee, not elsewhere classified: Secondary | ICD-10-CM | POA: Insufficient documentation

## 2024-03-18 DIAGNOSIS — Z9089 Acquired absence of other organs: Secondary | ICD-10-CM | POA: Diagnosis not present

## 2024-03-18 DIAGNOSIS — H7292 Unspecified perforation of tympanic membrane, left ear: Secondary | ICD-10-CM | POA: Diagnosis not present

## 2024-03-18 DIAGNOSIS — M25561 Pain in right knee: Secondary | ICD-10-CM

## 2024-03-18 DIAGNOSIS — H6991 Unspecified Eustachian tube disorder, right ear: Secondary | ICD-10-CM | POA: Diagnosis not present

## 2024-03-18 DIAGNOSIS — H90A31 Mixed conductive and sensorineural hearing loss, unilateral, right ear with restricted hearing on the contralateral side: Secondary | ICD-10-CM | POA: Diagnosis not present

## 2024-03-18 DIAGNOSIS — M25562 Pain in left knee: Secondary | ICD-10-CM | POA: Insufficient documentation

## 2024-03-18 DIAGNOSIS — H8101 Meniere's disease, right ear: Secondary | ICD-10-CM | POA: Diagnosis not present

## 2024-03-18 DIAGNOSIS — H6531 Chronic mucoid otitis media, right ear: Secondary | ICD-10-CM | POA: Diagnosis not present

## 2024-03-18 NOTE — Therapy (Signed)
 OUTPATIENT PHYSICAL THERAPY LOWER EXTREMITY EVALUATION   Patient Name: Laura Mills MRN: [id number] DOB:[birth year], 63 y.o., female Today's Date: 03/19/2024  END OF SESSION:  PT End of Session - 03/18/24 1411     Visit Number 1    Number of Visits 22    Date for Recertification  05/27/24    Authorization Type AETNA    PT Start Time 1411    PT Stop Time 1502    PT Time Calculation (min) 51 min    Activity Tolerance Patient tolerated treatment well    Behavior During Therapy [facility] for tasks assessed/performed          Past Medical History:  Diagnosis Date   Allergy    Arthritis    LUMBAR SPINE AND RIGHT HIP   Cataract    GERD (gastroesophageal reflux disease)    History of kidney stones 10/2012   Hyperlipidemia    Hypertension    Renal stone    Past Surgical History:  Procedure Laterality Date   ABDOMINAL HYSTERECTOMY  2000   CHOLECYSTECTOMY  04/09/2007   CYSTOSCOPY WITH URETEROSCOPY Right 10/02/2012   Procedure: CYSTOSCOPY WITH RIGHT RETROGRADE AND STENT PLACEMENT ;  Surgeon: Donnice Gwenyth Brooks, MD;  Location: [facility];  Service: Urology;  Laterality: Right;   CYSTOSCOPY WITH URETEROSCOPY Right 11/13/2012   Procedure: RIGHT URETEROSCOPY;  Surgeon: Donnice Gwenyth Brooks, MD;  Location: [facility];  Service: Urology;  Laterality: Right;  with Stent   CYSTOSCOPY/URETEROSCOPY/HOLMIUM LASER/STENT PLACEMENT Right 05/31/2022   Procedure: CYSTOSCOPY RIGHT URETEROSCOPY/HOLMIUM LASER/STENT PLACEMENT;  Surgeon: Brooks Donnice, MD;  Location: [facility];  Service: Urology;  Laterality: Right;  60 MINS   LITHOTRIPSY  10/06/2012   PUBOVAGINAL SLING N/A 04/15/2017   Procedure: CARLOYN GLADE;  Surgeon: Brooks Donnice, MD;  Location: [facility];  Service: Urology;  Laterality: N/A;  NEEDS 90 MIN FOR PROCEDURE   ROTATOR CUFF REPAIR Left    TYMPANOSTOMY TUBE PLACEMENT Right 2019   to clear up MRSA   There are no active problems to display for this patient.   REFERRING  PROVIDER: Beverley Evalene BIRCH, MD   REFERRING DIAG: presence of R artificial knee joint  L TKR   THERAPY DIAG:  Pain in both knees, unspecified chronicity  Stiffness of right knee, not elsewhere classified  Muscle weakness (generalized)  Difficulty in walking, not elsewhere classified  Rationale for Evaluation and Treatment: Rehabilitation  ONSET DATE: DOS  03/11/24  SUBJECTIVE:   SUBJECTIVE STATEMENT: Pt has had bilat knee pain for at least 2.5 years.  Pt performed aquatic exercises to help.  Pt underwent L TKR on 12/04/23.  Pt received PT from 12/11/23 - 02/05/24 for L TKR and made excellent progress. Pt underwent R TKR on 03/11/24.  Pt states her R knee is hurting a lot and she has been in tears.  Pt is using the CPM and has increased to 95 deg.  She is having much difficulty lifting R LE.  She has difficulty with donning shoe.  Pt is limited with functional mobility skills including ambulation and transfers.  Pt hasn't used the leg extender as much to lift LE with transfers today. Pt is using the bone foam pad, though not everyday. Pt had a skin tear from the compression stocking.  Pt saw MD on 12/8 due to having some bleeding MD stated her knee looks good.   PERTINENT HISTORY: R TKR 03/11/24 L TKR  12/04/2023 Arthritis in lumbar and R hip also HTN  PAIN:  R knee  6-7/10 best, 9/10 current, 10/10 worst   PRECAUTIONS: Other: bilat TKR    WEIGHT BEARING RESTRICTIONS: none indicated  FALLS:  Has patient fallen in last 6 months? Yes. Number of falls 4: R ankle rolled with new shoes ;  fell in the bathroom when the towel bar broke off the wall   on wet leaves, walking up a ramp  LIVING ENVIRONMENT: Lives with: lives with their spouse Lives in: 1 story home Stairs: 3 steps to enter home with 1 rail Has following equipment at home: FWW, crutches, SPC, 3 pronged cane  OCCUPATION: part time home care, primarily sitting   PLOF: Independent  PATIENT GOALS: to improve pain and  mobility   OBJECTIVE:  Note: Objective measures were completed at Evaluation unless otherwise noted.  DIAGNOSTIC FINDINGS: Pt is post op.   PATIENT SURVEYS:  LEFS:  10/80  COGNITION: Overall cognitive status: Within functional limits for tasks assessed     OBSERVATION: Pt has an aquacell bandage over incision.  Aquacell has no drainage just a tiny spot on prox to mid drainage.  Pt had 2 band-aids over 2 spots on R LE distal to bandage.  MD is aware of those spots.  Pt has bruising in medial thigh and medial calf.    LOWER EXTREMITY ROM:   ROM Right eval Left eval  Hip flexion    Hip extension    Hip abduction    Hip adduction    Hip internal rotation    Hip external rotation    Knee flexion 81 PROM; 77 AROM   Knee extension 4 AROM   Ankle dorsiflexion    Ankle plantarflexion    Ankle inversion    Ankle eversion     (Blank rows = not tested)    LOWER EXTREMITY MMT:   Strength not tested due to recent surgery and post op considerations.   FUNCTIONAL TESTS:  Pt required assistance with lifting LE with bed mobility.   GAIT: Comments: pt ambulating with FWW with a heel to toe gait with limited toe off and increased UE Wb'ing through walker.                                                                                                                                TREATMENT:   Pt performed quad sets with a 5 sec hold x 10 reps, glute sets with a 5 sec hold x 10 reps, ankle pumps x 20 reps, and supine and longsitting heel slides.  PT also educated pt in sitting for short bouts of 3-5 mins with knee comfortably flexed at various times t/o the day, though explained the importance of sitting with knee supported in extension at elevated.  PT instructed pt in proper positioning.  Pt received a HEP handout and was educated in correct form and appropriate frequency.  PT instructed in appropriate ROM for knee flexion.     PATIENT EDUCATION:  Education details:  Instructed  pt  to not put a pillow under knee.  Educated pt in post op protocol restrictions and expectations.  HEP, objective findings, dx, prognosis, and POC.  PT answered pt's questions.  Person educated: Patient Education method: Explanation, Demonstration, Tactile cues, Verbal cues, and Handouts Education comprehension: verbalized understanding, returned demonstration, verbal cues required, and tactile cues required  HOME EXERCISE PROGRAM: Exercises - Supine Quadricep Sets  - 2 x daily - 7 x weekly - 2 sets - 10 reps - 5 seconds hold - Supine Gluteal Sets  - 2 x daily - 7 x weekly - 2 sets - 10 reps - 5 seconds hold - Supine Ankle Pumps  - 7 x weekly - Longsitting Heel Slide  - 2-3 x daily - 7 x weekly - 1 sets - 10 reps -Sitting with knee flexed for 3-5 mins comfortably at various times for 6-7 times/day   ASSESSMENT:  CLINICAL IMPRESSION: Patient is a 63 y.o. female 1 week s/p R TKR presenting to the clinic with expected post op findings of R knee pain, limited R knee ROM, muscle weakness in R LE, and difficulty in walking.  Pt states she is in a lot of pain.  Pt received PT for L TKR in Sept and October and made excellent progress.  Pt has arthritis in lumbar and R hip also.  She is limited with performing her ADLs, IADLs, and functional mobility skills.  Pt is ambulating with a FWW.  She has been using the CPM and ice.  She has used the bone foam pad some.  She will benefit from skilled PT per protocol to address impairments and improve overall function.    OBJECTIVE IMPAIRMENTS: Abnormal gait, decreased activity tolerance, decreased endurance, decreased knowledge of use of DME, decreased mobility, difficulty walking, decreased ROM, decreased strength, hypomobility, increased edema, impaired flexibility, and pain.    ACTIVITY LIMITATIONS: bending, sitting, standing, squatting, stairs, transfers, bed mobility, dressing, and locomotion level   PARTICIPATION LIMITATIONS: meal prep, cleaning, laundry,  driving, shopping, and community activity   PERSONAL FACTORS: 1-2 comorbidity: Arthritis and L TKR are also affecting patient's functional outcome.   REHAB POTENTIAL: Good  CLINICAL DECISION MAKING: Stable/uncomplicated  EVALUATION COMPLEXITY: Low   GOALS:   SHORT TERM GOALS:  Pt will be independen and compliant with HEP for improved pain, ROM, strength, and function. Baseline: Goal status: INITIAL Target date:  04/08/2024  2.  Pt will demo a good quad set and be able to perform a supine SLR independently for improved strength and stability with gait.  Baseline:  Goal status: INITIAL Target date:  04/01/2021  3.  Pt will demo improved L knee  AROM to 0-95 deg for improved gait, stiffness, and mobility.  Baseline:  Goal status: INITIAL Target date:  04/15/2024  4.  Pt will demo improved quality of gait and ambulate with a SPC with good stability.  Baseline:  Goal status: INITIAL Target date:  04/15/2024  5.   Pt will be able to perform her normal transfers with no > than minimal difficulty.   Baseline:  Goal status: INITIAL Target date:  04/29/2024    LONG TERM GOALS: Target date: 05/27/2024   Pt will demo improved R knee AROM to 0 - 120 deg for improved mobility and stiffness and for improved performance of stairs.  Baseline:  Goal status: INITIAL  2.  Pt will ambulate with a normalized heel to toe gait without limping.  Baseline:  Goal status: INITIAL  3.  Pt will  ambulate community distance without an AD without significant knee pain and difficulty.  Baseline:  Goal status: INITIAL  4.  Pt will be able to perform household chores without significant knee pain and difficulty.  Baseline:  Goal status: INITIAL  5.   Pt will demo improved strength to 4+/5 in R hip abd, 5/5 in Hip flexion, and 5/5 in knee ext and flexion for improved performance of daily functional mobility and to assist with returning to recreational activities.  Baseline:  Goal status:  INITIAL  6.  Pt will be able to perform 4-5 steps with a reciprocal gait with the rail for improved performance of stairs.  Baseline:  Goal status: INITIAL   PLAN:  PT FREQUENCY: 2-3x/wk x 3 weeks and 2x/wk afterwards   PT DURATION: 10 weeks   PLANNED INTERVENTIONS: 97164- PT Re-evaluation, 97750- Physical Performance Testing, 97110-Therapeutic exercises, 97530- Therapeutic activity, 97112- Neuromuscular re-education, 97535- Self Care, 02859- Manual therapy, [phone]- Gait training, [phone]- Aquatic Therapy, [phone]- Electrical stimulation (unattended), Patient/Family education, Balance training, Stair training, Taping, Joint mobilization, Spinal mobilization, Scar mobilization, DME instructions, Cryotherapy, and Moist heat  PLAN FOR NEXT SESSION: Cont per TKR protocol.  Cont with gait training and ice.     Leigh Minerva III PT, DPT 03/20/2024 6:34 AM

## 2024-03-19 ENCOUNTER — Other Ambulatory Visit: Payer: Self-pay

## 2024-03-19 ENCOUNTER — Encounter (HOSPITAL_BASED_OUTPATIENT_CLINIC_OR_DEPARTMENT_OTHER): Payer: Self-pay | Admitting: Physical Therapy

## 2024-03-23 ENCOUNTER — Ambulatory Visit (HOSPITAL_BASED_OUTPATIENT_CLINIC_OR_DEPARTMENT_OTHER): Admitting: Physical Therapy

## 2024-03-23 ENCOUNTER — Encounter (HOSPITAL_BASED_OUTPATIENT_CLINIC_OR_DEPARTMENT_OTHER): Payer: Self-pay | Admitting: Physical Therapy

## 2024-03-23 DIAGNOSIS — M6281 Muscle weakness (generalized): Secondary | ICD-10-CM

## 2024-03-23 DIAGNOSIS — M25561 Pain in right knee: Secondary | ICD-10-CM | POA: Diagnosis not present

## 2024-03-23 DIAGNOSIS — M25562 Pain in left knee: Secondary | ICD-10-CM

## 2024-03-23 DIAGNOSIS — R262 Difficulty in walking, not elsewhere classified: Secondary | ICD-10-CM

## 2024-03-23 DIAGNOSIS — M25661 Stiffness of right knee, not elsewhere classified: Secondary | ICD-10-CM

## 2024-03-23 NOTE — Therapy (Signed)
 OUTPATIENT PHYSICAL THERAPY LOWER EXTREMITY TREATMENT   Patient Name: Laura Mills MRN: [id number] DOB:[birth year], 63 y.o., female Today's Date: 03/24/2024  END OF SESSION:  PT End of Session - 03/23/24 1518     Visit Number 2    Number of Visits 22    Date for Recertification  05/27/24    Authorization Type AETNA    PT Start Time 1454    PT Stop Time 1537    PT Time Calculation (min) 43 min    Activity Tolerance Patient tolerated treatment well    Behavior During Therapy [facility] for tasks assessed/performed           Past Medical History:  Diagnosis Date   Allergy    Arthritis    LUMBAR SPINE AND RIGHT HIP   Cataract    GERD (gastroesophageal reflux disease)    History of kidney stones 10/2012   Hyperlipidemia    Hypertension    Renal stone    Past Surgical History:  Procedure Laterality Date   ABDOMINAL HYSTERECTOMY  2000   CHOLECYSTECTOMY  04/09/2007   CYSTOSCOPY WITH URETEROSCOPY Right 10/02/2012   Procedure: CYSTOSCOPY WITH RIGHT RETROGRADE AND STENT PLACEMENT ;  Surgeon: Donnice Gwenyth Brooks, MD;  Location: [facility];  Service: Urology;  Laterality: Right;   CYSTOSCOPY WITH URETEROSCOPY Right 11/13/2012   Procedure: RIGHT URETEROSCOPY;  Surgeon: Donnice Gwenyth Brooks, MD;  Location: [facility];  Service: Urology;  Laterality: Right;  with Stent   CYSTOSCOPY/URETEROSCOPY/HOLMIUM LASER/STENT PLACEMENT Right 05/31/2022   Procedure: CYSTOSCOPY RIGHT URETEROSCOPY/HOLMIUM LASER/STENT PLACEMENT;  Surgeon: Brooks Donnice, MD;  Location: [facility];  Service: Urology;  Laterality: Right;  60 MINS   LITHOTRIPSY  10/06/2012   PUBOVAGINAL SLING N/A 04/15/2017   Procedure: CARLOYN GLADE;  Surgeon: Brooks Donnice, MD;  Location: [facility];  Service: Urology;  Laterality: N/A;  NEEDS 90 MIN FOR PROCEDURE   ROTATOR CUFF REPAIR Left    TYMPANOSTOMY TUBE PLACEMENT Right 2019   to clear up MRSA   There are no active problems to display for this patient.   REFERRING  PROVIDER: Beverley Evalene BIRCH, MD   REFERRING DIAG: presence of R artificial knee joint  L TKR   THERAPY DIAG:  Pain in both knees, unspecified chronicity  Stiffness of right knee, not elsewhere classified  Muscle weakness (generalized)  Difficulty in walking, not elsewhere classified  Rationale for Evaluation and Treatment: Rehabilitation  ONSET DATE: DOS  03/11/24  SUBJECTIVE:   SUBJECTIVE STATEMENT: Pt is 1 week and 5 days s/p R TKR.  Pt denies any adverse effects after prior treatment.  Pt is not wearing the compression stockings during PT though has been wearing them.  Pt is able to perform bed mobility without the leg lifter.  Pt has been performing her HEP.  Pt states her knee has buckled a few times with ambulation.  She is using her walker and did not fall.  Pt reports the surgery site hurts and burns.  Pt went into a couple of stores yesterday and pushed a shopping cart.  She had increased pain with walking in the stores.     PERTINENT HISTORY: R TKR 03/11/24 L TKR  12/04/2023 Arthritis in lumbar and R hip also HTN  PAIN:  R knee  6-7/10 current   PRECAUTIONS: Other: bilat TKR    WEIGHT BEARING RESTRICTIONS: none indicated  FALLS:  Has patient fallen in last 6 months? Yes. Number of falls 4: R ankle rolled with new shoes ;  fell in  the bathroom when the towel bar broke off the wall   on wet leaves, walking up a ramp  LIVING ENVIRONMENT: Lives with: lives with their spouse Lives in: 1 story home Stairs: 3 steps to enter home with 1 rail Has following equipment at home: FWW, crutches, SPC, 3 pronged cane  OCCUPATION: part time home care, primarily sitting   PLOF: Independent  PATIENT GOALS: to improve pain and mobility   OBJECTIVE:  Note: Objective measures were completed at Evaluation unless otherwise noted.  DIAGNOSTIC FINDINGS: Pt is post op.   PATIENT SURVEYS:  LEFS:  10/80  COGNITION: Overall cognitive status: Within functional limits for  tasks assessed     OBSERVATION: Pt has an aquacell bandage over incision.  Aquacell has no drainage just a tiny spot on prox to mid drainage.  Pt has no band aids today and the 2 spots on R LE distal to bandage are healing well.  MD is aware of those spots.  Bruising has improved.    LOWER EXTREMITY ROM:   ROM Right eval Left eval  Hip flexion    Hip extension    Hip abduction    Hip adduction    Hip internal rotation    Hip external rotation    Knee flexion 81 PROM; 77 AROM   Knee extension 4 AROM   Ankle dorsiflexion    Ankle plantarflexion    Ankle inversion    Ankle eversion     (Blank rows = not tested)    LOWER EXTREMITY MMT:   Strength not tested due to recent surgery and post op considerations.   FUNCTIONAL TESTS:  Pt required assistance with lifting LE with bed mobility.   GAIT: Comments: pt ambulating with FWW with a heel to toe gait with limited toe off and increased UE Wb'ing through walker.                                                                                                                                TREATMENT:    quad sets with a 5 sec hold x 10 reps Glute sets with a 5 sec hold x 10 reps supine SLR x 10 with min assist SAQ 2x10 Supine heel slides 2x10 Seated heel slides 2x10  Pt received R knee flexion and extension PROM in supine per pt and tissue tolerance.    Updated HEP and gave pt a HEP handout.  PT educated pt in correct form and appropriate frequency.  R knee extension AROM:  1 deg     PATIENT EDUCATION:  Education details:  Instructed pt to not put a pillow under knee.  Educated pt in post op protocol restrictions and expectations.  HEP, objective findings, dx, prognosis, exercise form, and POC.  PT answered pt's questions.  Person educated: Patient Education method: Explanation, Demonstration, Tactile cues, Verbal cues, and Handouts Education comprehension: verbalized understanding, returned demonstration, verbal cues  required, and tactile cues required  HOME EXERCISE PROGRAM: Exercises - Supine Quadricep Sets  - 2 x daily - 7 x weekly - 2 sets - 10 reps - 5 seconds hold - Supine Gluteal Sets  - 2 x daily - 7 x weekly - 2 sets - 10 reps - 5 seconds hold - Supine Ankle Pumps  - 7 x weekly - Longsitting Heel Slide  - 2-3 x daily - 7 x weekly - 1 sets - 10 reps -Sitting with knee flexed for 3-5 mins comfortably at various times for 6-7 times/day   Access Code: [email or web address] URL: [email or web address] Date: 03/23/2024 Prepared by: Mose Minerva  Exercises - Supine Short Arc Quad  - 1 x daily - 7 x weekly - 2 sets - 10 reps  ASSESSMENT:  CLINICAL IMPRESSION: Pt is improving with knee flexion and extension ROM based on visual assessment and goniometric measurements.  PT reviewed and pt performed HEP.  She performed exercises well and had good tolerance with exercises.  Pt performed supine SLR through a minimal range without assistance.  PT gave assistance for supine SLR for improved performance and increased height.  PT performed PROM to R knee per pt and tissue tolerance and she tolerated it well.  The proximal edges of her aquacell bandage were folding up a little.  PT placed a tegaderm over proximal aquacell bandage to keep it contact with skin and to ensure that incision would stay covered.  She responded well to treatment reporting some soreness though had no increased pain after treatment.   OBJECTIVE IMPAIRMENTS: Abnormal gait, decreased activity tolerance, decreased endurance, decreased knowledge of use of DME, decreased mobility, difficulty walking, decreased ROM, decreased strength, hypomobility, increased edema, impaired flexibility, and pain.    ACTIVITY LIMITATIONS: bending, sitting, standing, squatting, stairs, transfers, bed mobility, dressing, and locomotion level   PARTICIPATION LIMITATIONS: meal prep, cleaning, laundry, driving, shopping, and community activity   PERSONAL FACTORS:  1-2 comorbidity: Arthritis and L TKR are also affecting patient's functional outcome.   REHAB POTENTIAL: Good  CLINICAL DECISION MAKING: Stable/uncomplicated  EVALUATION COMPLEXITY: Low   GOALS:   SHORT TERM GOALS:  Pt will be independen and compliant with HEP for improved pain, ROM, strength, and function. Baseline: Goal status: INITIAL Target date:  04/08/2024  2.  Pt will demo a good quad set and be able to perform a supine SLR independently for improved strength and stability with gait.  Baseline:  Goal status: INITIAL Target date:  04/01/2021  3.  Pt will demo improved L knee  AROM to 0-95 deg for improved gait, stiffness, and mobility.  Baseline:  Goal status: INITIAL Target date:  04/15/2024  4.  Pt will demo improved quality of gait and ambulate with a SPC with good stability.  Baseline:  Goal status: INITIAL Target date:  04/15/2024  5.   Pt will be able to perform her normal transfers with no > than minimal difficulty.   Baseline:  Goal status: INITIAL Target date:  04/29/2024    LONG TERM GOALS: Target date: 05/27/2024   Pt will demo improved R knee AROM to 0 - 120 deg for improved mobility and stiffness and for improved performance of stairs.  Baseline:  Goal status: INITIAL  2.  Pt will ambulate with a normalized heel to toe gait without limping.  Baseline:  Goal status: INITIAL  3.  Pt will ambulate community distance without an AD without significant knee pain and difficulty.  Baseline:  Goal status: INITIAL  4.  Pt will be able to  perform household chores without significant knee pain and difficulty.  Baseline:  Goal status: INITIAL  5.   Pt will demo improved strength to 4+/5 in R hip abd, 5/5 in Hip flexion, and 5/5 in knee ext and flexion for improved performance of daily functional mobility and to assist with returning to recreational activities.  Baseline:  Goal status: INITIAL  6.  Pt will be able to perform 4-5 steps with a reciprocal gait  with the rail for improved performance of stairs.  Baseline:  Goal status: INITIAL   PLAN:  PT FREQUENCY: 2-3x/wk x 3 weeks and 2x/wk afterwards   PT DURATION: 10 weeks   PLANNED INTERVENTIONS: 97164- PT Re-evaluation, 97750- Physical Performance Testing, 97110-Therapeutic exercises, 97530- Therapeutic activity, 97112- Neuromuscular re-education, 97535- Self Care, 02859- Manual therapy, [phone]- Gait training, [phone]- Aquatic Therapy, [phone]- Electrical stimulation (unattended), Patient/Family education, Balance training, Stair training, Taping, Joint mobilization, Spinal mobilization, Scar mobilization, DME instructions, Cryotherapy, and Moist heat  PLAN FOR NEXT SESSION: Cont per TKR protocol.  Cont with gait training and ice.     Leigh Minerva III PT, DPT 03/24/2024 9:49 PM

## 2024-03-24 DIAGNOSIS — Z01419 Encounter for gynecological examination (general) (routine) without abnormal findings: Secondary | ICD-10-CM | POA: Diagnosis not present

## 2024-03-24 DIAGNOSIS — Z96652 Presence of left artificial knee joint: Secondary | ICD-10-CM | POA: Diagnosis not present

## 2024-03-24 DIAGNOSIS — N951 Menopausal and female climacteric states: Secondary | ICD-10-CM | POA: Diagnosis not present

## 2024-03-24 DIAGNOSIS — R6882 Decreased libido: Secondary | ICD-10-CM | POA: Diagnosis not present

## 2024-03-24 DIAGNOSIS — M25561 Pain in right knee: Secondary | ICD-10-CM | POA: Diagnosis not present

## 2024-03-24 DIAGNOSIS — N952 Postmenopausal atrophic vaginitis: Secondary | ICD-10-CM | POA: Diagnosis not present

## 2024-03-24 DIAGNOSIS — A6000 Herpesviral infection of urogenital system, unspecified: Secondary | ICD-10-CM | POA: Diagnosis not present

## 2024-03-24 DIAGNOSIS — Z6828 Body mass index (BMI) 28.0-28.9, adult: Secondary | ICD-10-CM | POA: Diagnosis not present

## 2024-03-25 ENCOUNTER — Ambulatory Visit (HOSPITAL_BASED_OUTPATIENT_CLINIC_OR_DEPARTMENT_OTHER): Admitting: Physical Therapy

## 2024-03-25 ENCOUNTER — Encounter (HOSPITAL_BASED_OUTPATIENT_CLINIC_OR_DEPARTMENT_OTHER): Payer: Self-pay | Admitting: Physical Therapy

## 2024-03-25 DIAGNOSIS — R262 Difficulty in walking, not elsewhere classified: Secondary | ICD-10-CM

## 2024-03-25 DIAGNOSIS — M6281 Muscle weakness (generalized): Secondary | ICD-10-CM

## 2024-03-25 DIAGNOSIS — M25561 Pain in right knee: Secondary | ICD-10-CM

## 2024-03-25 DIAGNOSIS — M25661 Stiffness of right knee, not elsewhere classified: Secondary | ICD-10-CM

## 2024-03-25 NOTE — Therapy (Signed)
 OUTPATIENT PHYSICAL THERAPY LOWER EXTREMITY TREATMENT   Patient Name: Laura Mills MRN: [id number] DOB:12/25/1960, 63 y.o., female Today's Date: 03/25/2024  END OF SESSION:  PT End of Session - 03/25/24 0959     Visit Number 3    Number of Visits 22    Date for Recertification  05/27/24    Authorization Type AETNA    PT Start Time 9062    PT Stop Time 1026    PT Time Calculation (min) 49 min    Activity Tolerance Patient tolerated treatment well    Behavior During Therapy [facility] for tasks assessed/performed            Past Medical History:  Diagnosis Date   Allergy    Arthritis    LUMBAR SPINE AND RIGHT HIP   Cataract    GERD (gastroesophageal reflux disease)    History of kidney stones 10/2012   Hyperlipidemia    Hypertension    Renal stone    Past Surgical History:  Procedure Laterality Date   ABDOMINAL HYSTERECTOMY  2000   CHOLECYSTECTOMY  04/09/2007   CYSTOSCOPY WITH URETEROSCOPY Right 10/02/2012   Procedure: CYSTOSCOPY WITH RIGHT RETROGRADE AND STENT PLACEMENT ;  Surgeon: Donnice Gwenyth Brooks, MD;  Location: [facility];  Service: Urology;  Laterality: Right;   CYSTOSCOPY WITH URETEROSCOPY Right 11/13/2012   Procedure: RIGHT URETEROSCOPY;  Surgeon: Donnice Gwenyth Brooks, MD;  Location: [facility];  Service: Urology;  Laterality: Right;  with Stent   CYSTOSCOPY/URETEROSCOPY/HOLMIUM LASER/STENT PLACEMENT Right 05/31/2022   Procedure: CYSTOSCOPY RIGHT URETEROSCOPY/HOLMIUM LASER/STENT PLACEMENT;  Surgeon: Brooks Donnice, MD;  Location: [facility];  Service: Urology;  Laterality: Right;  60 MINS   LITHOTRIPSY  10/06/2012   PUBOVAGINAL SLING N/A 04/15/2017   Procedure: CARLOYN GLADE;  Surgeon: Brooks Donnice, MD;  Location: [facility];  Service: Urology;  Laterality: N/A;  NEEDS 90 MIN FOR PROCEDURE   ROTATOR CUFF REPAIR Left    TYMPANOSTOMY TUBE PLACEMENT Right 2019   to clear up MRSA   There are no active problems to display for this patient.   REFERRING  PROVIDER: Beverley Evalene BIRCH, MD   REFERRING DIAG: presence of R artificial knee joint  L TKR   THERAPY DIAG:  Pain in both knees, unspecified chronicity  Stiffness of right knee, not elsewhere classified  Muscle weakness (generalized)  Difficulty in walking, not elsewhere classified  Rationale for Evaluation and Treatment: Rehabilitation  ONSET DATE: DOS  03/11/24  SUBJECTIVE:   SUBJECTIVE STATEMENT: Pt is 2 weeks s/p R TKR.  Pt had a good appt with MD and he is pleased with progress.  She had x rays which looked good.  He removed the aquacel bandage.  Pt has internal stitches though had no external sutures.  MD used glue for the incision.  Pt reports she is having a lot of pain at incision area and in knee.  Pt is using tylenol  and Journavx for medication.  She is not taking the oxy now.  Pt denies any adverse effects after prior treatment.     PERTINENT HISTORY: R TKR 03/11/24 L TKR  12/04/2023 Arthritis in lumbar and R hip also HTN  PAIN:  R knee, feels more like it's the skin. Skin feels tight  6/10 current   PRECAUTIONS: Other: bilat TKR    WEIGHT BEARING RESTRICTIONS: none indicated  FALLS:  Has patient fallen in last 6 months? Yes. Number of falls 4: R ankle rolled with new shoes ;  fell in the bathroom when the  towel bar broke off the wall   on wet leaves, walking up a ramp  LIVING ENVIRONMENT: Lives with: lives with their spouse Lives in: 1 story home Stairs: 3 steps to enter home with 1 rail Has following equipment at home: FWW, crutches, SPC, 3 pronged cane  OCCUPATION: part time home care, primarily sitting   PLOF: Independent  PATIENT GOALS: to improve pain and mobility   OBJECTIVE:  Note: Objective measures were completed at Evaluation unless otherwise noted.  DIAGNOSTIC FINDINGS: Pt is post op.   PATIENT SURVEYS:  LEFS:  10/80  COGNITION: Overall cognitive status: Within functional limits for tasks assessed     OBSERVATION: Pt has an  aquacell bandage over incision.  Aquacell has no drainage just a tiny spot on prox to mid drainage.  Pt has no band aids today and the 2 spots on R LE distal to bandage are healing well.  MD is aware of those spots.  Bruising has improved.    LOWER EXTREMITY ROM:   ROM Right eval Left eval Right 12/18  Hip flexion     Hip extension     Hip abduction     Hip adduction     Hip internal rotation     Hip external rotation     Knee flexion 81 PROM; 77 AROM  94 AROM  Knee extension 4 AROM  1 AROM  Ankle dorsiflexion     Ankle plantarflexion     Ankle inversion     Ankle eversion      (Blank rows = not tested)    LOWER EXTREMITY MMT:   Strength not tested due to recent surgery and post op considerations.   FUNCTIONAL TESTS:  Pt required assistance with lifting LE with bed mobility.   GAIT: Comments: pt ambulating with FWW with a heel to toe gait with limited toe off and increased UE Wb'ing through walker.                                                                                                                                TREATMENT:    Assessed knee ROM  Scifit recumbent bike rocking back and forth x 4 mins quad sets with a 5 sec hold x 10 reps supine SLR x 10 with min assist SAQ 2x10 longsitting gastroc stretch 3x20-30 seconds Supine heel slides with starp Pt received R knee flexion and extension PROM in supine per pt and tissue tolerance.    Updated HEP and gave pt a HEP handout.  PT educated pt in correct form and appropriate frequency.      PATIENT EDUCATION:  Education details:  Educated pt in post op protocol restrictions and expectations.  HEP, objective findings, dx, prognosis, exercise form, and POC.  PT answered pt's questions.  Person educated: Patient and husband Education method: Explanation, Demonstration, Tactile cues, Verbal cues, and Handouts Education comprehension: verbalized understanding, returned demonstration, verbal cues required, and  tactile cues  required  HOME EXERCISE PROGRAM: Exercises - Supine Quadricep Sets  - 2 x daily - 7 x weekly - 2 sets - 10 reps - 5 seconds hold - Supine Gluteal Sets  - 2 x daily - 7 x weekly - 2 sets - 10 reps - 5 seconds hold - Supine Ankle Pumps  - 7 x weekly - Longsitting Heel Slide  - 2-3 x daily - 7 x weekly - 1 sets - 10 reps -Sitting with knee flexed for 3-5 mins comfortably at various times for 6-7 times/day   Access Code: [email or web address] URL: [email or web address] Date: 03/23/2024 Prepared by: Mose Minerva  Exercises - Supine Short Arc Quad  - 1 x daily - 7 x weekly - 2 sets - 10 reps  Updated HEP: - Small Range Straight Leg Raise  - 2 x daily - 7 x weekly - 1 sets - 7-10 reps - Long Sitting Calf Stretch with Strap  - 2 x daily - 7 x weekly - 2-3 reps - 20 seconds hold - Supine Heel Slide with Strap  - 2 x daily - 7 x weekly - 2 sets - 10 reps  ASSESSMENT:  CLINICAL IMPRESSION: Pt is 2 weeks post op and reports MD being pleased with her progress.  She had her aquacel bandage removed and her incision is intact without any signs of infection.  Pt had no external stitches; MD used glue for the incision.  Pt is ambulating with a FWW.  Pt is making good progress with knee ROM.  PT progressed exercises per protocol and pt tolerated exercises well.  PT provided instruction and cuing for correct form with exercises.  Pt able to perform supine SLR though has improved form and decreased extensor lag with assistance from PT.  PT instructed pt and husband to perform supine SLR at home through a smaller range with husband's assistance.  PT updated HEP and gave pt a HEP handout.  Pt demonstrates good understanding of HEP.  Pt responded well to treatment having no c/o's or increased pain after treatment.  Pt will benefit from cont skilled PT per protocol to address impairments and goals and to assist in restoring desired level of function.       OBJECTIVE IMPAIRMENTS: Abnormal gait,  decreased activity tolerance, decreased endurance, decreased knowledge of use of DME, decreased mobility, difficulty walking, decreased ROM, decreased strength, hypomobility, increased edema, impaired flexibility, and pain.    ACTIVITY LIMITATIONS: bending, sitting, standing, squatting, stairs, transfers, bed mobility, dressing, and locomotion level   PARTICIPATION LIMITATIONS: meal prep, cleaning, laundry, driving, shopping, and community activity   PERSONAL FACTORS: 1-2 comorbidity: Arthritis and L TKR are also affecting patient's functional outcome.   REHAB POTENTIAL: Good  CLINICAL DECISION MAKING: Stable/uncomplicated  EVALUATION COMPLEXITY: Low   GOALS:   SHORT TERM GOALS:  Pt will be independen and compliant with HEP for improved pain, ROM, strength, and function. Baseline: Goal status: INITIAL Target date:  04/08/2024  2.  Pt will demo a good quad set and be able to perform a supine SLR independently for improved strength and stability with gait.  Baseline:  Goal status: INITIAL Target date:  04/01/2021  3.  Pt will demo improved L knee  AROM to 0-95 deg for improved gait, stiffness, and mobility.  Baseline:  Goal status: INITIAL Target date:  04/15/2024  4.  Pt will demo improved quality of gait and ambulate with a SPC with good stability.  Baseline:  Goal status: INITIAL Target date:  04/15/2024  5.   Pt will be able to perform her normal transfers with no > than minimal difficulty.   Baseline:  Goal status: INITIAL Target date:  04/29/2024    LONG TERM GOALS: Target date: 05/27/2024   Pt will demo improved R knee AROM to 0 - 120 deg for improved mobility and stiffness and for improved performance of stairs.  Baseline:  Goal status: INITIAL  2.  Pt will ambulate with a normalized heel to toe gait without limping.  Baseline:  Goal status: INITIAL  3.  Pt will ambulate community distance without an AD without significant knee pain and difficulty.  Baseline:   Goal status: INITIAL  4.  Pt will be able to perform household chores without significant knee pain and difficulty.  Baseline:  Goal status: INITIAL  5.   Pt will demo improved strength to 4+/5 in R hip abd, 5/5 in Hip flexion, and 5/5 in knee ext and flexion for improved performance of daily functional mobility and to assist with returning to recreational activities.  Baseline:  Goal status: INITIAL  6.  Pt will be able to perform 4-5 steps with a reciprocal gait with the rail for improved performance of stairs.  Baseline:  Goal status: INITIAL   PLAN:  PT FREQUENCY: 2-3x/wk x 3 weeks and 2x/wk afterwards   PT DURATION: 10 weeks   PLANNED INTERVENTIONS: 97164- PT Re-evaluation, 97750- Physical Performance Testing, 97110-Therapeutic exercises, 97530- Therapeutic activity, 97112- Neuromuscular re-education, 97535- Self Care, 02859- Manual therapy, [phone]- Gait training, [phone]- Aquatic Therapy, [phone]- Electrical stimulation (unattended), Patient/Family education, Balance training, Stair training, Taping, Joint mobilization, Spinal mobilization, Scar mobilization, DME instructions, Cryotherapy, and Moist heat  PLAN FOR NEXT SESSION: Cont per TKR protocol.  Cont with gait training and ice.     Leigh Minerva III PT, DPT 03/25/2024 11:49 AM

## 2024-03-31 ENCOUNTER — Encounter (HOSPITAL_BASED_OUTPATIENT_CLINIC_OR_DEPARTMENT_OTHER): Payer: Self-pay | Admitting: Physical Therapy

## 2024-03-31 ENCOUNTER — Ambulatory Visit (HOSPITAL_BASED_OUTPATIENT_CLINIC_OR_DEPARTMENT_OTHER): Admitting: Physical Therapy

## 2024-03-31 DIAGNOSIS — M6281 Muscle weakness (generalized): Secondary | ICD-10-CM

## 2024-03-31 DIAGNOSIS — R262 Difficulty in walking, not elsewhere classified: Secondary | ICD-10-CM

## 2024-03-31 DIAGNOSIS — M25561 Pain in right knee: Secondary | ICD-10-CM | POA: Diagnosis not present

## 2024-03-31 DIAGNOSIS — M25661 Stiffness of right knee, not elsewhere classified: Secondary | ICD-10-CM

## 2024-03-31 NOTE — Therapy (Signed)
 " OUTPATIENT PHYSICAL THERAPY LOWER EXTREMITY TREATMENT   Patient Name: Laura Mills MRN: [id number] DOB:[birth year], 63 y.o., female Today's Date: 03/31/2024  END OF SESSION:  PT End of Session - 03/31/24 1214     Visit Number 4    Number of Visits 22    Date for Recertification  05/27/24    Authorization Type AETNA    PT Start Time 1157    PT Stop Time 1241    PT Time Calculation (min) 44 min    Activity Tolerance Patient tolerated treatment well    Behavior During Therapy [facility] for tasks assessed/performed             Past Medical History:  Diagnosis Date   Allergy    Arthritis    LUMBAR SPINE AND RIGHT HIP   Cataract    GERD (gastroesophageal reflux disease)    History of kidney stones 10/2012   Hyperlipidemia    Hypertension    Renal stone    Past Surgical History:  Procedure Laterality Date   ABDOMINAL HYSTERECTOMY  2000   CHOLECYSTECTOMY  04/09/2007   CYSTOSCOPY WITH URETEROSCOPY Right 10/02/2012   Procedure: CYSTOSCOPY WITH RIGHT RETROGRADE AND STENT PLACEMENT ;  Surgeon: Donnice Gwenyth Brooks, MD;  Location: [facility];  Service: Urology;  Laterality: Right;   CYSTOSCOPY WITH URETEROSCOPY Right 11/13/2012   Procedure: RIGHT URETEROSCOPY;  Surgeon: Donnice Gwenyth Brooks, MD;  Location: [facility];  Service: Urology;  Laterality: Right;  with Stent   CYSTOSCOPY/URETEROSCOPY/HOLMIUM LASER/STENT PLACEMENT Right 05/31/2022   Procedure: CYSTOSCOPY RIGHT URETEROSCOPY/HOLMIUM LASER/STENT PLACEMENT;  Surgeon: Brooks Donnice, MD;  Location: [facility];  Service: Urology;  Laterality: Right;  60 MINS   LITHOTRIPSY  10/06/2012   PUBOVAGINAL SLING N/A 04/15/2017   Procedure: CARLOYN GLADE;  Surgeon: Brooks Donnice, MD;  Location: [facility];  Service: Urology;  Laterality: N/A;  NEEDS 90 MIN FOR PROCEDURE   ROTATOR CUFF REPAIR Left    TYMPANOSTOMY TUBE PLACEMENT Right 2019   to clear up MRSA   There are no active problems to display for this patient.   REFERRING  PROVIDER: Beverley Evalene BIRCH, MD   REFERRING DIAG: presence of R artificial knee joint  L TKR   THERAPY DIAG:  Pain in both knees, unspecified chronicity  Stiffness of right knee, not elsewhere classified  Muscle weakness (generalized)  Difficulty in walking, not elsewhere classified  Rationale for Evaluation and Treatment: Rehabilitation  ONSET DATE: DOS  03/11/24  SUBJECTIVE:   SUBJECTIVE STATEMENT: Pt is 2 weeks and 6 days s/p R TKR.  Pt reports having tightness in R medial quad and pain in medial knee.  Pt states she felt fine after prior treatment.  Pt has been compliant with HEP.  Pt states she is able to perform her SLR's pretty much on her own.  Pt has been using ice.  Pt woke up hurting at 4:30 AM.  She took Tylenol  and used ice. Pt states she can sit with her knee dangling more.  It's easier to put on her shoe now.  Pt reports improved bed mobility.    PERTINENT HISTORY: R TKR 03/11/24 L TKR  12/04/2023 Arthritis in lumbar and R hip also HTN  PAIN:  R knee,  6/10 current  Aching pain   PRECAUTIONS: Other: bilat TKR    WEIGHT BEARING RESTRICTIONS: none indicated  FALLS:  Has patient fallen in last 6 months? Yes. Number of falls 4: R ankle rolled with new shoes ;  fell in the bathroom  when the towel bar broke off the wall   on wet leaves, walking up a ramp  LIVING ENVIRONMENT: Lives with: lives with their spouse Lives in: 1 story home Stairs: 3 steps to enter home with 1 rail Has following equipment at home: FWW, crutches, SPC, 3 pronged cane  OCCUPATION: part time home care, primarily sitting   PLOF: Independent  PATIENT GOALS: to improve pain and mobility   OBJECTIVE:  Note: Objective measures were completed at Evaluation unless otherwise noted.  DIAGNOSTIC FINDINGS: Pt is post op.   PATIENT SURVEYS:  LEFS:  10/80  COGNITION: Overall cognitive status: Within functional limits for tasks assessed     OBSERVATION: Pt has an aquacell bandage  over incision.  Aquacell has no drainage just a tiny spot on prox to mid drainage.  Pt has no band aids today and the 2 spots on R LE distal to bandage are healing well.  MD is aware of those spots.  Bruising has improved.    LOWER EXTREMITY ROM:   ROM Right eval Left eval Right 12/18  Hip flexion     Hip extension     Hip abduction     Hip adduction     Hip internal rotation     Hip external rotation     Knee flexion 81 PROM; 77 AROM  94 AROM  Knee extension 4 AROM  1 AROM  Ankle dorsiflexion     Ankle plantarflexion     Ankle inversion     Ankle eversion      (Blank rows = not tested)    LOWER EXTREMITY MMT:   Strength not tested due to recent surgery and post op considerations.   FUNCTIONAL TESTS:  Pt required assistance with lifting LE with bed mobility.   GAIT: Comments: pt ambulating with FWW with a heel to toe gait with limited toe off and increased UE Wb'ing through walker.                                                                                                                                TREATMENT:    Assessed knee ROM  Scifit recumbent bike rocking back and forth x 5 mins Supine heel slides with strap x 10 Supine heel slides x 10 supine SLR x 10 with min assist and 10 without assist LAQ 2x10 Supine hip abd 2x10 longsitting gastroc stretch 3x20 seconds Standing heel raises 2x10 1/4 squats 2x10  Pt received R knee flexion and extension PROM in supine per pt and tissue tolerance.    Updated HEP and gave pt a HEP handout.  PT educated pt in correct form and appropriate frequency.      PATIENT EDUCATION:  Education details:  Educated pt in post op protocol restrictions and expectations.  HEP, objective findings, dx, prognosis, exercise form, and POC.  PT answered pt's questions.  Person educated: Patient and husband Education method: Explanation, Demonstration, Tactile cues, Verbal cues,  and Handouts Education comprehension: verbalized  understanding, returned demonstration, verbal cues required, and tactile cues required  HOME EXERCISE PROGRAM: Exercises - Supine Quadricep Sets  - 2 x daily - 7 x weekly - 2 sets - 10 reps - 5 seconds hold - Supine Gluteal Sets  - 2 x daily - 7 x weekly - 2 sets - 10 reps - 5 seconds hold - Supine Ankle Pumps  - 7 x weekly - Longsitting Heel Slide  - 2-3 x daily - 7 x weekly - 1 sets - 10 reps -Sitting with knee flexed for 3-5 mins comfortably at various times for 6-7 times/day   Access Code: [email or web address] URL: [email or web address] Date: 03/23/2024 Prepared by: Mose Minerva  Exercises - Supine Short Arc Quad  - 1 x daily - 7 x weekly - 2 sets - 10 reps  Updated HEP: - Small Range Straight Leg Raise  - 2 x daily - 7 x weekly - 1 sets - 7-10 reps - Long Sitting Calf Stretch with Strap  - 2 x daily - 7 x weekly - 2-3 reps - 20 seconds hold - Supine Heel Slide with Strap  - 2 x daily - 7 x weekly - 2 sets - 10 reps  Updated HEP: - Heel Raises with Counter Support  - 1 x daily - 7 x weekly - 2 sets - 10 reps  ASSESSMENT:  CLINICAL IMPRESSION: Pt is 2 weeks and 6 days post op making good progress.  Pt reports improved bed mobility and donning shoe.  She is having medial knee pain though has improved pain overall.  Pt's incision is intact and healing without any signs of infections.  PT instructed pt in correct form with exercises and she performed exercises per protocol well.  PT progressed exercises per protocol and she had good tolerance with exercises.  Pt has minimal extensor lag with supine SLR.  PT provided assistance for 1 set of supine SLR and provided cuing to contract quad and decrease extensor lag.  Attempted S/L hip abduction though pt has difficulty and decreased range of motion.  PT had pt performed supine hip abd instead.  Pt tolerated knee PROM well and continues to make good progress with knee ROM.  PT updated HEP and gave pt a HEP handout.  Pt demonstrates good  understanding of HEP.  Pt responded well to treatment having no c/o's or increased pain after treatment.  Pt will benefit from cont skilled PT per protocol to address impairments and goals and to assist in restoring desired level of function.         OBJECTIVE IMPAIRMENTS: Abnormal gait, decreased activity tolerance, decreased endurance, decreased knowledge of use of DME, decreased mobility, difficulty walking, decreased ROM, decreased strength, hypomobility, increased edema, impaired flexibility, and pain.    ACTIVITY LIMITATIONS: bending, sitting, standing, squatting, stairs, transfers, bed mobility, dressing, and locomotion level   PARTICIPATION LIMITATIONS: meal prep, cleaning, laundry, driving, shopping, and community activity   PERSONAL FACTORS: 1-2 comorbidity: Arthritis and L TKR are also affecting patient's functional outcome.   REHAB POTENTIAL: Good  CLINICAL DECISION MAKING: Stable/uncomplicated  EVALUATION COMPLEXITY: Low   GOALS:   SHORT TERM GOALS:  Pt will be independen and compliant with HEP for improved pain, ROM, strength, and function. Baseline: Goal status: INITIAL Target date:  04/08/2024  2.  Pt will demo a good quad set and be able to perform a supine SLR independently for improved strength and stability with gait.  Baseline:  Goal status: INITIAL  Target date:  04/01/2021  3.  Pt will demo improved L knee  AROM to 0-95 deg for improved gait, stiffness, and mobility.  Baseline:  Goal status: INITIAL Target date:  04/15/2024  4.  Pt will demo improved quality of gait and ambulate with a SPC with good stability.  Baseline:  Goal status: INITIAL Target date:  04/15/2024  5.   Pt will be able to perform her normal transfers with no > than minimal difficulty.   Baseline:  Goal status: INITIAL Target date:  04/29/2024    LONG TERM GOALS: Target date: 05/27/2024   Pt will demo improved R knee AROM to 0 - 120 deg for improved mobility and stiffness and for  improved performance of stairs.  Baseline:  Goal status: INITIAL  2.  Pt will ambulate with a normalized heel to toe gait without limping.  Baseline:  Goal status: INITIAL  3.  Pt will ambulate community distance without an AD without significant knee pain and difficulty.  Baseline:  Goal status: INITIAL  4.  Pt will be able to perform household chores without significant knee pain and difficulty.  Baseline:  Goal status: INITIAL  5.   Pt will demo improved strength to 4+/5 in R hip abd, 5/5 in Hip flexion, and 5/5 in knee ext and flexion for improved performance of daily functional mobility and to assist with returning to recreational activities.  Baseline:  Goal status: INITIAL  6.  Pt will be able to perform 4-5 steps with a reciprocal gait with the rail for improved performance of stairs.  Baseline:  Goal status: INITIAL   PLAN:  PT FREQUENCY: 2-3x/wk x 3 weeks and 2x/wk afterwards   PT DURATION: 10 weeks   PLANNED INTERVENTIONS: 97164- PT Re-evaluation, 97750- Physical Performance Testing, 97110-Therapeutic exercises, 97530- Therapeutic activity, 97112- Neuromuscular re-education, 97535- Self Care, 02859- Manual therapy, [phone]- Gait training, [phone]- Aquatic Therapy, [phone]- Electrical stimulation (unattended), Patient/Family education, Balance training, Stair training, Taping, Joint mobilization, Spinal mobilization, Scar mobilization, DME instructions, Cryotherapy, and Moist heat  PLAN FOR NEXT SESSION: Cont per TKR protocol.  Cont with gait training and ice.     Leigh Minerva III PT, DPT 03/31/2024 1:00 PM      "

## 2024-04-02 ENCOUNTER — Ambulatory Visit (HOSPITAL_BASED_OUTPATIENT_CLINIC_OR_DEPARTMENT_OTHER): Payer: Self-pay | Admitting: Physical Therapy

## 2024-04-02 DIAGNOSIS — M25561 Pain in right knee: Secondary | ICD-10-CM

## 2024-04-02 DIAGNOSIS — M6281 Muscle weakness (generalized): Secondary | ICD-10-CM

## 2024-04-02 DIAGNOSIS — R262 Difficulty in walking, not elsewhere classified: Secondary | ICD-10-CM

## 2024-04-02 DIAGNOSIS — M25661 Stiffness of right knee, not elsewhere classified: Secondary | ICD-10-CM

## 2024-04-02 NOTE — Therapy (Signed)
 " OUTPATIENT PHYSICAL THERAPY LOWER EXTREMITY TREATMENT   Patient Name: Laura Mills MRN: [id number] DOB:[birth year], 63 y.o., female Today's Date: 04/02/2024  END OF SESSION:  PT End of Session - 04/02/24 1114     Visit Number 5    Number of Visits 22    Date for Recertification  05/27/24    Authorization Type AETNA    PT Start Time 1110    PT Stop Time 1202    PT Time Calculation (min) 52 min    Activity Tolerance Patient tolerated treatment well    Behavior During Therapy [facility] for tasks assessed/performed             Past Medical History:  Diagnosis Date   Allergy    Arthritis    LUMBAR SPINE AND RIGHT HIP   Cataract    GERD (gastroesophageal reflux disease)    History of kidney stones 10/2012   Hyperlipidemia    Hypertension    Renal stone    Past Surgical History:  Procedure Laterality Date   ABDOMINAL HYSTERECTOMY  2000   CHOLECYSTECTOMY  04/09/2007   CYSTOSCOPY WITH URETEROSCOPY Right 10/02/2012   Procedure: CYSTOSCOPY WITH RIGHT RETROGRADE AND STENT PLACEMENT ;  Surgeon: Donnice Gwenyth Brooks, MD;  Location: [facility];  Service: Urology;  Laterality: Right;   CYSTOSCOPY WITH URETEROSCOPY Right 11/13/2012   Procedure: RIGHT URETEROSCOPY;  Surgeon: Donnice Gwenyth Brooks, MD;  Location: [facility];  Service: Urology;  Laterality: Right;  with Stent   CYSTOSCOPY/URETEROSCOPY/HOLMIUM LASER/STENT PLACEMENT Right 05/31/2022   Procedure: CYSTOSCOPY RIGHT URETEROSCOPY/HOLMIUM LASER/STENT PLACEMENT;  Surgeon: Brooks Donnice, MD;  Location: [facility];  Service: Urology;  Laterality: Right;  60 MINS   LITHOTRIPSY  10/06/2012   PUBOVAGINAL SLING N/A 04/15/2017   Procedure: CARLOYN GLADE;  Surgeon: Brooks Donnice, MD;  Location: [facility];  Service: Urology;  Laterality: N/A;  NEEDS 90 MIN FOR PROCEDURE   ROTATOR CUFF REPAIR Left    TYMPANOSTOMY TUBE PLACEMENT Right 2019   to clear up MRSA   There are no active problems to display for this patient.   REFERRING  PROVIDER: Beverley Evalene BIRCH, MD   REFERRING DIAG: presence of R artificial knee joint  L TKR   THERAPY DIAG:  Pain in both knees, unspecified chronicity  Stiffness of right knee, not elsewhere classified  Muscle weakness (generalized)  Difficulty in walking, not elsewhere classified  Rationale for Evaluation and Treatment: Rehabilitation  ONSET DATE: DOS  03/11/24  SUBJECTIVE:   SUBJECTIVE STATEMENT: Pt is 3 weeks and 1 day s/p R TKR.  Pt was with the family yesterday for Christmas and hurt a lot last night.  Her pain is worse at night.  Pt doesn't walk much without her walker.  She feels that her knee can lock up.  She limps a lot when walking without walker.  Pt has been using the bone foam pad at home for extension.  Pt has been using ice.        PERTINENT HISTORY: R TKR 03/11/24 L TKR  12/04/2023 Arthritis in lumbar and R hip also HTN  PAIN:  R knee,  5/10 current  Aching pain in R knee. Soreness in distal quad   PRECAUTIONS: Other: bilat TKR    WEIGHT BEARING RESTRICTIONS: none indicated  FALLS:  Has patient fallen in last 6 months? Yes. Number of falls 4: R ankle rolled with new shoes ;  fell in the bathroom when the towel bar broke off the wall   on wet leaves,  walking up a ramp  LIVING ENVIRONMENT: Lives with: lives with their spouse Lives in: 1 story home Stairs: 3 steps to enter home with 1 rail Has following equipment at home: FWW, crutches, SPC, 3 pronged cane  OCCUPATION: part time home care, primarily sitting   PLOF: Independent  PATIENT GOALS: to improve pain and mobility   OBJECTIVE:  Note: Objective measures were completed at Evaluation unless otherwise noted.  DIAGNOSTIC FINDINGS: Pt is post op.   PATIENT SURVEYS:  LEFS:  10/80  COGNITION: Overall cognitive status: Within functional limits for tasks assessed     OBSERVATION: Pt has an aquacell bandage over incision.  Aquacell has no drainage just a tiny spot on prox to mid  drainage.  Pt has no band aids today and the 2 spots on R LE distal to bandage are healing well.  MD is aware of those spots.  Bruising has improved.    LOWER EXTREMITY ROM:   ROM Right eval Left eval Right 12/18 Right 12/26  Hip flexion      Hip extension      Hip abduction      Hip adduction      Hip internal rotation      Hip external rotation      Knee flexion 81 PROM; 77 AROM  94 AROM 107 AROM  Knee extension 4 AROM  1 AROM 1 AROM ; 0 deg after stretching  Ankle dorsiflexion      Ankle plantarflexion      Ankle inversion      Ankle eversion       (Blank rows = not tested)    LOWER EXTREMITY MMT:   Strength not tested due to recent surgery and post op considerations.   FUNCTIONAL TESTS:  Pt required assistance with lifting LE with bed mobility.   GAIT: Comments: pt ambulating with FWW with a heel to toe gait with limited toe off and increased UE Wb'ing through walker.                                                                                                                                TREATMENT:    Assessed knee ROM Pt received gentle R knee manual extension stretch in supine Pt received R knee flexion and extension PROM in supine per pt and tissue tolerance.    Scifit recumbent bike rocking back and forth x 6 mins Standing heel raises 2x10 Supine heel slides with strap x 10 supine SLR 2x10 with min assist and 10 without assist LAQ 2x10 SAQ  1#  2x10 TKE RTB 2x10  Gait:  Pt ambulated in hallway with FWW with cues for heel to toe gait pattern.  Pt's walker was too low and PT increased walker height.      PATIENT EDUCATION:  Education details:  Educated pt in post op protocol restrictions and expectations.  HEP, gait, walker height, ROM findings, dx, prognosis, exercise form, and POC.  PT answered pt's questions.  Person educated: Patient and husband Education method: Explanation, Demonstration, Tactile cues, Verbal cues, and Handouts Education  comprehension: verbalized understanding, returned demonstration, verbal cues required, and tactile cues required  HOME EXERCISE PROGRAM: Exercises - Supine Quadricep Sets  - 2 x daily - 7 x weekly - 2 sets - 10 reps - 5 seconds hold - Supine Gluteal Sets  - 2 x daily - 7 x weekly - 2 sets - 10 reps - 5 seconds hold - Supine Ankle Pumps  - 7 x weekly - Longsitting Heel Slide  - 2-3 x daily - 7 x weekly - 1 sets - 10 reps -Sitting with knee flexed for 3-5 mins comfortably at various times for 6-7 times/day   Access Code: [email or web address] URL: [email or web address] Date: 03/23/2024 Prepared by: Mose Minerva  Exercises - Supine Short Arc Quad  - 1 x daily - 7 x weekly - 2 sets - 10 reps - Small Range Straight Leg Raise  - 2 x daily - 7 x weekly - 1 sets - 7-10 reps - Long Sitting Calf Stretch with Strap  - 2 x daily - 7 x weekly - 2-3 reps - 20 seconds hold - Supine Heel Slide with Strap  - 2 x daily - 7 x weekly - 2 sets - 10 reps - Heel Raises with Counter Support  - 1 x daily - 7 x weekly - 2 sets - 10 reps  ASSESSMENT:  CLINICAL IMPRESSION: Pt is making good progress in all areas.  She continues to make good progress with knee ROM.  Pt demonstrates improved knee flexion ROM.  She lacked 1 deg of extension AROM which improved to 0 deg after manual extension stretch.  PT provided cuing and minimal assistance to improve extensor lag with supine SLR and she has improved performance with cuing and assistance.  Pt performed exercises per protocol well with cuing and instruction for correct form.  Pt ambulated well with FWW.  PT did increase height of walker and educated pt in correct height of walker.  Pt tolerated treatment well and will benefit from cont skilled PT per protocol to address impairments and goals and to assist in restoring desired level of function.      OBJECTIVE IMPAIRMENTS: Abnormal gait, decreased activity tolerance, decreased endurance, decreased knowledge of use of  DME, decreased mobility, difficulty walking, decreased ROM, decreased strength, hypomobility, increased edema, impaired flexibility, and pain.    ACTIVITY LIMITATIONS: bending, sitting, standing, squatting, stairs, transfers, bed mobility, dressing, and locomotion level   PARTICIPATION LIMITATIONS: meal prep, cleaning, laundry, driving, shopping, and community activity   PERSONAL FACTORS: 1-2 comorbidity: Arthritis and L TKR are also affecting patient's functional outcome.   REHAB POTENTIAL: Good  CLINICAL DECISION MAKING: Stable/uncomplicated  EVALUATION COMPLEXITY: Low   GOALS:   SHORT TERM GOALS:  Pt will be independen and compliant with HEP for improved pain, ROM, strength, and function. Baseline: Goal status: INITIAL Target date:  04/08/2024  2.  Pt will demo a good quad set and be able to perform a supine SLR independently for improved strength and stability with gait.  Baseline:  Goal status: INITIAL Target date:  04/01/2021  3.  Pt will demo improved L knee  AROM to 0-95 deg for improved gait, stiffness, and mobility.  Baseline:  Goal status: INITIAL Target date:  04/15/2024  4.  Pt will demo improved quality of gait and ambulate with a SPC with good stability.  Baseline:  Goal status: INITIAL Target  date:  04/15/2024  5.   Pt will be able to perform her normal transfers with no > than minimal difficulty.   Baseline:  Goal status: INITIAL Target date:  04/29/2024    LONG TERM GOALS: Target date: 05/27/2024   Pt will demo improved R knee AROM to 0 - 120 deg for improved mobility and stiffness and for improved performance of stairs.  Baseline:  Goal status: INITIAL  2.  Pt will ambulate with a normalized heel to toe gait without limping.  Baseline:  Goal status: INITIAL  3.  Pt will ambulate community distance without an AD without significant knee pain and difficulty.  Baseline:  Goal status: INITIAL  4.  Pt will be able to perform household chores without  significant knee pain and difficulty.  Baseline:  Goal status: INITIAL  5.   Pt will demo improved strength to 4+/5 in R hip abd, 5/5 in Hip flexion, and 5/5 in knee ext and flexion for improved performance of daily functional mobility and to assist with returning to recreational activities.  Baseline:  Goal status: INITIAL  6.  Pt will be able to perform 4-5 steps with a reciprocal gait with the rail for improved performance of stairs.  Baseline:  Goal status: INITIAL   PLAN:  PT FREQUENCY: 2-3x/wk x 3 weeks and 2x/wk afterwards   PT DURATION: 10 weeks   PLANNED INTERVENTIONS: 97164- PT Re-evaluation, 97750- Physical Performance Testing, 97110-Therapeutic exercises, 97530- Therapeutic activity, 97112- Neuromuscular re-education, 97535- Self Care, 02859- Manual therapy, [phone]- Gait training, [phone]- Aquatic Therapy, [phone]- Electrical stimulation (unattended), Patient/Family education, Balance training, Stair training, Taping, Joint mobilization, Spinal mobilization, Scar mobilization, DME instructions, Cryotherapy, and Moist heat  PLAN FOR NEXT SESSION: Cont per TKR protocol.  Cont with gait training and ice.     Leigh Minerva III PT, DPT 04/02/2024 12:29 PM       "

## 2024-04-04 NOTE — Therapy (Signed)
 " OUTPATIENT PHYSICAL THERAPY LOWER EXTREMITY TREATMENT   Patient Name: Laura Mills MRN: [id number] DOB:[birth year], 63 y.o., female Today's Date: 04/06/2024  END OF SESSION:  PT End of Session - 04/05/24 1444     Visit Number 6    Number of Visits 22    Date for Recertification  05/27/24    Authorization Type AETNA    PT Start Time 1407    PT Stop Time 1449    PT Time Calculation (min) 42 min    Activity Tolerance Patient tolerated treatment well    Behavior During Therapy [facility] for tasks assessed/performed              Past Medical History:  Diagnosis Date   Allergy    Arthritis    LUMBAR SPINE AND RIGHT HIP   Cataract    GERD (gastroesophageal reflux disease)    History of kidney stones 10/2012   Hyperlipidemia    Hypertension    Renal stone    Past Surgical History:  Procedure Laterality Date   ABDOMINAL HYSTERECTOMY  2000   CHOLECYSTECTOMY  04/09/2007   CYSTOSCOPY WITH URETEROSCOPY Right 10/02/2012   Procedure: CYSTOSCOPY WITH RIGHT RETROGRADE AND STENT PLACEMENT ;  Surgeon: Donnice Gwenyth Brooks, MD;  Location: [facility];  Service: Urology;  Laterality: Right;   CYSTOSCOPY WITH URETEROSCOPY Right 11/13/2012   Procedure: RIGHT URETEROSCOPY;  Surgeon: Donnice Gwenyth Brooks, MD;  Location: [facility];  Service: Urology;  Laterality: Right;  with Stent   CYSTOSCOPY/URETEROSCOPY/HOLMIUM LASER/STENT PLACEMENT Right 05/31/2022   Procedure: CYSTOSCOPY RIGHT URETEROSCOPY/HOLMIUM LASER/STENT PLACEMENT;  Surgeon: Brooks Donnice, MD;  Location: [facility];  Service: Urology;  Laterality: Right;  60 MINS   LITHOTRIPSY  10/06/2012   PUBOVAGINAL SLING N/A 04/15/2017   Procedure: CARLOYN GLADE;  Surgeon: Brooks Donnice, MD;  Location: [facility];  Service: Urology;  Laterality: N/A;  NEEDS 90 MIN FOR PROCEDURE   ROTATOR CUFF REPAIR Left    TYMPANOSTOMY TUBE PLACEMENT Right 2019   to clear up MRSA   There are no active problems to display for this patient.   REFERRING  PROVIDER: Beverley Evalene BIRCH, MD   REFERRING DIAG: presence of R artificial knee joint  L TKR   THERAPY DIAG:  Pain in both knees, unspecified chronicity  Stiffness of right knee, not elsewhere classified  Muscle weakness (generalized)  Difficulty in walking, not elsewhere classified  Rationale for Evaluation and Treatment: Rehabilitation  ONSET DATE: DOS  03/11/24  SUBJECTIVE:   SUBJECTIVE STATEMENT: Pt is 3 weeks and 4 days s/p R TKR.  Pt has disturbed sleep due to her dog and pain.  It takes awhile to find a comfortable position to go back to sleep.  Pt states she has had 3 consecutive days when she hasn't felt the constant pain.  Pt has soreness with HEP and is compliant with HEP.  Pt has been using the bone foam pad at home for extension.  Pt states they picked up the CPM today.  She has been using ice.  Pt had no adverse effects after prior treatment stating she felt fine.  Pt had one occasion of feeling her knee wanting to buckle yesterday when she was walking with the walker.     PERTINENT HISTORY: R TKR 03/11/24 L TKR  12/04/2023 Arthritis in lumbar and R hip also HTN  PAIN:  R knee,  3-4/10 current     PRECAUTIONS: Other: bilat TKR    WEIGHT BEARING RESTRICTIONS: none indicated  FALLS:  Has  patient fallen in last 6 months? Yes. Number of falls 4: R ankle rolled with new shoes ;  fell in the bathroom when the towel bar broke off the wall   on wet leaves, walking up a ramp  LIVING ENVIRONMENT: Lives with: lives with their spouse Lives in: 1 story home Stairs: 3 steps to enter home with 1 rail Has following equipment at home: FWW, crutches, SPC, 3 pronged cane  OCCUPATION: part time home care, primarily sitting   PLOF: Independent  PATIENT GOALS: to improve pain and mobility   OBJECTIVE:  Note: Objective measures were completed at Evaluation unless otherwise noted.  DIAGNOSTIC FINDINGS: Pt is post op.   PATIENT SURVEYS:  LEFS:   10/80  COGNITION: Overall cognitive status: Within functional limits for tasks assessed     OBSERVATION: Pt has an aquacell bandage over incision.  Aquacell has no drainage just a tiny spot on prox to mid drainage.  Pt has no band aids today and the 2 spots on R LE distal to bandage are healing well.  MD is aware of those spots.  Bruising has improved.    LOWER EXTREMITY ROM:   ROM Right eval Left eval Right 12/18 Right 12/26 Right 12/29  Hip flexion       Hip extension       Hip abduction       Hip adduction       Hip internal rotation       Hip external rotation       Knee flexion 81 PROM; 77 AROM  94 AROM 107 AROM   Knee extension 4 AROM  1 AROM 1 AROM ; 0 deg after stretching 1 AROM ; 0 deg after stretching  Ankle dorsiflexion       Ankle plantarflexion       Ankle inversion       Ankle eversion        (Blank rows = not tested)    LOWER EXTREMITY MMT:   Strength not tested due to recent surgery and post op considerations.   FUNCTIONAL TESTS:  Pt required assistance with lifting LE with bed mobility.   GAIT: Comments: pt ambulating with FWW with a heel to toe gait with limited toe off and increased UE Wb'ing through walker.                                                                                                                                TREATMENT:    Assessed knee ROM Pt received gentle R knee manual extension stretch in supine Pt received R knee flexion and extension PROM in supine per pt and tissue tolerance.    Scifit recumbent bike rocking back and forth x 5 mins supine SLR 2x10 with min assist  LAQ 2x10 Supine heel slides with strap x 10 reps Supine heel slides x10 TKE RTB/YTB x10/2x10 respectively 1/4 squats 2x10 with UE support on rail F/B weight shifts  in staggered stance with UE support on rail x 10 Step ups 2 inch step 2x10 with UE support on rail     PATIENT EDUCATION:  Education details:  Educated pt in post op protocol  restrictions and expectations.  HEP, gait, walker height, ROM findings, dx, prognosis, exercise form, and POC.  PT answered pt's questions.  Person educated: Patient and husband Education method: Explanation, Demonstration, Tactile cues, Verbal cues, and Handouts Education comprehension: verbalized understanding, returned demonstration, verbal cues required, and tactile cues required  HOME EXERCISE PROGRAM: Exercises - Supine Quadricep Sets  - 2 x daily - 7 x weekly - 2 sets - 10 reps - 5 seconds hold - Supine Gluteal Sets  - 2 x daily - 7 x weekly - 2 sets - 10 reps - 5 seconds hold - Supine Ankle Pumps  - 7 x weekly - Longsitting Heel Slide  - 2-3 x daily - 7 x weekly - 1 sets - 10 reps -Sitting with knee flexed for 3-5 mins comfortably at various times for 6-7 times/day   Access Code: [email or web address] URL: [email or web address] Date: 03/23/2024 Prepared by: Mose Minerva  Exercises - Supine Short Arc Quad  - 1 x daily - 7 x weekly - 2 sets - 10 reps - Small Range Straight Leg Raise  - 2 x daily - 7 x weekly - 1 sets - 7-10 reps - Long Sitting Calf Stretch with Strap  - 2 x daily - 7 x weekly - 2-3 reps - 20 seconds hold - Supine Heel Slide with Strap  - 2 x daily - 7 x weekly - 2 sets - 10 reps - Heel Raises with Counter Support  - 1 x daily - 7 x weekly - 2 sets - 10 reps  Updated HEP: - Standing Terminal Knee Extension with Resistance  - 1 x daily - 5-6 x weekly - 2 sets - 10 reps  ASSESSMENT:  CLINICAL IMPRESSION: Pt reports improved pain over the past few days.  She lacked 1 deg of extension AROM which improved to 0 deg after manual extension stretch.  Pt tolerated knee PROM well and continues to make good progress with knee flexion ROM.  PT instructed pt in correct form with exercises and she performed exercises per protocol well.  PT updated HEP and gave pt a HEP handout.  Pt performed TKE with YTB and RTB and had improved form and control with YTB.  Pt continues to have  extensor lag with supine SLR which improved with minimal assistance.  Pt tolerated treatment well reporting increased pain from 3-4/10 at beginning of treatment to 5/10 after treatment.  She will benefit from cont skilled PT per protocol to address impairments and goals and to assist in restoring desired level of function.    OBJECTIVE IMPAIRMENTS: Abnormal gait, decreased activity tolerance, decreased endurance, decreased knowledge of use of DME, decreased mobility, difficulty walking, decreased ROM, decreased strength, hypomobility, increased edema, impaired flexibility, and pain.    ACTIVITY LIMITATIONS: bending, sitting, standing, squatting, stairs, transfers, bed mobility, dressing, and locomotion level   PARTICIPATION LIMITATIONS: meal prep, cleaning, laundry, driving, shopping, and community activity   PERSONAL FACTORS: 1-2 comorbidity: Arthritis and L TKR are also affecting patient's functional outcome.   REHAB POTENTIAL: Good  CLINICAL DECISION MAKING: Stable/uncomplicated  EVALUATION COMPLEXITY: Low   GOALS:   SHORT TERM GOALS:  Pt will be independen and compliant with HEP for improved pain, ROM, strength, and function. Baseline: Goal status: INITIAL Target date:  04/08/2024  2.  Pt will demo a good quad set and be able to perform a supine SLR independently for improved strength and stability with gait.  Baseline:  Goal status: INITIAL Target date:  04/01/2021  3.  Pt will demo improved L knee  AROM to 0-95 deg for improved gait, stiffness, and mobility.  Baseline:  Goal status: INITIAL Target date:  04/15/2024  4.  Pt will demo improved quality of gait and ambulate with a SPC with good stability.  Baseline:  Goal status: INITIAL Target date:  04/15/2024  5.   Pt will be able to perform her normal transfers with no > than minimal difficulty.   Baseline:  Goal status: INITIAL Target date:  04/29/2024    LONG TERM GOALS: Target date: 05/27/2024   Pt will demo  improved R knee AROM to 0 - 120 deg for improved mobility and stiffness and for improved performance of stairs.  Baseline:  Goal status: INITIAL  2.  Pt will ambulate with a normalized heel to toe gait without limping.  Baseline:  Goal status: INITIAL  3.  Pt will ambulate community distance without an AD without significant knee pain and difficulty.  Baseline:  Goal status: INITIAL  4.  Pt will be able to perform household chores without significant knee pain and difficulty.  Baseline:  Goal status: INITIAL  5.   Pt will demo improved strength to 4+/5 in R hip abd, 5/5 in Hip flexion, and 5/5 in knee ext and flexion for improved performance of daily functional mobility and to assist with returning to recreational activities.  Baseline:  Goal status: INITIAL  6.  Pt will be able to perform 4-5 steps with a reciprocal gait with the rail for improved performance of stairs.  Baseline:  Goal status: INITIAL   PLAN:  PT FREQUENCY: 2-3x/wk x 3 weeks and 2x/wk afterwards   PT DURATION: 10 weeks   PLANNED INTERVENTIONS: 97164- PT Re-evaluation, 97750- Physical Performance Testing, 97110-Therapeutic exercises, 97530- Therapeutic activity, 97112- Neuromuscular re-education, 97535- Self Care, 02859- Manual therapy, [phone]- Gait training, [phone]- Aquatic Therapy, [phone]- Electrical stimulation (unattended), Patient/Family education, Balance training, Stair training, Taping, Joint mobilization, Spinal mobilization, Scar mobilization, DME instructions, Cryotherapy, and Moist heat  PLAN FOR NEXT SESSION: Cont per TKR protocol.  Cont with gait training and ice.     Leigh Minerva III PT, DPT 04/06/2024 11:01 PM        "

## 2024-04-05 ENCOUNTER — Ambulatory Visit (HOSPITAL_BASED_OUTPATIENT_CLINIC_OR_DEPARTMENT_OTHER): Payer: Self-pay | Admitting: Physical Therapy

## 2024-04-05 ENCOUNTER — Encounter (HOSPITAL_BASED_OUTPATIENT_CLINIC_OR_DEPARTMENT_OTHER): Payer: Self-pay | Admitting: Physical Therapy

## 2024-04-05 DIAGNOSIS — M25661 Stiffness of right knee, not elsewhere classified: Secondary | ICD-10-CM

## 2024-04-05 DIAGNOSIS — R262 Difficulty in walking, not elsewhere classified: Secondary | ICD-10-CM

## 2024-04-05 DIAGNOSIS — M25561 Pain in right knee: Secondary | ICD-10-CM

## 2024-04-05 DIAGNOSIS — M6281 Muscle weakness (generalized): Secondary | ICD-10-CM

## 2024-04-07 ENCOUNTER — Encounter (HOSPITAL_BASED_OUTPATIENT_CLINIC_OR_DEPARTMENT_OTHER): Payer: Self-pay | Admitting: Physical Therapy

## 2024-04-07 ENCOUNTER — Ambulatory Visit (HOSPITAL_BASED_OUTPATIENT_CLINIC_OR_DEPARTMENT_OTHER): Admitting: Physical Therapy

## 2024-04-07 DIAGNOSIS — M25561 Pain in right knee: Secondary | ICD-10-CM | POA: Diagnosis not present

## 2024-04-07 DIAGNOSIS — M25661 Stiffness of right knee, not elsewhere classified: Secondary | ICD-10-CM

## 2024-04-07 DIAGNOSIS — R262 Difficulty in walking, not elsewhere classified: Secondary | ICD-10-CM

## 2024-04-07 DIAGNOSIS — M6281 Muscle weakness (generalized): Secondary | ICD-10-CM

## 2024-04-07 NOTE — Therapy (Signed)
 " OUTPATIENT PHYSICAL THERAPY LOWER EXTREMITY TREATMENT   Patient Name: Laura Mills MRN: [id number] DOB:08/17/1960, 63 y.o., female Today's Date: 04/07/2024  END OF SESSION:  PT End of Session - 04/07/24 1205     Visit Number 7    Number of Visits 22    Date for Recertification  05/27/24    Authorization Type AETNA    PT Start Time 1155    PT Stop Time 1236    PT Time Calculation (min) 41 min    Activity Tolerance Patient tolerated treatment well    Behavior During Therapy [facility] for tasks assessed/performed              Past Medical History:  Diagnosis Date   Allergy    Arthritis    LUMBAR SPINE AND RIGHT HIP   Cataract    GERD (gastroesophageal reflux disease)    History of kidney stones 10/2012   Hyperlipidemia    Hypertension    Renal stone    Past Surgical History:  Procedure Laterality Date   ABDOMINAL HYSTERECTOMY  2000   CHOLECYSTECTOMY  04/09/2007   CYSTOSCOPY WITH URETEROSCOPY Right 10/02/2012   Procedure: CYSTOSCOPY WITH RIGHT RETROGRADE AND STENT PLACEMENT ;  Surgeon: Donnice Gwenyth Brooks, MD;  Location: [facility];  Service: Urology;  Laterality: Right;   CYSTOSCOPY WITH URETEROSCOPY Right 11/13/2012   Procedure: RIGHT URETEROSCOPY;  Surgeon: Donnice Gwenyth Brooks, MD;  Location: [facility];  Service: Urology;  Laterality: Right;  with Stent   CYSTOSCOPY/URETEROSCOPY/HOLMIUM LASER/STENT PLACEMENT Right 05/31/2022   Procedure: CYSTOSCOPY RIGHT URETEROSCOPY/HOLMIUM LASER/STENT PLACEMENT;  Surgeon: Brooks Donnice, MD;  Location: [facility];  Service: Urology;  Laterality: Right;  60 MINS   LITHOTRIPSY  10/06/2012   PUBOVAGINAL SLING N/A 04/15/2017   Procedure: CARLOYN GLADE;  Surgeon: Brooks Donnice, MD;  Location: [facility];  Service: Urology;  Laterality: N/A;  NEEDS 90 MIN FOR PROCEDURE   ROTATOR CUFF REPAIR Left    TYMPANOSTOMY TUBE PLACEMENT Right 2019   to clear up MRSA   There are no active problems to display for this patient.   REFERRING  PROVIDER: Beverley Evalene BIRCH, MD   REFERRING DIAG: presence of R artificial knee joint  L TKR   THERAPY DIAG:  Pain in both knees, unspecified chronicity  Stiffness of right knee, not elsewhere classified  Muscle weakness (generalized)  Difficulty in walking, not elsewhere classified  Rationale for Evaluation and Treatment: Rehabilitation  ONSET DATE: DOS  03/11/24  SUBJECTIVE:   SUBJECTIVE STATEMENT: Pt is 3 weeks and 6 days s/p R TKR.  I'm doing good today.  Pt slept well last night.  Pt states she had a hard day after prior treatment.  She had a sore ache though not really painful.    PERTINENT HISTORY: R TKR 03/11/24 L TKR  12/04/2023 Arthritis in lumbar and R hip also HTN  PAIN:  R knee,  1-2/10 current     PRECAUTIONS: Other: bilat TKR    WEIGHT BEARING RESTRICTIONS: none indicated  FALLS:  Has patient fallen in last 6 months? Yes. Number of falls 4: R ankle rolled with new shoes ;  fell in the bathroom when the towel bar broke off the wall   on wet leaves, walking up a ramp  LIVING ENVIRONMENT: Lives with: lives with their spouse Lives in: 1 story home Stairs: 3 steps to enter home with 1 rail Has following equipment at home: FWW, crutches, SPC, 3 pronged cane  OCCUPATION: part time home care, primarily sitting  PLOF: Independent  PATIENT GOALS: to improve pain and mobility   OBJECTIVE:  Note: Objective measures were completed at Evaluation unless otherwise noted.  DIAGNOSTIC FINDINGS: Pt is post op.   PATIENT SURVEYS:  LEFS:  10/80  COGNITION: Overall cognitive status: Within functional limits for tasks assessed     OBSERVATION: Pt has an aquacell bandage over incision.  Aquacell has no drainage just a tiny spot on prox to mid drainage.  Pt has no band aids today and the 2 spots on R LE distal to bandage are healing well.  MD is aware of those spots.  Bruising has improved.    LOWER EXTREMITY ROM:   ROM Right eval Left eval  Right 12/18 Right 12/26 Right 12/29  Hip flexion       Hip extension       Hip abduction       Hip adduction       Hip internal rotation       Hip external rotation       Knee flexion 81 PROM; 77 AROM  94 AROM 107 AROM   Knee extension 4 AROM  1 AROM 1 AROM ; 0 deg after stretching 1 AROM ; 0 deg after stretching  Ankle dorsiflexion       Ankle plantarflexion       Ankle inversion       Ankle eversion        (Blank rows = not tested)    LOWER EXTREMITY MMT:   Strength not tested due to recent surgery and post op considerations.   FUNCTIONAL TESTS:  Pt required assistance with lifting LE with bed mobility.   GAIT: Comments: pt ambulating with FWW with a heel to toe gait with limited toe off and increased UE Wb'ing through walker.                                                                                                                                TREATMENT:    Pt received R knee flexion and extension PROM in supine per pt and tissue tolerance.    Scifit recumbent bike rocking back and forth x 5 mins supine SLR 2x10 without assistance  LAQ 2x10 SAQ 1.5#  2x10 Pt received R knee flexion and extension PROM per pt and tissue tolerance.  Pt received gentle R knee manual extension stretch in supine Supine heel slides approx 12-15 1/4 squats 2x10 with UE support on rail F/B weight shifts in staggered stance with UE support on rail  Step ups 2 inch step 2x10 with UE support on rail     PATIENT EDUCATION:  Education details:  Educated pt in post op and protocol expectations.  HEP, dx, prognosis, exercise form, and POC.  PT answered pt's questions.  Person educated: Patient Education method: Explanation, Demonstration, Tactile cues, Verbal cues, and Handouts Education comprehension: verbalized understanding, returned demonstration, verbal cues required, and tactile cues required  HOME EXERCISE  PROGRAM: Exercises - Supine Quadricep Sets  - 2 x daily - 7 x weekly -  2 sets - 10 reps - 5 seconds hold - Supine Gluteal Sets  - 2 x daily - 7 x weekly - 2 sets - 10 reps - 5 seconds hold - Supine Ankle Pumps  - 7 x weekly - Longsitting Heel Slide  - 2-3 x daily - 7 x weekly - 1 sets - 10 reps -Sitting with knee flexed for 3-5 mins comfortably at various times for 6-7 times/day   Access Code: [email or web address] URL: [email or web address] Date: 03/23/2024 Prepared by: Mose Minerva  Exercises - Supine Short Arc Quad  - 1 x daily - 7 x weekly - 2 sets - 10 reps - Small Range Straight Leg Raise  - 2 x daily - 7 x weekly - 1 sets - 7-10 reps - Long Sitting Calf Stretch with Strap  - 2 x daily - 7 x weekly - 2-3 reps - 20 seconds hold - Supine Heel Slide with Strap  - 2 x daily - 7 x weekly - 2 sets - 10 reps - Heel Raises with Counter Support  - 1 x daily - 7 x weekly - 2 sets - 10 reps  Updated HEP: - Standing Terminal Knee Extension with Resistance  - 1 x daily - 5-6 x weekly - 2 sets - 10 reps  ASSESSMENT:  CLINICAL IMPRESSION: Pt is progressing well in all areas.  Pt has improved pain and presents to treatment with reduced pain today.  Pt tolerates knee PROM well.  She performed exercises per protocol well and is progressing appropriately with protocol.  Pt is improving with supine SLR though is fatigued with exercise.  Pt tolerated treatment well having no c/o's after treatment.  She will benefit from cont skilled PT per protocol to improve pain, ROM, strength, gait, and functional mobility.       OBJECTIVE IMPAIRMENTS: Abnormal gait, decreased activity tolerance, decreased endurance, decreased knowledge of use of DME, decreased mobility, difficulty walking, decreased ROM, decreased strength, hypomobility, increased edema, impaired flexibility, and pain.    ACTIVITY LIMITATIONS: bending, sitting, standing, squatting, stairs, transfers, bed mobility, dressing, and locomotion level   PARTICIPATION LIMITATIONS: meal prep, cleaning, laundry, driving,  shopping, and community activity   PERSONAL FACTORS: 1-2 comorbidity: Arthritis and L TKR are also affecting patient's functional outcome.   REHAB POTENTIAL: Good  CLINICAL DECISION MAKING: Stable/uncomplicated  EVALUATION COMPLEXITY: Low   GOALS:   SHORT TERM GOALS:  Pt will be independen and compliant with HEP for improved pain, ROM, strength, and function. Baseline: Goal status: INITIAL Target date:  04/08/2024  2.  Pt will demo a good quad set and be able to perform a supine SLR independently for improved strength and stability with gait.  Baseline:  Goal status: INITIAL Target date:  04/01/2021  3.  Pt will demo improved L knee  AROM to 0-95 deg for improved gait, stiffness, and mobility.  Baseline:  Goal status: INITIAL Target date:  04/15/2024  4.  Pt will demo improved quality of gait and ambulate with a SPC with good stability.  Baseline:  Goal status: INITIAL Target date:  04/15/2024  5.   Pt will be able to perform her normal transfers with no > than minimal difficulty.   Baseline:  Goal status: INITIAL Target date:  04/29/2024    LONG TERM GOALS: Target date: 05/27/2024   Pt will demo improved R knee AROM to 0 - 120 deg for improved  mobility and stiffness and for improved performance of stairs.  Baseline:  Goal status: INITIAL  2.  Pt will ambulate with a normalized heel to toe gait without limping.  Baseline:  Goal status: INITIAL  3.  Pt will ambulate community distance without an AD without significant knee pain and difficulty.  Baseline:  Goal status: INITIAL  4.  Pt will be able to perform household chores without significant knee pain and difficulty.  Baseline:  Goal status: INITIAL  5.   Pt will demo improved strength to 4+/5 in R hip abd, 5/5 in Hip flexion, and 5/5 in knee ext and flexion for improved performance of daily functional mobility and to assist with returning to recreational activities.  Baseline:  Goal status: INITIAL  6.  Pt  will be able to perform 4-5 steps with a reciprocal gait with the rail for improved performance of stairs.  Baseline:  Goal status: INITIAL   PLAN:  PT FREQUENCY: 2-3x/wk x 3 weeks and 2x/wk afterwards   PT DURATION: 10 weeks   PLANNED INTERVENTIONS: 97164- PT Re-evaluation, 97750- Physical Performance Testing, 97110-Therapeutic exercises, 97530- Therapeutic activity, 97112- Neuromuscular re-education, 97535- Self Care, 02859- Manual therapy, [phone]- Gait training, [phone]- Aquatic Therapy, [phone]- Electrical stimulation (unattended), Patient/Family education, Balance training, Stair training, Taping, Joint mobilization, Spinal mobilization, Scar mobilization, DME instructions, Cryotherapy, and Moist heat  PLAN FOR NEXT SESSION: Cont per TKR protocol.  Cont with gait training and ice.     Leigh Minerva III PT, DPT 04/07/2024 10:18 PM         "

## 2024-04-09 ENCOUNTER — Encounter (HOSPITAL_BASED_OUTPATIENT_CLINIC_OR_DEPARTMENT_OTHER): Payer: Self-pay | Admitting: Physical Therapy

## 2024-04-09 ENCOUNTER — Ambulatory Visit (HOSPITAL_BASED_OUTPATIENT_CLINIC_OR_DEPARTMENT_OTHER): Payer: Self-pay | Attending: Orthopedic Surgery | Admitting: Physical Therapy

## 2024-04-09 DIAGNOSIS — M25562 Pain in left knee: Secondary | ICD-10-CM | POA: Diagnosis present

## 2024-04-09 DIAGNOSIS — M25661 Stiffness of right knee, not elsewhere classified: Secondary | ICD-10-CM | POA: Diagnosis present

## 2024-04-09 DIAGNOSIS — R262 Difficulty in walking, not elsewhere classified: Secondary | ICD-10-CM | POA: Insufficient documentation

## 2024-04-09 DIAGNOSIS — M25561 Pain in right knee: Secondary | ICD-10-CM | POA: Insufficient documentation

## 2024-04-09 DIAGNOSIS — M6281 Muscle weakness (generalized): Secondary | ICD-10-CM | POA: Insufficient documentation

## 2024-04-09 NOTE — Therapy (Signed)
 " OUTPATIENT PHYSICAL THERAPY LOWER EXTREMITY TREATMENT   Patient Name: Laura Mills MRN: [id number] DOB:08/12/1960, 64 y.o., female Today's Date: 04/09/2024  END OF SESSION:  PT End of Session - 04/09/24 0810     Visit Number 8    Number of Visits 22    Date for Recertification  05/27/24    Authorization Type AETNA    PT Start Time 0804    PT Stop Time 0851    PT Time Calculation (min) 47 min    Activity Tolerance Patient tolerated treatment well    Behavior During Therapy [facility] for tasks assessed/performed               Past Medical History:  Diagnosis Date   Allergy    Arthritis    LUMBAR SPINE AND RIGHT HIP   Cataract    GERD (gastroesophageal reflux disease)    History of kidney stones 10/2012   Hyperlipidemia    Hypertension    Renal stone    Past Surgical History:  Procedure Laterality Date   ABDOMINAL HYSTERECTOMY  2000   CHOLECYSTECTOMY  04/09/2007   CYSTOSCOPY WITH URETEROSCOPY Right 10/02/2012   Procedure: CYSTOSCOPY WITH RIGHT RETROGRADE AND STENT PLACEMENT ;  Surgeon: Donnice Gwenyth Brooks, MD;  Location: [facility];  Service: Urology;  Laterality: Right;   CYSTOSCOPY WITH URETEROSCOPY Right 11/13/2012   Procedure: RIGHT URETEROSCOPY;  Surgeon: Donnice Gwenyth Brooks, MD;  Location: [facility];  Service: Urology;  Laterality: Right;  with Stent   CYSTOSCOPY/URETEROSCOPY/HOLMIUM LASER/STENT PLACEMENT Right 05/31/2022   Procedure: CYSTOSCOPY RIGHT URETEROSCOPY/HOLMIUM LASER/STENT PLACEMENT;  Surgeon: Brooks Donnice, MD;  Location: [facility];  Service: Urology;  Laterality: Right;  60 MINS   LITHOTRIPSY  10/06/2012   PUBOVAGINAL SLING N/A 04/15/2017   Procedure: CARLOYN GLADE;  Surgeon: Brooks Donnice, MD;  Location: [facility];  Service: Urology;  Laterality: N/A;  NEEDS 90 MIN FOR PROCEDURE   ROTATOR CUFF REPAIR Left    TYMPANOSTOMY TUBE PLACEMENT Right 2019   to clear up MRSA   There are no active problems to display for this patient.   REFERRING  PROVIDER: Beverley Evalene BIRCH, MD   REFERRING DIAG: presence of R artificial knee joint  L TKR   THERAPY DIAG:  Pain in both knees, unspecified chronicity  Stiffness of right knee, not elsewhere classified  Muscle weakness (generalized)  Difficulty in walking, not elsewhere classified  Rationale for Evaluation and Treatment: Rehabilitation  ONSET DATE: DOS  03/11/24  SUBJECTIVE:   SUBJECTIVE STATEMENT: Pt is 4 weeks and 1 day s/p R TKR.  Yesterday was a hard day.  I've had a tough 24-48 hours since prior therapy.  Pt states she had increased pain the evening after therapy.    PERTINENT HISTORY: R TKR 03/11/24 L TKR  12/04/2023 Arthritis in lumbar and R hip also HTN  PAIN:  Medial and lateral R knee  5/10 current     PRECAUTIONS: Other: bilat TKR    WEIGHT BEARING RESTRICTIONS: none indicated  FALLS:  Has patient fallen in last 6 months? Yes. Number of falls 4: R ankle rolled with new shoes ;  fell in the bathroom when the towel bar broke off the wall   on wet leaves, walking up a ramp  LIVING ENVIRONMENT: Lives with: lives with their spouse Lives in: 1 story home Stairs: 3 steps to enter home with 1 rail Has following equipment at home: FWW, crutches, SPC, 3 pronged cane  OCCUPATION: part time home care, primarily sitting  PLOF: Independent  PATIENT GOALS: to improve pain and mobility   OBJECTIVE:  Note: Objective measures were completed at Evaluation unless otherwise noted.  DIAGNOSTIC FINDINGS: Pt is post op.   PATIENT SURVEYS:  LEFS:  10/80  COGNITION: Overall cognitive status: Within functional limits for tasks assessed     OBSERVATION: Pt has an aquacell bandage over incision.  Aquacell has no drainage just a tiny spot on prox to mid drainage.  Pt has no band aids today and the 2 spots on R LE distal to bandage are healing well.  MD is aware of those spots.  Bruising has improved.    LOWER EXTREMITY ROM:   ROM Right eval Left eval  Right 12/18 Right 12/26 Right 12/29 Right 1/2 After PROM  Hip flexion        Hip extension        Hip abduction        Hip adduction        Hip internal rotation        Hip external rotation        Knee flexion 81 PROM; 77 AROM  94 AROM 107 AROM  123  Knee extension 4 AROM  1 AROM 1 AROM ; 0 deg after stretching 1 AROM ; 0 deg after stretching 0  Ankle dorsiflexion        Ankle plantarflexion        Ankle inversion        Ankle eversion         (Blank rows = not tested)    LOWER EXTREMITY MMT:   Strength not tested due to recent surgery and post op considerations.   FUNCTIONAL TESTS:  Pt required assistance with lifting LE with bed mobility.   GAIT: Comments: pt ambulating with FWW with a heel to toe gait with limited toe off and increased UE Wb'ing through walker.                                                                                                                                TREATMENT:    Scifit recumbent bike x 5 mins Pt received R knee flexion and extension PROM with gentle OP into extension in supine per pt and tissue tolerance.   supine SLR 2x10 without assistance  LAQ 2x10 Seated HS curl with YTB 2x10 TKE with YTB x10 1/4 squats 2x10 with UE support on rail Step ups 2 inch step and 4 inch step x10 each with UE support on rail  Pt ambulated with a SPC with SBA down the hallway and back.       PATIENT EDUCATION:  Education details:  Educated pt in post op and protocol expectations.  HEP, dx, prognosis, exercise form, and POC.  PT answered pt's questions.  Person educated: Patient Education method: Explanation, Demonstration, Tactile cues, Verbal cues, and Handouts Education comprehension: verbalized understanding, returned demonstration, verbal cues required, and tactile cues required  HOME  EXERCISE PROGRAM: Exercises - Supine Quadricep Sets  - 2 x daily - 7 x weekly - 2 sets - 10 reps - 5 seconds hold - Supine Gluteal Sets  - 2 x daily - 7  x weekly - 2 sets - 10 reps - 5 seconds hold - Supine Ankle Pumps  - 7 x weekly - Longsitting Heel Slide  - 2-3 x daily - 7 x weekly - 1 sets - 10 reps -Sitting with knee flexed for 3-5 mins comfortably at various times for 6-7 times/day   Access Code: [email or web address] URL: [email or web address] Date: 03/23/2024 Prepared by: Mose Minerva  Exercises - Supine Short Arc Quad  - 1 x daily - 7 x weekly - 2 sets - 10 reps - Small Range Straight Leg Raise  - 2 x daily - 7 x weekly - 1 sets - 7-10 reps - Long Sitting Calf Stretch with Strap  - 2 x daily - 7 x weekly - 2-3 reps - 20 seconds hold - Supine Heel Slide with Strap  - 2 x daily - 7 x weekly - 2 sets - 10 reps - Heel Raises with Counter Support  - 1 x daily - 7 x weekly - 2 sets - 10 reps - Standing Terminal Knee Extension with Resistance  - 1 x daily - 5-6 x weekly - 2 sets - 10 reps  ASSESSMENT:  CLINICAL IMPRESSION: Pt demonstrates excellent knee ROM.  PT assessed R knee AROM after PROM and she had 0-123 deg.  Pt able to perform complete revolutions on the bike.  Pt has quad weakness though is improving with supine SLR.  Pt performed exercises per protocol well.  PT increased height of step ups to 4 inches and she performed well.  PT practiced ambulating with a SPC and provided SBA.  Pt did reach out toward the wall for external support initially with gait.  PT instructed pt to continue to use the walker with gait though PT would practice more with cane.  She responded well to treatment reporting decreased pain to 3/10 after treatment.  She will benefit from cont skilled PT per protocol to improve pain, ROM, strength, gait, and functional mobility.   OBJECTIVE IMPAIRMENTS: Abnormal gait, decreased activity tolerance, decreased endurance, decreased knowledge of use of DME, decreased mobility, difficulty walking, decreased ROM, decreased strength, hypomobility, increased edema, impaired flexibility, and pain.    ACTIVITY  LIMITATIONS: bending, sitting, standing, squatting, stairs, transfers, bed mobility, dressing, and locomotion level   PARTICIPATION LIMITATIONS: meal prep, cleaning, laundry, driving, shopping, and community activity   PERSONAL FACTORS: 1-2 comorbidity: Arthritis and L TKR are also affecting patient's functional outcome.   REHAB POTENTIAL: Good  CLINICAL DECISION MAKING: Stable/uncomplicated  EVALUATION COMPLEXITY: Low   GOALS:   SHORT TERM GOALS:  Pt will be independen and compliant with HEP for improved pain, ROM, strength, and function. Baseline: Goal status: INITIAL Target date:  04/08/2024  2.  Pt will demo a good quad set and be able to perform a supine SLR independently for improved strength and stability with gait.  Baseline:  Goal status: INITIAL Target date:  04/01/2021  3.  Pt will demo improved L knee  AROM to 0-95 deg for improved gait, stiffness, and mobility.  Baseline:  Goal status: INITIAL Target date:  04/15/2024  4.  Pt will demo improved quality of gait and ambulate with a SPC with good stability.  Baseline:  Goal status: INITIAL Target date:  04/15/2024  5.   Pt will  be able to perform her normal transfers with no > than minimal difficulty.   Baseline:  Goal status: INITIAL Target date:  04/29/2024    LONG TERM GOALS: Target date: 05/27/2024   Pt will demo improved R knee AROM to 0 - 120 deg for improved mobility and stiffness and for improved performance of stairs.  Baseline:  Goal status: INITIAL  2.  Pt will ambulate with a normalized heel to toe gait without limping.  Baseline:  Goal status: INITIAL  3.  Pt will ambulate community distance without an AD without significant knee pain and difficulty.  Baseline:  Goal status: INITIAL  4.  Pt will be able to perform household chores without significant knee pain and difficulty.  Baseline:  Goal status: INITIAL  5.   Pt will demo improved strength to 4+/5 in R hip abd, 5/5 in Hip flexion, and  5/5 in knee ext and flexion for improved performance of daily functional mobility and to assist with returning to recreational activities.  Baseline:  Goal status: INITIAL  6.  Pt will be able to perform 4-5 steps with a reciprocal gait with the rail for improved performance of stairs.  Baseline:  Goal status: INITIAL   PLAN:  PT FREQUENCY: 2-3x/wk x 3 weeks and 2x/wk afterwards   PT DURATION: 10 weeks   PLANNED INTERVENTIONS: 97164- PT Re-evaluation, 97750- Physical Performance Testing, 97110-Therapeutic exercises, 97530- Therapeutic activity, 97112- Neuromuscular re-education, 97535- Self Care, 02859- Manual therapy, [phone]- Gait training, [phone]- Aquatic Therapy, [phone]- Electrical stimulation (unattended), Patient/Family education, Balance training, Stair training, Taping, Joint mobilization, Spinal mobilization, Scar mobilization, DME instructions, Cryotherapy, and Moist heat  PLAN FOR NEXT SESSION: Cont per TKR protocol.  Cont with gait training and ice.     Leigh Minerva III PT, DPT 04/09/2024 10:47 AM          "

## 2024-04-11 NOTE — Therapy (Signed)
 " OUTPATIENT PHYSICAL THERAPY LOWER EXTREMITY TREATMENT   Patient Name: Laura Mills MRN: [id number] DOB:1960-11-06, 64 y.o., female Today's Date: 04/13/2024  END OF SESSION:  PT End of Session - 04/12/24 1214     Visit Number 9    Number of Visits 22    Date for Recertification  05/27/24    Authorization Type AETNA    PT Start Time 1204    PT Stop Time 1246    PT Time Calculation (min) 42 min    Activity Tolerance Patient tolerated treatment well    Behavior During Therapy WFL for tasks assessed/performed                Past Medical History:  Diagnosis Date   Allergy    Arthritis    LUMBAR SPINE AND RIGHT HIP   Cataract    GERD (gastroesophageal reflux disease)    History of kidney stones 10/2012   Hyperlipidemia    Hypertension    Renal stone    Past Surgical History:  Procedure Laterality Date   ABDOMINAL HYSTERECTOMY  2000   CHOLECYSTECTOMY  04/09/2007   CYSTOSCOPY WITH URETEROSCOPY Right 10/02/2012   Procedure: CYSTOSCOPY WITH RIGHT RETROGRADE AND STENT PLACEMENT ;  Surgeon: Donnice Gwenyth Brooks, MD;  Location: [facility];  Service: Urology;  Laterality: Right;   CYSTOSCOPY WITH URETEROSCOPY Right 11/13/2012   Procedure: RIGHT URETEROSCOPY;  Surgeon: Donnice Gwenyth Brooks, MD;  Location: [facility];  Service: Urology;  Laterality: Right;  with Stent   CYSTOSCOPY/URETEROSCOPY/HOLMIUM LASER/STENT PLACEMENT Right 05/31/2022   Procedure: CYSTOSCOPY RIGHT URETEROSCOPY/HOLMIUM LASER/STENT PLACEMENT;  Surgeon: Brooks Donnice, MD;  Location: [facility];  Service: Urology;  Laterality: Right;  60 MINS   LITHOTRIPSY  10/06/2012   PUBOVAGINAL SLING N/A 04/15/2017   Procedure: CARLOYN GLADE;  Surgeon: Brooks Donnice, MD;  Location: [facility];  Service: Urology;  Laterality: N/A;  NEEDS 90 MIN FOR PROCEDURE   ROTATOR CUFF REPAIR Left    TYMPANOSTOMY TUBE PLACEMENT Right 2019   to clear up MRSA   There are no active problems to display for this  patient.   REFERRING PROVIDER: Beverley Evalene BIRCH, MD   REFERRING DIAG: presence of R artificial knee joint  L TKR   THERAPY DIAG:  Pain in both knees, unspecified chronicity  Stiffness of right knee, not elsewhere classified  Muscle weakness (generalized)  Difficulty in walking, not elsewhere classified  Rationale for Evaluation and Treatment: Rehabilitation  ONSET DATE: DOS  03/11/24  SUBJECTIVE:   SUBJECTIVE STATEMENT: Pt is 4 weeks and 4 days s/p R TKR.  Pt states her R knee feels stiff today.  She had to ice her knee more last night.  Pt can feel her incisions in both knees today when standing up and sitting down.  Pt reports she is performing her HEP.  Pt felt fine after prior treatment.     PERTINENT HISTORY: R TKR 03/11/24 L TKR  12/04/2023 Arthritis in lumbar and R hip also HTN  PAIN:  Medial and lateral R knee  3-4/10 current     PRECAUTIONS: Other: bilat TKR    WEIGHT BEARING RESTRICTIONS: none indicated  FALLS:  Has patient fallen in last 6 months? Yes. Number of falls 4: R ankle rolled with new shoes ;  fell in the bathroom when the towel bar broke off the wall   on wet leaves, walking up a ramp  LIVING ENVIRONMENT: Lives with: lives with their spouse Lives in: 1 story home Stairs: 3 steps to  enter home with 1 rail Has following equipment at home: FWW, crutches, SPC, 3 pronged cane  OCCUPATION: part time home care, primarily sitting   PLOF: Independent  PATIENT GOALS: to improve pain and mobility   OBJECTIVE:  Note: Objective measures were completed at Evaluation unless otherwise noted.  DIAGNOSTIC FINDINGS: Pt is post op.   PATIENT SURVEYS:  LEFS:  10/80  COGNITION: Overall cognitive status: Within functional limits for tasks assessed     OBSERVATION: Pt has an aquacell bandage over incision.  Aquacell has no drainage just a tiny spot on prox to mid drainage.  Pt has no band aids today and the 2 spots on R LE distal to bandage are  healing well.  MD is aware of those spots.  Bruising has improved.    LOWER EXTREMITY ROM:   ROM Right eval Left eval Right 12/18 Right 12/26 Right 12/29 Right 1/2 After PROM  Hip flexion        Hip extension        Hip abduction        Hip adduction        Hip internal rotation        Hip external rotation        Knee flexion 81 PROM; 77 AROM  94 AROM 107 AROM  123  Knee extension 4 AROM  1 AROM 1 AROM ; 0 deg after stretching 1 AROM ; 0 deg after stretching 0  Ankle dorsiflexion        Ankle plantarflexion        Ankle inversion        Ankle eversion         (Blank rows = not tested)    LOWER EXTREMITY MMT:   Strength not tested due to recent surgery and post op considerations.   FUNCTIONAL TESTS:  Pt required assistance with lifting LE with bed mobility.   GAIT: Comments: pt ambulating with FWW with a heel to toe gait with limited toe off and increased UE Wb'ing through walker.                                                                                                                                TREATMENT:    Scifit recumbent bike x 5 mins Pt received R knee flexion and extension PROM with gentle OP into extension in supine per pt and tissue tolerance.   supine SLR 3x10 without assistance  Supine heel slides 2x10 LAQ x12, 1# x10 Seated HS curl with YTB 2x10 TKE with YTB 2x10 1/4 squats 2x10 with UE support on rail Step ups 4 inch step 2x10 with UE support on rail Standing hip abd 2x10  R LE      PATIENT EDUCATION:  Education details:  Educated pt in post op and protocol expectations.  HEP, dx, prognosis, exercise form, and POC.  PT answered pt's questions.  Person educated: Patient Education method: Explanation, Demonstration, Tactile  cues, Verbal cues, and Handouts Education comprehension: verbalized understanding, returned demonstration, verbal cues required, and tactile cues required  HOME EXERCISE PROGRAM: Exercises - Supine Quadricep  Sets  - 2 x daily - 7 x weekly - 2 sets - 10 reps - 5 seconds hold - Supine Gluteal Sets  - 2 x daily - 7 x weekly - 2 sets - 10 reps - 5 seconds hold - Supine Ankle Pumps  - 7 x weekly - Longsitting Heel Slide  - 2-3 x daily - 7 x weekly - 1 sets - 10 reps -Sitting with knee flexed for 3-5 mins comfortably at various times for 6-7 times/day   Access Code: [email or web address] URL: [email or web address] Date: 03/23/2024 Prepared by: Mose Minerva  Exercises - Supine Short Arc Quad  - 1 x daily - 7 x weekly - 2 sets - 10 reps - Small Range Straight Leg Raise  - 2 x daily - 7 x weekly - 1 sets - 7-10 reps - Long Sitting Calf Stretch with Strap  - 2 x daily - 7 x weekly - 2-3 reps - 20 seconds hold - Supine Heel Slide with Strap  - 2 x daily - 7 x weekly - 2 sets - 10 reps - Heel Raises with Counter Support  - 1 x daily - 7 x weekly - 2 sets - 10 reps - Standing Terminal Knee Extension with Resistance  - 1 x daily - 5-6 x weekly - 2 sets - 10 reps  ASSESSMENT:  CLINICAL IMPRESSION: Pt continues to make excellent progress with knee ROM.  Pt requires cues to decrease extensor lag with supine SLR and demonstrates improved extensor lag with focus and cuing.  She is fatigued with SLR though continues to improve with SLR.  Pt is progressing appropriately with protocol.  Pt tolerated treatment well though was fatigued with exercises and at the end of treatment.  She states she will use ice when she returns home after treatment.  She will benefit from cont skilled PT per protocol to improve pain, ROM, strength, gait, and functional mobility.   OBJECTIVE IMPAIRMENTS: Abnormal gait, decreased activity tolerance, decreased endurance, decreased knowledge of use of DME, decreased mobility, difficulty walking, decreased ROM, decreased strength, hypomobility, increased edema, impaired flexibility, and pain.    ACTIVITY LIMITATIONS: bending, sitting, standing, squatting, stairs, transfers, bed mobility,  dressing, and locomotion level   PARTICIPATION LIMITATIONS: meal prep, cleaning, laundry, driving, shopping, and community activity   PERSONAL FACTORS: 1-2 comorbidity: Arthritis and L TKR are also affecting patient's functional outcome.   REHAB POTENTIAL: Good  CLINICAL DECISION MAKING: Stable/uncomplicated  EVALUATION COMPLEXITY: Low   GOALS:   SHORT TERM GOALS:  Pt will be independen and compliant with HEP for improved pain, ROM, strength, and function. Baseline: Goal status: INITIAL Target date:  04/08/2024  2.  Pt will demo a good quad set and be able to perform a supine SLR independently for improved strength and stability with gait.  Baseline:  Goal status: INITIAL Target date:  04/01/2021  3.  Pt will demo improved L knee  AROM to 0-95 deg for improved gait, stiffness, and mobility.  Baseline:  Goal status: INITIAL Target date:  04/15/2024  4.  Pt will demo improved quality of gait and ambulate with a SPC with good stability.  Baseline:  Goal status: INITIAL Target date:  04/15/2024  5.   Pt will be able to perform her normal transfers with no > than minimal difficulty.   Baseline:  Goal status:  INITIAL Target date:  04/29/2024    LONG TERM GOALS: Target date: 05/27/2024   Pt will demo improved R knee AROM to 0 - 120 deg for improved mobility and stiffness and for improved performance of stairs.  Baseline:  Goal status: INITIAL  2.  Pt will ambulate with a normalized heel to toe gait without limping.  Baseline:  Goal status: INITIAL  3.  Pt will ambulate community distance without an AD without significant knee pain and difficulty.  Baseline:  Goal status: INITIAL  4.  Pt will be able to perform household chores without significant knee pain and difficulty.  Baseline:  Goal status: INITIAL  5.   Pt will demo improved strength to 4+/5 in R hip abd, 5/5 in Hip flexion, and 5/5 in knee ext and flexion for improved performance of daily functional mobility  and to assist with returning to recreational activities.  Baseline:  Goal status: INITIAL  6.  Pt will be able to perform 4-5 steps with a reciprocal gait with the rail for improved performance of stairs.  Baseline:  Goal status: INITIAL   PLAN:  PT FREQUENCY: 2-3x/wk x 3 weeks and 2x/wk afterwards   PT DURATION: 10 weeks   PLANNED INTERVENTIONS: 97164- PT Re-evaluation, 97750- Physical Performance Testing, 97110-Therapeutic exercises, 97530- Therapeutic activity, 97112- Neuromuscular re-education, 97535- Self Care, 02859- Manual therapy, [phone]- Gait training, [phone]- Aquatic Therapy, [phone]- Electrical stimulation (unattended), Patient/Family education, Balance training, Stair training, Taping, Joint mobilization, Spinal mobilization, Scar mobilization, DME instructions, Cryotherapy, and Moist heat  PLAN FOR NEXT SESSION: Cont per TKR protocol.  Cont with gait training and ice.     Leigh Minerva III PT, DPT 04/13/2024 12:59 PM           "

## 2024-04-12 ENCOUNTER — Ambulatory Visit (HOSPITAL_BASED_OUTPATIENT_CLINIC_OR_DEPARTMENT_OTHER): Admitting: Physical Therapy

## 2024-04-12 ENCOUNTER — Encounter (HOSPITAL_BASED_OUTPATIENT_CLINIC_OR_DEPARTMENT_OTHER): Payer: Self-pay | Admitting: Physical Therapy

## 2024-04-12 DIAGNOSIS — M25562 Pain in left knee: Secondary | ICD-10-CM

## 2024-04-12 DIAGNOSIS — M25661 Stiffness of right knee, not elsewhere classified: Secondary | ICD-10-CM

## 2024-04-12 DIAGNOSIS — R262 Difficulty in walking, not elsewhere classified: Secondary | ICD-10-CM

## 2024-04-12 DIAGNOSIS — M25561 Pain in right knee: Secondary | ICD-10-CM | POA: Diagnosis not present

## 2024-04-12 DIAGNOSIS — M6281 Muscle weakness (generalized): Secondary | ICD-10-CM

## 2024-04-14 ENCOUNTER — Ambulatory Visit (HOSPITAL_BASED_OUTPATIENT_CLINIC_OR_DEPARTMENT_OTHER): Admitting: Physical Therapy

## 2024-04-14 ENCOUNTER — Encounter (HOSPITAL_BASED_OUTPATIENT_CLINIC_OR_DEPARTMENT_OTHER): Payer: Self-pay | Admitting: Physical Therapy

## 2024-04-14 DIAGNOSIS — M25561 Pain in right knee: Secondary | ICD-10-CM

## 2024-04-14 DIAGNOSIS — M6281 Muscle weakness (generalized): Secondary | ICD-10-CM

## 2024-04-14 DIAGNOSIS — R262 Difficulty in walking, not elsewhere classified: Secondary | ICD-10-CM

## 2024-04-14 DIAGNOSIS — M25661 Stiffness of right knee, not elsewhere classified: Secondary | ICD-10-CM

## 2024-04-14 NOTE — Therapy (Addendum)
 " OUTPATIENT PHYSICAL THERAPY LOWER EXTREMITY TREATMENT  PROGRESS NOTE   Patient Name: Laura Mills MRN: [id number] DOB:11-10-1960, 64 y.o., female Today's Date: 04/15/2024  END OF SESSION:  PT End of Session - 04/14/24 0942     Visit Number 10    Number of Visits 22    Date for Recertification  05/27/24    Authorization Type AETNA    PT Start Time 0851    PT Stop Time 0942    PT Time Calculation (min) 51 min    Activity Tolerance Patient tolerated treatment well    Behavior During Therapy [facility] for tasks assessed/performed                 Past Medical History:  Diagnosis Date   Allergy    Arthritis    LUMBAR SPINE AND RIGHT HIP   Cataract    GERD (gastroesophageal reflux disease)    History of kidney stones 10/2012   Hyperlipidemia    Hypertension    Renal stone    Past Surgical History:  Procedure Laterality Date   ABDOMINAL HYSTERECTOMY  2000   CHOLECYSTECTOMY  04/09/2007   CYSTOSCOPY WITH URETEROSCOPY Right 10/02/2012   Procedure: CYSTOSCOPY WITH RIGHT RETROGRADE AND STENT PLACEMENT ;  Surgeon: Donnice Gwenyth Brooks, MD;  Location: [facility];  Service: Urology;  Laterality: Right;   CYSTOSCOPY WITH URETEROSCOPY Right 11/13/2012   Procedure: RIGHT URETEROSCOPY;  Surgeon: Donnice Gwenyth Brooks, MD;  Location: [facility];  Service: Urology;  Laterality: Right;  with Stent   CYSTOSCOPY/URETEROSCOPY/HOLMIUM LASER/STENT PLACEMENT Right 05/31/2022   Procedure: CYSTOSCOPY RIGHT URETEROSCOPY/HOLMIUM LASER/STENT PLACEMENT;  Surgeon: Brooks Donnice, MD;  Location: [facility];  Service: Urology;  Laterality: Right;  60 MINS   LITHOTRIPSY  10/06/2012   PUBOVAGINAL SLING N/A 04/15/2017   Procedure: CARLOYN GLADE;  Surgeon: Brooks Donnice, MD;  Location: [facility];  Service: Urology;  Laterality: N/A;  NEEDS 90 MIN FOR PROCEDURE   ROTATOR CUFF REPAIR Left    TYMPANOSTOMY TUBE PLACEMENT Right 2019   to clear up MRSA   There are no active problems to display for this  patient.   REFERRING PROVIDER: Beverley Evalene BIRCH, MD   REFERRING DIAG: presence of R artificial knee joint  L TKR   THERAPY DIAG:  Pain in both knees, unspecified chronicity  Stiffness of right knee, not elsewhere classified  Muscle weakness (generalized)  Difficulty in walking, not elsewhere classified  Rationale for Evaluation and Treatment: Rehabilitation  ONSET DATE: DOS  03/11/24  SUBJECTIVE:   SUBJECTIVE STATEMENT: Pt is 4 weeks and 6 days s/p R TKR.  Pt used ice after prior treatment and denies any adverse effects after prior treatment.  Pt states she had a good day yesterday and didn't use any ice yesterday.   FUNCTIONAL IMPROVEMENTS:  donning shoe, walking, sitting with knees bent, dressing, ROM, transfers, bed mobility.  Standing for a short duration to cook or wash dishes  FUNCTIONAL LIMITATIONS:  ambulation, standing up from a lower position, using a walker with ambulation, household chores  Pt reports she is performing her HEP.       PERTINENT HISTORY: R TKR 03/11/24 L TKR  12/04/2023 Arthritis in lumbar and R hip also HTN  PAIN:  Anterior R knee, incision  2-3/10 current       PRECAUTIONS: Other: bilat TKR    WEIGHT BEARING RESTRICTIONS: none indicated  FALLS:  Has patient fallen in last 6 months? Yes. Number of falls 4: R ankle rolled with new shoes ;  fell in the bathroom when the towel bar broke off the wall   on wet leaves, walking up a ramp  LIVING ENVIRONMENT: Lives with: lives with their spouse Lives in: 1 story home Stairs: 3 steps to enter home with 1 rail Has following equipment at home: FWW, crutches, SPC, 3 pronged cane  OCCUPATION: part time home care, primarily sitting   PLOF: Independent  PATIENT GOALS: to improve pain and mobility   OBJECTIVE:  Note: Objective measures were completed at Evaluation unless otherwise noted.  DIAGNOSTIC FINDINGS: Pt is post op.   PATIENT SURVEYS:  LEFS:  10/80  (Eval) LEFS:   32/80   (04/14/24)   COGNITION: Overall cognitive status: Within functional limits for tasks assessed     OBSERVATION: Pt has an aquacell bandage over incision.  Aquacell has no drainage just a tiny spot on prox to mid drainage.  Pt has no band aids today and the 2 spots on R LE distal to bandage are healing well.  MD is aware of those spots.  Bruising has improved.    LOWER EXTREMITY ROM:   ROM Right eval Left eval Right 12/18 Right 12/26 Right 12/29 Right 1/2 After PROM Right 1/6 Before PROM  Hip flexion         Hip extension         Hip abduction         Hip adduction         Hip internal rotation         Hip external rotation         Knee flexion 81 PROM; 77 AROM  94 AROM 107 AROM  123 122  Knee extension 4 AROM  1 AROM 1 AROM ; 0 deg after stretching 1 AROM ; 0 deg after stretching 0 0  Ankle dorsiflexion         Ankle plantarflexion         Ankle inversion         Ankle eversion          (Blank rows = not tested)    LOWER EXTREMITY MMT:   Strength not tested due to recent surgery and post op considerations.   FUNCTIONAL TESTS:  Pt required assistance with lifting LE with bed mobility.   GAIT: Comments: pt ambulating with FWW with a heel to toe gait with limited toe off and increased UE Wb'ing through walker.                                                                                                                                TREATMENT:   Reviewed functional improvements and limitations, response to prior treatment, HEP compliance, and pain levels. Scifit recumbent bike x 5 mins  Gait: Pt ambulated with a FWW with a heel to toe gait pattern with good stability.  At times, Pt has slight limitations with TKE.  PT had pt practice gait with a SPC ambulating  in the hallway multiple times.  She had no instability with gait with SPC.  She had a minimal LOB which she self corrected when turning.  At times, pt had very NBOS and crossed her feet.  PT provided  instruction in stepping/foot placement including proper BOS.  PT assessed knee ROM.  See above Pt performs supine SLR well independently with slight extensor lag.    Supine SLR 1# 2x10 LAQ 1# 2x10 Seated HS curl with YTB 2x10 Step ups 4 inch step x10 with UE support on rail Lateral step ups on 4 inch x 5, 2 inch x 10         PATIENT EDUCATION:  Education details:  Educated pt in post op and protocol expectations.  HEP, dx, prognosis, exercise form, and POC.  PT answered pt's questions.  Person educated: Patient Education method: Explanation, Demonstration, Tactile cues, Verbal cues, and Handouts Education comprehension: verbalized understanding, returned demonstration, verbal cues required, and tactile cues required  HOME EXERCISE PROGRAM: Exercises - Supine Quadricep Sets  - 2 x daily - 7 x weekly - 2 sets - 10 reps - 5 seconds hold - Supine Gluteal Sets  - 2 x daily - 7 x weekly - 2 sets - 10 reps - 5 seconds hold - Supine Ankle Pumps  - 7 x weekly - Longsitting Heel Slide  - 2-3 x daily - 7 x weekly - 1 sets - 10 reps -Sitting with knee flexed for 3-5 mins comfortably at various times for 6-7 times/day   Access Code: [email or web address] URL: [email or web address] Date: 03/23/2024 Prepared by: Mose Minerva  Exercises - Supine Short Arc Quad  - 1 x daily - 7 x weekly - 2 sets - 10 reps - Small Range Straight Leg Raise  - 2 x daily - 7 x weekly - 1 sets - 7-10 reps - Long Sitting Calf Stretch with Strap  - 2 x daily - 7 x weekly - 2-3 reps - 20 seconds hold - Supine Heel Slide with Strap  - 2 x daily - 7 x weekly - 2 sets - 10 reps - Heel Raises with Counter Support  - 1 x daily - 7 x weekly - 2 sets - 10 reps - Standing Terminal Knee Extension with Resistance  - 1 x daily - 5-6 x weekly - 2 sets - 10 reps  ASSESSMENT:  CLINICAL IMPRESSION: Pt has made excellent progress with knee ROM.  She is progressing appropriately with protocol.  Pt is progressing well with her  ADL's and functional mobility as evidenced by subjective reports.  Pt requires cues to decrease extensor lag with supine SLR and demonstrates improved extensor lag with focus and cuing.  She is improving with SLR.  Pt performed exercises well.  Pt is ambulating with a FWW.  PT worked on gait with SPC and instructed pt in proper gait.  Overall, she did well with SPC.  She had one minimal LOB with SPC when turning which she self corrected.  PT provided cuing for stepping and foot placement.  Pt has some difficulty with 4 inch step ups and reports feeling some burning pain in knee.  PT provided cuing and instruction in correct form with 4 inch step ups.  Pt demonstrates clinically significant improvement in self perceived disability with LEFS improvement.  Pt has met STG's #1,2 and LTG #1.  Pt tolerated treatment well and will benefit from cont skilled PT per protocol to address ongoing goals and improve pain, ROM, strength, gait, and  functional mobility.      OBJECTIVE IMPAIRMENTS: Abnormal gait, decreased activity tolerance, decreased endurance, decreased knowledge of use of DME, decreased mobility, difficulty walking, decreased ROM, decreased strength, hypomobility, increased edema, impaired flexibility, and pain.    ACTIVITY LIMITATIONS: bending, sitting, standing, squatting, stairs, transfers, bed mobility, dressing, and locomotion level   PARTICIPATION LIMITATIONS: meal prep, cleaning, laundry, driving, shopping, and community activity   PERSONAL FACTORS: 1-2 comorbidity: Arthritis and L TKR are also affecting patient's functional outcome.   REHAB POTENTIAL: Good  CLINICAL DECISION MAKING: Stable/uncomplicated  EVALUATION COMPLEXITY: Low   GOALS:   SHORT TERM GOALS:  Pt will be independent and compliant with HEP for improved pain, ROM, strength, and function. Baseline: Goal status:  GOAL MET  1/7 Target date:  04/08/2024  2.  Pt will demo a good quad set and be able to perform a supine  SLR independently for improved strength and stability with gait.  Baseline:  Goal status: GOAL MET   Target date:  04/01/2021  3.  Pt will demo improved L knee  AROM to 0-95 deg for improved gait, stiffness, and mobility.  Baseline:  Goal status: GOAL MET   Target date:  04/15/2024  4.  Pt will demo improved quality of gait and ambulate with a SPC with good stability.  Baseline:  Goal status: PROGRESSING  1/7 Target date:  04/15/2024  5.   Pt will be able to perform her normal transfers with no > than minimal difficulty.   Baseline:  Goal status: ONGOING Target date:  04/29/2024    LONG TERM GOALS: Target date: 05/27/2024   Pt will demo improved R knee AROM to 0 - 120 deg for improved mobility and stiffness and for improved performance of stairs.  Baseline:  Goal status: GOAL MET  1/7  2.  Pt will ambulate with a normalized heel to toe gait without limping.  Baseline:  Goal status:PROGRESSING  3.  Pt will ambulate community distance without an AD without significant knee pain and difficulty.  Baseline:  Goal status: INITIAL  4.  Pt will be able to perform household chores without significant knee pain and difficulty.  Baseline:  Goal status: INITIAL  5.   Pt will demo improved strength to 4+/5 in R hip abd, 5/5 in Hip flexion, and 5/5 in knee ext and flexion for improved performance of daily functional mobility and to assist with returning to recreational activities.  Baseline:  Goal status: ONGOING  6.  Pt will be able to perform 4-5 steps with a reciprocal gait with the rail for improved performance of stairs.  Baseline:  Goal status: INITIAL   PLAN:  PT FREQUENCY: 2-3x/wk x 1-2 weeks and 2x/wk afterwards   PT DURATION: 6-7 weeks   PLANNED INTERVENTIONS: 97164- PT Re-evaluation, 97750- Physical Performance Testing, 97110-Therapeutic exercises, 97530- Therapeutic activity, 97112- Neuromuscular re-education, 97535- Self Care, 02859- Manual therapy, [phone]- Gait  training, [phone]- Aquatic Therapy, [phone]- Electrical stimulation (unattended), Patient/Family education, Balance training, Stair training, Taping, Joint mobilization, Spinal mobilization, Scar mobilization, DME instructions, Cryotherapy, and Moist heat  PLAN FOR NEXT SESSION: Cont per TKR protocol.  Cont with gait training and ice.     Leigh Minerva III PT, DPT 04/15/2024 12:42 PM            "

## 2024-04-15 ENCOUNTER — Encounter (HOSPITAL_BASED_OUTPATIENT_CLINIC_OR_DEPARTMENT_OTHER): Admitting: Physical Therapy

## 2024-04-16 ENCOUNTER — Encounter (HOSPITAL_BASED_OUTPATIENT_CLINIC_OR_DEPARTMENT_OTHER): Payer: Self-pay | Admitting: Physical Therapy

## 2024-04-16 ENCOUNTER — Ambulatory Visit (HOSPITAL_BASED_OUTPATIENT_CLINIC_OR_DEPARTMENT_OTHER): Admitting: Physical Therapy

## 2024-04-16 DIAGNOSIS — M6281 Muscle weakness (generalized): Secondary | ICD-10-CM

## 2024-04-16 DIAGNOSIS — M25661 Stiffness of right knee, not elsewhere classified: Secondary | ICD-10-CM

## 2024-04-16 DIAGNOSIS — R262 Difficulty in walking, not elsewhere classified: Secondary | ICD-10-CM

## 2024-04-16 DIAGNOSIS — M25561 Pain in right knee: Secondary | ICD-10-CM | POA: Diagnosis not present

## 2024-04-16 NOTE — Therapy (Signed)
 " OUTPATIENT PHYSICAL THERAPY LOWER EXTREMITY TREATMENT     Patient Name: Laura Mills MRN: [id number] DOB:1961/03/15, 64 y.o., female Today's Date: 04/16/2024  END OF SESSION:  PT End of Session - 04/16/24 0826     Visit Number 11    Number of Visits 22    Date for Recertification  05/27/24    Authorization Type AETNA    PT Start Time 0804                 Past Medical History:  Diagnosis Date   Allergy    Arthritis    LUMBAR SPINE AND RIGHT HIP   Cataract    GERD (gastroesophageal reflux disease)    History of kidney stones 10/2012   Hyperlipidemia    Hypertension    Renal stone    Past Surgical History:  Procedure Laterality Date   ABDOMINAL HYSTERECTOMY  2000   CHOLECYSTECTOMY  04/09/2007   CYSTOSCOPY WITH URETEROSCOPY Right 10/02/2012   Procedure: CYSTOSCOPY WITH RIGHT RETROGRADE AND STENT PLACEMENT ;  Surgeon: Donnice Gwenyth Brooks, MD;  Location: [facility];  Service: Urology;  Laterality: Right;   CYSTOSCOPY WITH URETEROSCOPY Right 11/13/2012   Procedure: RIGHT URETEROSCOPY;  Surgeon: Donnice Gwenyth Brooks, MD;  Location: [facility];  Service: Urology;  Laterality: Right;  with Stent   CYSTOSCOPY/URETEROSCOPY/HOLMIUM LASER/STENT PLACEMENT Right 05/31/2022   Procedure: CYSTOSCOPY RIGHT URETEROSCOPY/HOLMIUM LASER/STENT PLACEMENT;  Surgeon: Brooks Donnice, MD;  Location: [facility];  Service: Urology;  Laterality: Right;  60 MINS   LITHOTRIPSY  10/06/2012   PUBOVAGINAL SLING N/A 04/15/2017   Procedure: CARLOYN GLADE;  Surgeon: Brooks Donnice, MD;  Location: [facility];  Service: Urology;  Laterality: N/A;  NEEDS 90 MIN FOR PROCEDURE   ROTATOR CUFF REPAIR Left    TYMPANOSTOMY TUBE PLACEMENT Right 2019   to clear up MRSA   There are no active problems to display for this patient.   REFERRING PROVIDER: Beverley Evalene BIRCH, MD   REFERRING DIAG: presence of R artificial knee joint  L TKR   THERAPY DIAG:  Pain in both knees, unspecified  chronicity  Stiffness of right knee, not elsewhere classified  Muscle weakness (generalized)  Difficulty in walking, not elsewhere classified  Rationale for Evaluation and Treatment: Rehabilitation  ONSET DATE: DOS  03/11/24  SUBJECTIVE:   SUBJECTIVE STATEMENT: Pt is 5 weeks and 1 day s/p R TKR.  Pt hasn't been hurting as much the past few few days.  Pt denies any adverse effects after prior treatment.   Pt used ice after prior treatment.  Pt has been using the bone foam for extension at home.   Pt states she had a good day yesterday and didn't use any ice yesterday.   FUNCTIONAL IMPROVEMENTS:  donning shoe, walking, sitting with knees bent, dressing, ROM, transfers, bed mobility.  Standing for a short duration to cook or wash dishes  FUNCTIONAL LIMITATIONS:  ambulation, standing up from a lower position, using a walker with ambulation, household chores  Pt reports she is performing her HEP.       PERTINENT HISTORY: R TKR 03/11/24 L TKR  12/04/2023 Arthritis in lumbar and R hip also HTN  PAIN:  Anterior R knee, incision  1-2/10 current       PRECAUTIONS: Other: bilat TKR    WEIGHT BEARING RESTRICTIONS: none indicated  FALLS:  Has patient fallen in last 6 months? Yes. Number of falls 4: R ankle rolled with new shoes ;  fell in the bathroom when the towel  bar broke off the wall   on wet leaves, walking up a ramp  LIVING ENVIRONMENT: Lives with: lives with their spouse Lives in: 1 story home Stairs: 3 steps to enter home with 1 rail Has following equipment at home: FWW, crutches, SPC, 3 pronged cane  OCCUPATION: part time home care, primarily sitting   PLOF: Independent  PATIENT GOALS: to improve pain and mobility   OBJECTIVE:  Note: Objective measures were completed at Evaluation unless otherwise noted.  DIAGNOSTIC FINDINGS: Pt is post op.   PATIENT SURVEYS:  LEFS:  10/80  (Eval) LEFS:   32/80  (04/14/24)   COGNITION: Overall cognitive status: Within  functional limits for tasks assessed     OBSERVATION: Pt has an aquacell bandage over incision.  Aquacell has no drainage just a tiny spot on prox to mid drainage.  Pt has no band aids today and the 2 spots on R LE distal to bandage are healing well.  MD is aware of those spots.  Bruising has improved.    LOWER EXTREMITY ROM:   ROM Right eval Left eval Right 12/18 Right 12/26 Right 12/29 Right 1/2 After PROM Right 1/6 Before PROM  Hip flexion         Hip extension         Hip abduction         Hip adduction         Hip internal rotation         Hip external rotation         Knee flexion 81 PROM; 77 AROM  94 AROM 107 AROM  123 122  Knee extension 4 AROM  1 AROM 1 AROM ; 0 deg after stretching 1 AROM ; 0 deg after stretching 0 0  Ankle dorsiflexion         Ankle plantarflexion         Ankle inversion         Ankle eversion          (Blank rows = not tested)    LOWER EXTREMITY MMT:   Strength not tested due to recent surgery and post op considerations.   FUNCTIONAL TESTS:  Pt required assistance with lifting LE with bed mobility.   GAIT: Comments: pt ambulating with FWW with a heel to toe gait with limited toe off and increased UE Wb'ing through walker.                                                                                                                                TREATMENT:   Scifit recumbent bike x 6 mins Supine SLR 1# 2x10 LAQ 1# x10, 2# 2x10 Seated HS curl with YTB 3x10 Mini squats 2x10 Step ups 4 inch step 2x10 with UE support on rail Lateral step ups on 4 inch x 10   Gait: Pt ambulated with a SPC  s slight limitations with TKE.  PT had pt practice gait  with a SPC ambulating in the hallway multiple times.  She had no instability with gait with SPC.  She had a minimal LOB which she self corrected when turning.  At times, pt had very NBOS and crossed her feet.  PT provided instruction in stepping/foot placement including proper BOS.      PATIENT EDUCATION:  Education details:  Educated pt in post op and protocol expectations.  HEP, dx, prognosis, exercise form, and POC.  PT answered pt's questions.  Person educated: Patient Education method: Explanation, Demonstration, Tactile cues, Verbal cues, and Handouts Education comprehension: verbalized understanding, returned demonstration, verbal cues required, and tactile cues required  HOME EXERCISE PROGRAM: Exercises - Supine Quadricep Sets  - 2 x daily - 7 x weekly - 2 sets - 10 reps - 5 seconds hold - Supine Gluteal Sets  - 2 x daily - 7 x weekly - 2 sets - 10 reps - 5 seconds hold - Supine Ankle Pumps  - 7 x weekly - Longsitting Heel Slide  - 2-3 x daily - 7 x weekly - 1 sets - 10 reps -Sitting with knee flexed for 3-5 mins comfortably at various times for 6-7 times/day   Access Code: [email or web address] URL: [email or web address] Date: 03/23/2024 Prepared by: Mose Minerva  Exercises - Supine Short Arc Quad  - 1 x daily - 7 x weekly - 2 sets - 10 reps - Small Range Straight Leg Raise  - 2 x daily - 7 x weekly - 1 sets - 7-10 reps - Long Sitting Calf Stretch with Strap  - 2 x daily - 7 x weekly - 2-3 reps - 20 seconds hold - Supine Heel Slide with Strap  - 2 x daily - 7 x weekly - 2 sets - 10 reps - Heel Raises with Counter Support  - 1 x daily - 7 x weekly - 2 sets - 10 reps - Standing Terminal Knee Extension with Resistance  - 1 x daily - 5-6 x weekly - 2 sets - 10 reps  ASSESSMENT:  CLINICAL IMPRESSION: Pt has made excellent progress with knee ROM.  She is progressing appropriately with protocol.  Pt is progressing well with her ADL's and functional mobility as evidenced by subjective reports.  Pt requires cues to decrease extensor lag with supine SLR and demonstrates improved extensor lag with focus and cuing.  She is improving with SLR.  Pt performed exercises well.  Pt is ambulating with a FWW.  PT worked on gait with SPC and instructed pt in proper gait.   Overall, she did well with SPC.  She had one minimal LOB with SPC when turning which she self corrected.  PT provided cuing for stepping and foot placement.  Pt has some difficulty with 4 inch step ups and reports feeling some burning pain in knee.  PT provided cuing and instruction in correct form with 4 inch step ups.  Pt demonstrates clinically significant improvement in self perceived disability with LEFS improvement.  Pt has met STG's #1,2 and LTG #1.  Pt tolerated treatment well and will benefit from cont skilled PT per protocol to address ongoing goals and improve pain, ROM, strength, gait, and functional mobility.  Improved stepping/foot placement having improved BOS      OBJECTIVE IMPAIRMENTS: Abnormal gait, decreased activity tolerance, decreased endurance, decreased knowledge of use of DME, decreased mobility, difficulty walking, decreased ROM, decreased strength, hypomobility, increased edema, impaired flexibility, and pain.    ACTIVITY LIMITATIONS: bending, sitting, standing, squatting, stairs, transfers, bed mobility,  dressing, and locomotion level   PARTICIPATION LIMITATIONS: meal prep, cleaning, laundry, driving, shopping, and community activity   PERSONAL FACTORS: 1-2 comorbidity: Arthritis and L TKR are also affecting patient's functional outcome.   REHAB POTENTIAL: Good  CLINICAL DECISION MAKING: Stable/uncomplicated  EVALUATION COMPLEXITY: Low   GOALS:   SHORT TERM GOALS:  Pt will be independent and compliant with HEP for improved pain, ROM, strength, and function. Baseline: Goal status:  GOAL MET  1/7 Target date:  04/08/2024  2.  Pt will demo a good quad set and be able to perform a supine SLR independently for improved strength and stability with gait.  Baseline:  Goal status: GOAL MET   Target date:  04/01/2021  3.  Pt will demo improved L knee  AROM to 0-95 deg for improved gait, stiffness, and mobility.  Baseline:  Goal status: GOAL MET   Target date:   04/15/2024  4.  Pt will demo improved quality of gait and ambulate with a SPC with good stability.  Baseline:  Goal status: PROGRESSING  1/7 Target date:  04/15/2024  5.   Pt will be able to perform her normal transfers with no > than minimal difficulty.   Baseline:  Goal status: ONGOING Target date:  04/29/2024    LONG TERM GOALS: Target date: 05/27/2024   Pt will demo improved R knee AROM to 0 - 120 deg for improved mobility and stiffness and for improved performance of stairs.  Baseline:  Goal status: GOAL MET  1/7  2.  Pt will ambulate with a normalized heel to toe gait without limping.  Baseline:  Goal status:PROGRESSING  3.  Pt will ambulate community distance without an AD without significant knee pain and difficulty.  Baseline:  Goal status: INITIAL  4.  Pt will be able to perform household chores without significant knee pain and difficulty.  Baseline:  Goal status: INITIAL  5.   Pt will demo improved strength to 4+/5 in R hip abd, 5/5 in Hip flexion, and 5/5 in knee ext and flexion for improved performance of daily functional mobility and to assist with returning to recreational activities.  Baseline:  Goal status: ONGOING  6.  Pt will be able to perform 4-5 steps with a reciprocal gait with the rail for improved performance of stairs.  Baseline:  Goal status: INITIAL   PLAN:  PT FREQUENCY: 2-3x/wk x 1-2 weeks and 2x/wk afterwards   PT DURATION: 6-7 weeks   PLANNED INTERVENTIONS: 97164- PT Re-evaluation, 97750- Physical Performance Testing, 97110-Therapeutic exercises, 97530- Therapeutic activity, 97112- Neuromuscular re-education, 97535- Self Care, 02859- Manual therapy, [phone]- Gait training, [phone]- Aquatic Therapy, [phone]- Electrical stimulation (unattended), Patient/Family education, Balance training, Stair training, Taping, Joint mobilization, Spinal mobilization, Scar mobilization, DME instructions, Cryotherapy, and Moist heat  PLAN FOR NEXT SESSION: Cont per  TKR protocol.  Cont with gait training and ice.     Leigh Minerva III PT, DPT 04/16/2024 8:26 AM            "

## 2024-04-18 NOTE — Therapy (Signed)
 " OUTPATIENT PHYSICAL THERAPY LOWER EXTREMITY TREATMENT     Patient Name: Laura Mills MRN: [id number] DOB:1961-03-31, 64 y.o., female Today's Date: 04/20/2024  END OF SESSION:  PT End of Session - 04/19/24 1205     Visit Number 12    Number of Visits 22    Date for Recertification  05/27/24    Authorization Type AETNA    PT Start Time 1159    PT Stop Time 1241    PT Time Calculation (min) 42 min    Activity Tolerance Patient tolerated treatment well    Behavior During Therapy WFL for tasks assessed/performed                  Past Medical History:  Diagnosis Date   Allergy    Arthritis    LUMBAR SPINE AND RIGHT HIP   Cataract    GERD (gastroesophageal reflux disease)    History of kidney stones 10/2012   Hyperlipidemia    Hypertension    Renal stone    Past Surgical History:  Procedure Laterality Date   ABDOMINAL HYSTERECTOMY  2000   CHOLECYSTECTOMY  04/09/2007   CYSTOSCOPY WITH URETEROSCOPY Right 10/02/2012   Procedure: CYSTOSCOPY WITH RIGHT RETROGRADE AND STENT PLACEMENT ;  Surgeon: Donnice Gwenyth Brooks, MD;  Location: [facility];  Service: Urology;  Laterality: Right;   CYSTOSCOPY WITH URETEROSCOPY Right 11/13/2012   Procedure: RIGHT URETEROSCOPY;  Surgeon: Donnice Gwenyth Brooks, MD;  Location: [facility];  Service: Urology;  Laterality: Right;  with Stent   CYSTOSCOPY/URETEROSCOPY/HOLMIUM LASER/STENT PLACEMENT Right 05/31/2022   Procedure: CYSTOSCOPY RIGHT URETEROSCOPY/HOLMIUM LASER/STENT PLACEMENT;  Surgeon: Brooks Donnice, MD;  Location: [facility];  Service: Urology;  Laterality: Right;  60 MINS   LITHOTRIPSY  10/06/2012   PUBOVAGINAL SLING N/A 04/15/2017   Procedure: CARLOYN GLADE;  Surgeon: Brooks Donnice, MD;  Location: [facility];  Service: Urology;  Laterality: N/A;  NEEDS 90 MIN FOR PROCEDURE   ROTATOR CUFF REPAIR Left    TYMPANOSTOMY TUBE PLACEMENT Right 2019   to clear up MRSA   There are no active problems to display for this  patient.   REFERRING PROVIDER: Beverley Evalene BIRCH, MD   REFERRING DIAG: presence of R artificial knee joint  L TKR   THERAPY DIAG:  Pain in both knees, unspecified chronicity  Stiffness of right knee, not elsewhere classified  Muscle weakness (generalized)  Difficulty in walking, not elsewhere classified  Rationale for Evaluation and Treatment: Rehabilitation  ONSET DATE: DOS  03/11/24  SUBJECTIVE:   SUBJECTIVE STATEMENT: Pt is 5 weeks and 4 days s/p R TKR.  Pt hasn't been hurting as much the past few few days.  Pt denies any adverse effects after prior treatment.  Pt is performing her HEP.       PERTINENT HISTORY: R TKR 03/11/24 L TKR  12/04/2023 Arthritis in lumbar and R hip also HTN  PAIN:  Anterior R knee, incision  1-2/10 current       PRECAUTIONS: Other: bilat TKR    WEIGHT BEARING RESTRICTIONS: none indicated  FALLS:  Has patient fallen in last 6 months? Yes. Number of falls 4: R ankle rolled with new shoes ;  fell in the bathroom when the towel bar broke off the wall   on wet leaves, walking up a ramp  LIVING ENVIRONMENT: Lives with: lives with their spouse Lives in: 1 story home Stairs: 3 steps to enter home with 1 rail Has following equipment at home: FWW, crutches, SPC, 3 pronged cane  OCCUPATION: part time home care, primarily sitting   PLOF: Independent  PATIENT GOALS: to improve pain and mobility   OBJECTIVE:  Note: Objective measures were completed at Evaluation unless otherwise noted.  DIAGNOSTIC FINDINGS: Pt is post op.   PATIENT SURVEYS:  LEFS:  10/80  (Eval) LEFS:   32/80  (04/14/24)   COGNITION: Overall cognitive status: Within functional limits for tasks assessed     OBSERVATION: Pt has an aquacell bandage over incision.  Aquacell has no drainage just a tiny spot on prox to mid drainage.  Pt has no band aids today and the 2 spots on R LE distal to bandage are healing well.  MD is aware of those spots.  Bruising has improved.     LOWER EXTREMITY ROM:   ROM Right eval Left eval Right 12/18 Right 12/26 Right 12/29 Right 1/2 After PROM Right 1/6 Before PROM  Hip flexion         Hip extension         Hip abduction         Hip adduction         Hip internal rotation         Hip external rotation         Knee flexion 81 PROM; 77 AROM  94 AROM 107 AROM  123 122  Knee extension 4 AROM  1 AROM 1 AROM ; 0 deg after stretching 1 AROM ; 0 deg after stretching 0 0  Ankle dorsiflexion         Ankle plantarflexion         Ankle inversion         Ankle eversion          (Blank rows = not tested)    LOWER EXTREMITY MMT:   Strength not tested due to recent surgery and post op considerations.   FUNCTIONAL TESTS:  Pt required assistance with lifting LE with bed mobility.   GAIT: Comments: pt ambulating with FWW with a heel to toe gait with limited toe off and increased UE Wb'ing through walker.                                                                                                                                TREATMENT:   Scifit recumbent bike x 6 mins  Supine SLR 1# x10,  0# x10 reps Attempted S/L hip abd though pt unable to perform LAQ 2# 3x10 Seated HS curl with YTB 3x10 TKE with RTB 3x10 Standing hip abduction with YTB above knees 2x10 Step ups 4 inch step 2x10 with UE support on rail   Gait: Pt ambulated with a SPC x 2 laps around the clinic.      PATIENT EDUCATION:  Education details:  Educated pt in post op and protocol expectations.  HEP, dx, prognosis, exercise form, and POC.  PT answered pt's questions.  Person educated: Patient Education method: Explanation, Demonstration, Tactile cues,  Verbal cues, and Handouts Education comprehension: verbalized understanding, returned demonstration, verbal cues required, and tactile cues required  HOME EXERCISE PROGRAM: Exercises - Supine Quadricep Sets  - 2 x daily - 7 x weekly - 2 sets - 10 reps - 5 seconds hold - Supine Gluteal Sets   - 2 x daily - 7 x weekly - 2 sets - 10 reps - 5 seconds hold - Supine Ankle Pumps  - 7 x weekly - Longsitting Heel Slide  - 2-3 x daily - 7 x weekly - 1 sets - 10 reps -Sitting with knee flexed for 3-5 mins comfortably at various times for 6-7 times/day   Access Code: [email or web address] URL: [email or web address] Date: 03/23/2024 Prepared by: Mose Minerva  Exercises - Supine Short Arc Quad  - 1 x daily - 7 x weekly - 2 sets - 10 reps - Small Range Straight Leg Raise  - 2 x daily - 7 x weekly - 1 sets - 7-10 reps - Long Sitting Calf Stretch with Strap  - 2 x daily - 7 x weekly - 2-3 reps - 20 seconds hold - Supine Heel Slide with Strap  - 2 x daily - 7 x weekly - 2 sets - 10 reps - Heel Raises with Counter Support  - 1 x daily - 7 x weekly - 2 sets - 10 reps - Standing Terminal Knee Extension with Resistance  - 1 x daily - 5-6 x weekly - 2 sets - 10 reps  ASSESSMENT:  CLINICAL IMPRESSION: Pt ambulated well with SPC in the clinic.  Pt had a minimal lag with supine SLR.  Pt was fatigued with supine SLR and PT decreased the resistance.  Pt unable to perform S/L hip abduction.  PT had pt perform standing hip abd with light resistance to work glute med.  PT gave cues for correct form with step ups and she demonstrates improved performance of 4 inch step ups.  Pt had improved control with step ups today.  She tolerated treatment well and was appropriately fatigued with treatment.  Pt will benefit from cont skilled PT per protocol to address ongoing goals and improve pain, ROM, strength, gait, and functional mobility.       OBJECTIVE IMPAIRMENTS: Abnormal gait, decreased activity tolerance, decreased endurance, decreased knowledge of use of DME, decreased mobility, difficulty walking, decreased ROM, decreased strength, hypomobility, increased edema, impaired flexibility, and pain.    ACTIVITY LIMITATIONS: bending, sitting, standing, squatting, stairs, transfers, bed mobility, dressing, and  locomotion level   PARTICIPATION LIMITATIONS: meal prep, cleaning, laundry, driving, shopping, and community activity   PERSONAL FACTORS: 1-2 comorbidity: Arthritis and L TKR are also affecting patient's functional outcome.   REHAB POTENTIAL: Good  CLINICAL DECISION MAKING: Stable/uncomplicated  EVALUATION COMPLEXITY: Low   GOALS:   SHORT TERM GOALS:  Pt will be independent and compliant with HEP for improved pain, ROM, strength, and function. Baseline: Goal status:  GOAL MET  1/7 Target date:  04/08/2024  2.  Pt will demo a good quad set and be able to perform a supine SLR independently for improved strength and stability with gait.  Baseline:  Goal status: GOAL MET   Target date:  04/01/2021  3.  Pt will demo improved L knee  AROM to 0-95 deg for improved gait, stiffness, and mobility.  Baseline:  Goal status: GOAL MET   Target date:  04/15/2024  4.  Pt will demo improved quality of gait and ambulate with a SPC with good stability.  Baseline:  Goal status: PROGRESSING  1/7 Target date:  04/15/2024  5.   Pt will be able to perform her normal transfers with no > than minimal difficulty.   Baseline:  Goal status: ONGOING Target date:  04/29/2024    LONG TERM GOALS: Target date: 05/27/2024   Pt will demo improved R knee AROM to 0 - 120 deg for improved mobility and stiffness and for improved performance of stairs.  Baseline:  Goal status: GOAL MET  1/7  2.  Pt will ambulate with a normalized heel to toe gait without limping.  Baseline:  Goal status:PROGRESSING  3.  Pt will ambulate community distance without an AD without significant knee pain and difficulty.  Baseline:  Goal status: INITIAL  4.  Pt will be able to perform household chores without significant knee pain and difficulty.  Baseline:  Goal status: INITIAL  5.   Pt will demo improved strength to 4+/5 in R hip abd, 5/5 in Hip flexion, and 5/5 in knee ext and flexion for improved performance of daily  functional mobility and to assist with returning to recreational activities.  Baseline:  Goal status: ONGOING  6.  Pt will be able to perform 4-5 steps with a reciprocal gait with the rail for improved performance of stairs.  Baseline:  Goal status: INITIAL   PLAN:  PT FREQUENCY: 2-3x/wk x 1-2 weeks and 2x/wk afterwards   PT DURATION: 6-7 weeks   PLANNED INTERVENTIONS: 97164- PT Re-evaluation, 97750- Physical Performance Testing, 97110-Therapeutic exercises, 97530- Therapeutic activity, 97112- Neuromuscular re-education, 97535- Self Care, 02859- Manual therapy, [phone]- Gait training, [phone]- Aquatic Therapy, [phone]- Electrical stimulation (unattended), Patient/Family education, Balance training, Stair training, Taping, Joint mobilization, Spinal mobilization, Scar mobilization, DME instructions, Cryotherapy, and Moist heat  PLAN FOR NEXT SESSION: Cont per TKR protocol.  Cont with gait training and ice.     Leigh Minerva III PT, DPT 04/20/2024 8:28 PM              "

## 2024-04-19 ENCOUNTER — Ambulatory Visit (HOSPITAL_BASED_OUTPATIENT_CLINIC_OR_DEPARTMENT_OTHER): Admitting: Physical Therapy

## 2024-04-19 DIAGNOSIS — M25561 Pain in right knee: Secondary | ICD-10-CM

## 2024-04-19 DIAGNOSIS — M6281 Muscle weakness (generalized): Secondary | ICD-10-CM

## 2024-04-19 DIAGNOSIS — M25661 Stiffness of right knee, not elsewhere classified: Secondary | ICD-10-CM

## 2024-04-19 DIAGNOSIS — R262 Difficulty in walking, not elsewhere classified: Secondary | ICD-10-CM

## 2024-04-20 ENCOUNTER — Encounter (HOSPITAL_BASED_OUTPATIENT_CLINIC_OR_DEPARTMENT_OTHER): Payer: Self-pay | Admitting: Physical Therapy

## 2024-04-22 ENCOUNTER — Ambulatory Visit (HOSPITAL_BASED_OUTPATIENT_CLINIC_OR_DEPARTMENT_OTHER): Admitting: Physical Therapy

## 2024-04-22 ENCOUNTER — Encounter (HOSPITAL_BASED_OUTPATIENT_CLINIC_OR_DEPARTMENT_OTHER): Payer: Self-pay | Admitting: Physical Therapy

## 2024-04-22 DIAGNOSIS — M25561 Pain in right knee: Secondary | ICD-10-CM | POA: Diagnosis not present

## 2024-04-22 DIAGNOSIS — M25661 Stiffness of right knee, not elsewhere classified: Secondary | ICD-10-CM

## 2024-04-22 DIAGNOSIS — R262 Difficulty in walking, not elsewhere classified: Secondary | ICD-10-CM

## 2024-04-22 DIAGNOSIS — M6281 Muscle weakness (generalized): Secondary | ICD-10-CM

## 2024-04-22 NOTE — Therapy (Signed)
 " OUTPATIENT PHYSICAL THERAPY LOWER EXTREMITY TREATMENT     Patient Name: Laura Mills MRN: [id number] DOB:10-02-1960, 64 y.o., female Today's Date: 04/23/2024  END OF SESSION:  PT End of Session - 04/22/24 1202     Visit Number 13    Number of Visits 22    Date for Recertification  05/27/24    Authorization Type AETNA    PT Start Time 1152    PT Stop Time 1237    PT Time Calculation (min) 45 min    Activity Tolerance Patient tolerated treatment well    Behavior During Therapy [facility] for tasks assessed/performed                  Past Medical History:  Diagnosis Date   Allergy    Arthritis    LUMBAR SPINE AND RIGHT HIP   Cataract    GERD (gastroesophageal reflux disease)    History of kidney stones 10/2012   Hyperlipidemia    Hypertension    Renal stone    Past Surgical History:  Procedure Laterality Date   ABDOMINAL HYSTERECTOMY  2000   CHOLECYSTECTOMY  04/09/2007   CYSTOSCOPY WITH URETEROSCOPY Right 10/02/2012   Procedure: CYSTOSCOPY WITH RIGHT RETROGRADE AND STENT PLACEMENT ;  Surgeon: Donnice Gwenyth Brooks, MD;  Location: [facility];  Service: Urology;  Laterality: Right;   CYSTOSCOPY WITH URETEROSCOPY Right 11/13/2012   Procedure: RIGHT URETEROSCOPY;  Surgeon: Donnice Gwenyth Brooks, MD;  Location: [facility];  Service: Urology;  Laterality: Right;  with Stent   CYSTOSCOPY/URETEROSCOPY/HOLMIUM LASER/STENT PLACEMENT Right 05/31/2022   Procedure: CYSTOSCOPY RIGHT URETEROSCOPY/HOLMIUM LASER/STENT PLACEMENT;  Surgeon: Brooks Donnice, MD;  Location: [facility];  Service: Urology;  Laterality: Right;  60 MINS   LITHOTRIPSY  10/06/2012   PUBOVAGINAL SLING N/A 04/15/2017   Procedure: CARLOYN GLADE;  Surgeon: Brooks Donnice, MD;  Location: [facility];  Service: Urology;  Laterality: N/A;  NEEDS 90 MIN FOR PROCEDURE   ROTATOR CUFF REPAIR Left    TYMPANOSTOMY TUBE PLACEMENT Right 2019   to clear up MRSA   There are no active problems to display for this  patient.   REFERRING PROVIDER: Beverley Evalene BIRCH, MD   REFERRING DIAG: presence of R artificial knee joint  L TKR   THERAPY DIAG:  Pain in both knees, unspecified chronicity  Stiffness of right knee, not elsewhere classified  Muscle weakness (generalized)  Difficulty in walking, not elsewhere classified  Rationale for Evaluation and Treatment: Rehabilitation  ONSET DATE: DOS  03/11/24  SUBJECTIVE:   SUBJECTIVE STATEMENT: Pt is 5 weeks and 4 days s/p R TKR.  Pt denies any adverse effects after prior treatment.  Pt is performing her HEP.     Pt saw MD yesterday and he released her from her care.  Pt states he informed her she can use scar cream and massage scar and also get in the pool and hot tub.  Pt states she doesn't have any restrictions.  Pt performed her exercises in the jacuzzi last night and felt fine afterwards.  She states she didn't need ice after the workout.     PERTINENT HISTORY: R TKR 03/11/24 L TKR  12/04/2023 Arthritis in lumbar and R hip also HTN  PAIN:  Anterior R knee, incision  2/10 current       PRECAUTIONS: Other: bilat TKR    WEIGHT BEARING RESTRICTIONS: none indicated  FALLS:  Has patient fallen in last 6 months? Yes. Number of falls 4: R ankle rolled with new shoes ;  fell in the bathroom when the towel bar broke off the wall   on wet leaves, walking up a ramp  LIVING ENVIRONMENT: Lives with: lives with their spouse Lives in: 1 story home Stairs: 3 steps to enter home with 1 rail Has following equipment at home: FWW, crutches, SPC, 3 pronged cane  OCCUPATION: part time home care, primarily sitting   PLOF: Independent  PATIENT GOALS: to improve pain and mobility   OBJECTIVE:  Note: Objective measures were completed at Evaluation unless otherwise noted.  DIAGNOSTIC FINDINGS: Pt is post op.   PATIENT SURVEYS:  LEFS:  10/80  (Eval) LEFS:   32/80  (04/14/24)   COGNITION: Overall cognitive status: Within functional limits for  tasks assessed     OBSERVATION: Pt has an aquacell bandage over incision.  Aquacell has no drainage just a tiny spot on prox to mid drainage.  Pt has no band aids today and the 2 spots on R LE distal to bandage are healing well.  MD is aware of those spots.  Bruising has improved.    LOWER EXTREMITY ROM:   ROM Right eval Left eval Right 12/18 Right 12/26 Right 12/29 Right 1/2 After PROM Right 1/6 Before PROM  Hip flexion         Hip extension         Hip abduction         Hip adduction         Hip internal rotation         Hip external rotation         Knee flexion 81 PROM; 77 AROM  94 AROM 107 AROM  123 122  Knee extension 4 AROM  1 AROM 1 AROM ; 0 deg after stretching 1 AROM ; 0 deg after stretching 0 0  Ankle dorsiflexion         Ankle plantarflexion         Ankle inversion         Ankle eversion          (Blank rows = not tested)    LOWER EXTREMITY MMT:   Strength not tested due to recent surgery and post op considerations.   FUNCTIONAL TESTS:  Pt required assistance with lifting LE with bed mobility.   GAIT: Comments: pt ambulating with FWW with a heel to toe gait with limited toe off and increased UE Wb'ing through walker.                                                                                                                                TREATMENT:   recumbent bike lvl 0-1 x 5 mins Pt ambulated 900 ft with SPC Pt received R knee flexion and extension PROM in supine. Supine SLR 1# 2x10 reps LAQ 2# x10, 2.5# 2x12 Seated HS curl with YTB x10, RTB 2x10 Step ups 6 inch step 2x10 with UE support on rail  PATIENT EDUCATION:  Education details:  Educated pt in post op and protocol expectations.  HEP, dx, prognosis, exercise form, and POC.  PT answered pt's questions.  Person educated: Patient Education method: Explanation, Demonstration, Tactile cues, Verbal cues, and Handouts Education comprehension: verbalized understanding, returned  demonstration, verbal cues required, and tactile cues required  HOME EXERCISE PROGRAM: Exercises - Supine Quadricep Sets  - 2 x daily - 7 x weekly - 2 sets - 10 reps - 5 seconds hold - Supine Gluteal Sets  - 2 x daily - 7 x weekly - 2 sets - 10 reps - 5 seconds hold - Supine Ankle Pumps  - 7 x weekly - Longsitting Heel Slide  - 2-3 x daily - 7 x weekly - 1 sets - 10 reps -Sitting with knee flexed for 3-5 mins comfortably at various times for 6-7 times/day   Access Code: [email or web address] URL: [email or web address] Date: 03/23/2024 Prepared by: Mose Minerva  Exercises - Supine Short Arc Quad  - 1 x daily - 7 x weekly - 2 sets - 10 reps - Small Range Straight Leg Raise  - 2 x daily - 7 x weekly - 1 sets - 7-10 reps - Long Sitting Calf Stretch with Strap  - 2 x daily - 7 x weekly - 2-3 reps - 20 seconds hold - Supine Heel Slide with Strap  - 2 x daily - 7 x weekly - 2 sets - 10 reps - Heel Raises with Counter Support  - 1 x daily - 7 x weekly - 2 sets - 10 reps - Standing Terminal Knee Extension with Resistance  - 1 x daily - 5-6 x weekly - 2 sets - 10 reps  ASSESSMENT:  CLINICAL IMPRESSION: Pt presents to clinic with SPC.  Pt ambulated 3 laps in the clinic with PT with SPC and did well.  She had good stability with SPC.  Pt saw MD and received a good report.  She has been released by MD and also released to get in the pool.  Pt performed exercises in the hot tub last night without any adverse effects.  Pt continues to have excellent knee ROM and is progressing with quad strength.  Pt has improved performance and strength with step ups.  PT increased step height today to 6 inches with fwd step ups and pt tolerated it well.  Pt responded well to treatment reporting no increased pain after treatment but was tired.  Pt will benefit from cont skilled PT per protocol to address ongoing goals and improve pain, ROM, strength, gait, and functional mobility.      OBJECTIVE IMPAIRMENTS:  Abnormal gait, decreased activity tolerance, decreased endurance, decreased knowledge of use of DME, decreased mobility, difficulty walking, decreased ROM, decreased strength, hypomobility, increased edema, impaired flexibility, and pain.    ACTIVITY LIMITATIONS: bending, sitting, standing, squatting, stairs, transfers, bed mobility, dressing, and locomotion level   PARTICIPATION LIMITATIONS: meal prep, cleaning, laundry, driving, shopping, and community activity   PERSONAL FACTORS: 1-2 comorbidity: Arthritis and L TKR are also affecting patient's functional outcome.   REHAB POTENTIAL: Good  CLINICAL DECISION MAKING: Stable/uncomplicated  EVALUATION COMPLEXITY: Low   GOALS:   SHORT TERM GOALS:  Pt will be independent and compliant with HEP for improved pain, ROM, strength, and function. Baseline: Goal status:  GOAL MET  1/7 Target date:  04/08/2024  2.  Pt will demo a good quad set and be able to perform a supine SLR independently for improved strength and stability with gait.  Baseline:  Goal status: GOAL MET   Target date:  04/01/2021  3.  Pt will demo improved L knee  AROM to 0-95 deg for improved gait, stiffness, and mobility.  Baseline:  Goal status: GOAL MET   Target date:  04/15/2024  4.  Pt will demo improved quality of gait and ambulate with a SPC with good stability.  Baseline:  Goal status: PROGRESSING  1/7 Target date:  04/15/2024  5.   Pt will be able to perform her normal transfers with no > than minimal difficulty.   Baseline:  Goal status: ONGOING Target date:  04/29/2024    LONG TERM GOALS: Target date: 05/27/2024   Pt will demo improved R knee AROM to 0 - 120 deg for improved mobility and stiffness and for improved performance of stairs.  Baseline:  Goal status: GOAL MET  1/7  2.  Pt will ambulate with a normalized heel to toe gait without limping.  Baseline:  Goal status:PROGRESSING  3.  Pt will ambulate community distance without an AD without  significant knee pain and difficulty.  Baseline:  Goal status: INITIAL  4.  Pt will be able to perform household chores without significant knee pain and difficulty.  Baseline:  Goal status: INITIAL  5.   Pt will demo improved strength to 4+/5 in R hip abd, 5/5 in Hip flexion, and 5/5 in knee ext and flexion for improved performance of daily functional mobility and to assist with returning to recreational activities.  Baseline:  Goal status: ONGOING  6.  Pt will be able to perform 4-5 steps with a reciprocal gait with the rail for improved performance of stairs.  Baseline:  Goal status: INITIAL   PLAN:  PT FREQUENCY: 2-3x/wk x 1-2 weeks and 2x/wk afterwards   PT DURATION: 6-7 weeks   PLANNED INTERVENTIONS: 97164- PT Re-evaluation, 97750- Physical Performance Testing, 97110-Therapeutic exercises, 97530- Therapeutic activity, 97112- Neuromuscular re-education, 97535- Self Care, 02859- Manual therapy, [phone]- Gait training, [phone]- Aquatic Therapy, [phone]- Electrical stimulation (unattended), Patient/Family education, Balance training, Stair training, Taping, Joint mobilization, Spinal mobilization, Scar mobilization, DME instructions, Cryotherapy, and Moist heat  PLAN FOR NEXT SESSION: Cont per TKR protocol.  Cont with gait training and ice.     Leigh Minerva III PT, DPT 04/23/24 12:10 PM              "

## 2024-04-26 ENCOUNTER — Ambulatory Visit (HOSPITAL_BASED_OUTPATIENT_CLINIC_OR_DEPARTMENT_OTHER): Payer: Self-pay | Admitting: Physical Therapy

## 2024-04-26 ENCOUNTER — Encounter (HOSPITAL_BASED_OUTPATIENT_CLINIC_OR_DEPARTMENT_OTHER): Payer: Self-pay | Admitting: Physical Therapy

## 2024-04-26 DIAGNOSIS — M25561 Pain in right knee: Secondary | ICD-10-CM

## 2024-04-26 DIAGNOSIS — R262 Difficulty in walking, not elsewhere classified: Secondary | ICD-10-CM

## 2024-04-26 DIAGNOSIS — M25661 Stiffness of right knee, not elsewhere classified: Secondary | ICD-10-CM

## 2024-04-26 DIAGNOSIS — M6281 Muscle weakness (generalized): Secondary | ICD-10-CM

## 2024-04-26 NOTE — Therapy (Signed)
 " OUTPATIENT PHYSICAL THERAPY LOWER EXTREMITY TREATMENT     Patient Name: Laura Mills MRN: [id number] DOB:09-02-1960, 64 y.o., female Today's Date: 04/27/2024  END OF SESSION:  PT End of Session - 04/26/24 1215     Visit Number 14    Number of Visits 22    Date for Recertification  05/27/24    Authorization Type AETNA    PT Start Time 1152    PT Stop Time 1234    PT Time Calculation (min) 42 min    Activity Tolerance Patient tolerated treatment well    Behavior During Therapy [facility] for tasks assessed/performed                   Past Medical History:  Diagnosis Date   Allergy    Arthritis    LUMBAR SPINE AND RIGHT HIP   Cataract    GERD (gastroesophageal reflux disease)    History of kidney stones 10/2012   Hyperlipidemia    Hypertension    Renal stone    Past Surgical History:  Procedure Laterality Date   ABDOMINAL HYSTERECTOMY  2000   CHOLECYSTECTOMY  04/09/2007   CYSTOSCOPY WITH URETEROSCOPY Right 10/02/2012   Procedure: CYSTOSCOPY WITH RIGHT RETROGRADE AND STENT PLACEMENT ;  Surgeon: Donnice Gwenyth Brooks, MD;  Location: [facility];  Service: Urology;  Laterality: Right;   CYSTOSCOPY WITH URETEROSCOPY Right 11/13/2012   Procedure: RIGHT URETEROSCOPY;  Surgeon: Donnice Gwenyth Brooks, MD;  Location: [facility];  Service: Urology;  Laterality: Right;  with Stent   CYSTOSCOPY/URETEROSCOPY/HOLMIUM LASER/STENT PLACEMENT Right 05/31/2022   Procedure: CYSTOSCOPY RIGHT URETEROSCOPY/HOLMIUM LASER/STENT PLACEMENT;  Surgeon: Brooks Donnice, MD;  Location: [facility];  Service: Urology;  Laterality: Right;  60 MINS   LITHOTRIPSY  10/06/2012   PUBOVAGINAL SLING N/A 04/15/2017   Procedure: CARLOYN GLADE;  Surgeon: Brooks Donnice, MD;  Location: [facility];  Service: Urology;  Laterality: N/A;  NEEDS 90 MIN FOR PROCEDURE   ROTATOR CUFF REPAIR Left    TYMPANOSTOMY TUBE PLACEMENT Right 2019   to clear up MRSA   There are no active problems to display for this  patient.   REFERRING PROVIDER: Beverley Evalene BIRCH, MD   REFERRING DIAG: presence of R artificial knee joint  L TKR   THERAPY DIAG:  Pain in both knees, unspecified chronicity  Stiffness of right knee, not elsewhere classified  Muscle weakness (generalized)  Difficulty in walking, not elsewhere classified  Rationale for Evaluation and Treatment: Rehabilitation  ONSET DATE: DOS  03/11/24  SUBJECTIVE:   SUBJECTIVE STATEMENT: Pt is 6 weeks and 4 days s/p R TKR.  Pt denies any adverse effects after prior treatment.  Pt is performing her HEP.  Pt states her kneecaps are sore today.  Pt saw MD yesterday and he released her from her care.  Pt states he informed her she can use scar cream and massage scar and also get in the pool and hot tub.  Pt states she doesn't have any restrictions.  Pt performed her exercises in the jacuzzi last night and felt fine afterwards.  She states she didn't need ice after the workout.     PERTINENT HISTORY: R TKR 03/11/24 L TKR  12/04/2023 Arthritis in lumbar and R hip also HTN  PAIN:  bilat knees 1/10 current       PRECAUTIONS: Other: bilat TKR    WEIGHT BEARING RESTRICTIONS: none indicated  FALLS:  Has patient fallen in last 6 months? Yes. Number of falls 4: R ankle rolled with  new shoes ;  fell in the bathroom when the towel bar broke off the wall   on wet leaves, walking up a ramp  LIVING ENVIRONMENT: Lives with: lives with their spouse Lives in: 1 story home Stairs: 3 steps to enter home with 1 rail Has following equipment at home: FWW, crutches, SPC, 3 pronged cane  OCCUPATION: part time home care, primarily sitting   PLOF: Independent  PATIENT GOALS: to improve pain and mobility   OBJECTIVE:  Note: Objective measures were completed at Evaluation unless otherwise noted.  DIAGNOSTIC FINDINGS: Pt is post op.   PATIENT SURVEYS:  LEFS:  10/80  (Eval) LEFS:   32/80  (04/14/24)     LOWER EXTREMITY ROM:   ROM Right eval  Left eval Right 12/18 Right 12/26 Right 12/29 Right 1/2 After PROM Right 1/6 Before PROM  Hip flexion         Hip extension         Hip abduction         Hip adduction         Hip internal rotation         Hip external rotation         Knee flexion 81 PROM; 77 AROM  94 AROM 107 AROM  123 122  Knee extension 4 AROM  1 AROM 1 AROM ; 0 deg after stretching 1 AROM ; 0 deg after stretching 0 0  Ankle dorsiflexion         Ankle plantarflexion         Ankle inversion         Ankle eversion          (Blank rows = not tested)    LOWER EXTREMITY MMT:   Strength not tested due to recent surgery and post op considerations.   FUNCTIONAL TESTS:  Pt required assistance with lifting LE with bed mobility.   GAIT: Comments: pt ambulating with FWW with a heel to toe gait with limited toe off and increased UE Wb'ing through walker.                                                                                                                                TREATMENT:   recumbent bike lvl 0-1 x 5 mins Supine SLR 1.5# 2x10 reps Pt received R knee flexion and extension PROM in supine. LAQ 2.5# x12, 3#  2x10 Seated HS curl with RTB 3x10 TKE with RTB 2x10 Standing hip abd x 10 bilat with RTB above knees   Mini squats 2x10 Step ups 6 inch step 2x10 with UE support on rail Lateral step ups 4 inch 2x10     PATIENT EDUCATION:  Education details:  Educated pt in post op and protocol expectations.  HEP, dx, prognosis, exercise form, and POC.  PT answered pt's questions.  Person educated: Patient Education method: Explanation, Demonstration, Tactile cues, Verbal cues, and Handouts Education comprehension: verbalized understanding, returned  demonstration, verbal cues required, and tactile cues required  HOME EXERCISE PROGRAM: Exercises - Supine Quadricep Sets  - 2 x daily - 7 x weekly - 2 sets - 10 reps - 5 seconds hold - Supine Gluteal Sets  - 2 x daily - 7 x weekly - 2 sets - 10 reps -  5 seconds hold - Supine Ankle Pumps  - 7 x weekly - Longsitting Heel Slide  - 2-3 x daily - 7 x weekly - 1 sets - 10 reps -Sitting with knee flexed for 3-5 mins comfortably at various times for 6-7 times/day   Access Code: [email or web address] URL: [email or web address] Date: 03/23/2024 Prepared by: Mose Minerva  Exercises - Supine Short Arc Quad  - 1 x daily - 7 x weekly - 2 sets - 10 reps - Small Range Straight Leg Raise  - 2 x daily - 7 x weekly - 1 sets - 7-10 reps - Long Sitting Calf Stretch with Strap  - 2 x daily - 7 x weekly - 2-3 reps - 20 seconds hold - Supine Heel Slide with Strap  - 2 x daily - 7 x weekly - 2 sets - 10 reps - Heel Raises with Counter Support  - 1 x daily - 7 x weekly - 2 sets - 10 reps - Standing Terminal Knee Extension with Resistance  - 1 x daily - 5-6 x weekly - 2 sets - 10 reps  ASSESSMENT:  CLINICAL IMPRESSION: Pt presents to clinic with SPC.  Pt ambulated 3 laps in the clinic with PT with SPC and did well.  She had good stability with SPC.  Pt saw MD and received a good report.  She has been released by MD and also released to get in the pool.  Pt performed exercises in the hot tub last night without any adverse effects.  Pt continues to have excellent knee ROM and is progressing with quad strength.  Pt has improved performance and strength with step ups.  PT increased step height today to 6 inches with fwd step ups and pt tolerated it well.  Pt responded well to treatment reporting no increased pain after treatment but was tired.  Pt will benefit from cont skilled PT per protocol to address ongoing goals and improve pain, ROM, strength, gait, and functional mobility.      OBJECTIVE IMPAIRMENTS: Abnormal gait, decreased activity tolerance, decreased endurance, decreased knowledge of use of DME, decreased mobility, difficulty walking, decreased ROM, decreased strength, hypomobility, increased edema, impaired flexibility, and pain.    ACTIVITY  LIMITATIONS: bending, sitting, standing, squatting, stairs, transfers, bed mobility, dressing, and locomotion level   PARTICIPATION LIMITATIONS: meal prep, cleaning, laundry, driving, shopping, and community activity   PERSONAL FACTORS: 1-2 comorbidity: Arthritis and L TKR are also affecting patient's functional outcome.   REHAB POTENTIAL: Good  CLINICAL DECISION MAKING: Stable/uncomplicated  EVALUATION COMPLEXITY: Low   GOALS:   SHORT TERM GOALS:  Pt will be independent and compliant with HEP for improved pain, ROM, strength, and function. Baseline: Goal status:  GOAL MET  1/7 Target date:  04/08/2024  2.  Pt will demo a good quad set and be able to perform a supine SLR independently for improved strength and stability with gait.  Baseline:  Goal status: GOAL MET   Target date:  04/01/2021  3.  Pt will demo improved L knee  AROM to 0-95 deg for improved gait, stiffness, and mobility.  Baseline:  Goal status: GOAL MET   Target date:  04/15/2024  4.  Pt will demo improved quality of gait and ambulate with a SPC with good stability.  Baseline:  Goal status: PROGRESSING  1/7 Target date:  04/15/2024  5.   Pt will be able to perform her normal transfers with no > than minimal difficulty.   Baseline:  Goal status: ONGOING Target date:  04/29/2024    LONG TERM GOALS: Target date: 05/27/2024   Pt will demo improved R knee AROM to 0 - 120 deg for improved mobility and stiffness and for improved performance of stairs.  Baseline:  Goal status: GOAL MET  1/7  2.  Pt will ambulate with a normalized heel to toe gait without limping.  Baseline:  Goal status:PROGRESSING  3.  Pt will ambulate community distance without an AD without significant knee pain and difficulty.  Baseline:  Goal status: INITIAL  4.  Pt will be able to perform household chores without significant knee pain and difficulty.  Baseline:  Goal status: INITIAL  5.   Pt will demo improved strength to 4+/5 in R  hip abd, 5/5 in Hip flexion, and 5/5 in knee ext and flexion for improved performance of daily functional mobility and to assist with returning to recreational activities.  Baseline:  Goal status: ONGOING  6.  Pt will be able to perform 4-5 steps with a reciprocal gait with the rail for improved performance of stairs.  Baseline:  Goal status: INITIAL   PLAN:  PT FREQUENCY: 2-3x/wk x 1-2 weeks and 2x/wk afterwards   PT DURATION: 6-7 weeks   PLANNED INTERVENTIONS: 97164- PT Re-evaluation, 97750- Physical Performance Testing, 97110-Therapeutic exercises, 97530- Therapeutic activity, 97112- Neuromuscular re-education, 97535- Self Care, 02859- Manual therapy, [phone]- Gait training, [phone]- Aquatic Therapy, [phone]- Electrical stimulation (unattended), Patient/Family education, Balance training, Stair training, Taping, Joint mobilization, Spinal mobilization, Scar mobilization, DME instructions, Cryotherapy, and Moist heat  PLAN FOR NEXT SESSION: Cont per TKR protocol.  Cont with gait training and ice.     Leigh Minerva III PT, DPT 04/27/24 2:43 PM              "

## 2024-05-05 ENCOUNTER — Encounter (HOSPITAL_BASED_OUTPATIENT_CLINIC_OR_DEPARTMENT_OTHER): Payer: Self-pay | Admitting: Physical Therapy

## 2024-05-05 ENCOUNTER — Ambulatory Visit (HOSPITAL_BASED_OUTPATIENT_CLINIC_OR_DEPARTMENT_OTHER): Payer: Self-pay | Admitting: Physical Therapy

## 2024-05-05 DIAGNOSIS — M25561 Pain in right knee: Secondary | ICD-10-CM | POA: Diagnosis not present

## 2024-05-05 DIAGNOSIS — R262 Difficulty in walking, not elsewhere classified: Secondary | ICD-10-CM

## 2024-05-05 DIAGNOSIS — M6281 Muscle weakness (generalized): Secondary | ICD-10-CM

## 2024-05-05 DIAGNOSIS — M25661 Stiffness of right knee, not elsewhere classified: Secondary | ICD-10-CM

## 2024-05-05 NOTE — Therapy (Signed)
 " OUTPATIENT PHYSICAL THERAPY LOWER EXTREMITY TREATMENT     Patient Name: Laura Mills MRN: [id number] DOB:1960/11/21, 64 y.o., female Today's Date: 05/05/2024  END OF SESSION:  PT End of Session - 05/05/24 0851     Visit Number 15    Number of Visits 22    Date for Recertification  05/27/24    Authorization Type AETNA    PT Start Time 0805    PT Stop Time 0857    PT Time Calculation (min) 52 min    Activity Tolerance Patient tolerated treatment well    Behavior During Therapy [facility] for tasks assessed/performed                    Past Medical History:  Diagnosis Date   Allergy    Arthritis    LUMBAR SPINE AND RIGHT HIP   Cataract    GERD (gastroesophageal reflux disease)    History of kidney stones 10/2012   Hyperlipidemia    Hypertension    Renal stone    Past Surgical History:  Procedure Laterality Date   ABDOMINAL HYSTERECTOMY  2000   CHOLECYSTECTOMY  04/09/2007   CYSTOSCOPY WITH URETEROSCOPY Right 10/02/2012   Procedure: CYSTOSCOPY WITH RIGHT RETROGRADE AND STENT PLACEMENT ;  Surgeon: Donnice Gwenyth Brooks, MD;  Location: [facility];  Service: Urology;  Laterality: Right;   CYSTOSCOPY WITH URETEROSCOPY Right 11/13/2012   Procedure: RIGHT URETEROSCOPY;  Surgeon: Donnice Gwenyth Brooks, MD;  Location: [facility];  Service: Urology;  Laterality: Right;  with Stent   CYSTOSCOPY/URETEROSCOPY/HOLMIUM LASER/STENT PLACEMENT Right 05/31/2022   Procedure: CYSTOSCOPY RIGHT URETEROSCOPY/HOLMIUM LASER/STENT PLACEMENT;  Surgeon: Brooks Donnice, MD;  Location: [facility];  Service: Urology;  Laterality: Right;  60 MINS   LITHOTRIPSY  10/06/2012   PUBOVAGINAL SLING N/A 04/15/2017   Procedure: CARLOYN GLADE;  Surgeon: Brooks Donnice, MD;  Location: [facility];  Service: Urology;  Laterality: N/A;  NEEDS 90 MIN FOR PROCEDURE   ROTATOR CUFF REPAIR Left    TYMPANOSTOMY TUBE PLACEMENT Right 2019   to clear up MRSA   There are no active problems to display for this  patient.   REFERRING PROVIDER: Beverley Evalene BIRCH, MD   REFERRING DIAG: presence of R artificial knee joint  L TKR   THERAPY DIAG:  Pain in both knees, unspecified chronicity  Muscle weakness (generalized)  Difficulty in walking, not elsewhere classified  Stiffness of right knee, not elsewhere classified  Rationale for Evaluation and Treatment: Rehabilitation  ONSET DATE: DOS  03/11/24  SUBJECTIVE:   SUBJECTIVE STATEMENT: Pt is 7 weeks and 6 days s/p R TKR.  Pt denies any adverse effects after prior treatment.  Pt is performing her HEP.  Pt states she was able to go up/down her basement steps with her husband guarding her.  Pt performed 2 miles on the octane x ride and performed exercises in the hot tub yesterday.  She states she felt fine after the workout.   PERTINENT HISTORY: R TKR 03/11/24 L TKR  12/04/2023 Arthritis in lumbar and R hip also HTN  PAIN:  anterior knees at patella bilat 1-2/10 current       PRECAUTIONS: Other: bilat TKR    WEIGHT BEARING RESTRICTIONS: none indicated  FALLS:  Has patient fallen in last 6 months? Yes. Number of falls 4: R ankle rolled with new shoes ;  fell in the bathroom when the towel bar broke off the wall   on wet leaves, walking up a ramp  LIVING ENVIRONMENT: Lives  with: lives with their spouse Lives in: 1 story home Stairs: 3 steps to enter home with 1 rail Has following equipment at home: FWW, crutches, SPC, 3 pronged cane  OCCUPATION: part time home care, primarily sitting   PLOF: Independent  PATIENT GOALS: to improve pain and mobility   OBJECTIVE:  Note: Objective measures were completed at Evaluation unless otherwise noted.  DIAGNOSTIC FINDINGS: Pt is post op.   PATIENT SURVEYS:  LEFS:  10/80  (Eval) LEFS:   32/80  (04/14/24)     LOWER EXTREMITY ROM:   ROM Right eval Left eval Right 12/18 Right 12/26 Right 12/29 Right 1/2 After PROM Right 1/6 Before PROM  Hip flexion         Hip extension          Hip abduction         Hip adduction         Hip internal rotation         Hip external rotation         Knee flexion 81 PROM; 77 AROM  94 AROM 107 AROM  123 122  Knee extension 4 AROM  1 AROM 1 AROM ; 0 deg after stretching 1 AROM ; 0 deg after stretching 0 0  Ankle dorsiflexion         Ankle plantarflexion         Ankle inversion         Ankle eversion          (Blank rows = not tested)    LOWER EXTREMITY MMT:   Strength not tested due to recent surgery and post op considerations.   FUNCTIONAL TESTS:  Pt required assistance with lifting LE with bed mobility.   GAIT: Comments: pt ambulating with FWW with a heel to toe gait with limited toe off and increased UE Wb'ing through walker.                                                                                                                                TREATMENT:   recumbent bike lvl 2-3 x 5 mins  Gait:  Pt ambulated 370 ft with SPC.  Pt ambulated without cane in the hallway with SBA/CGA and occasional UE support on the wall.  PT provided cuing to decrease speed, improve control, and improve heel to toe gait pattern.   Pt received R knee flexion and extension PROM in supine.  Supine SLR 1.5# 2x10 reps LAQ 3#  3x10 Seated HS curl with RTB 3x10 Standing hip abd 2x10 R LE, x 10 L LE with RTB above knees  Sidestepping with UE support on rail Step ups x10 reps each on 6 and 8 inch step with UE support on rail Lateral step ups 6 inch 2x10  PT updated HEP and gave pt a HEP handout.  PT educated pt in correct form and appropriate frequency.      PATIENT EDUCATION:  Education  details:  Educated pt in post op and protocol expectations.  HEP, dx, prognosis, exercise form, and POC.  PT answered pt's questions.  Person educated: Patient Education method: Explanation, Demonstration, Tactile cues, Verbal cues, and Handouts Education comprehension: verbalized understanding, returned demonstration, verbal cues required, and  tactile cues required  HOME EXERCISE PROGRAM: Exercises - Supine Quadricep Sets  - 2 x daily - 7 x weekly - 2 sets - 10 reps - 5 seconds hold - Supine Gluteal Sets  - 2 x daily - 7 x weekly - 2 sets - 10 reps - 5 seconds hold - Supine Ankle Pumps  - 7 x weekly - Longsitting Heel Slide  - 2-3 x daily - 7 x weekly - 1 sets - 10 reps -Sitting with knee flexed for 3-5 mins comfortably at various times for 6-7 times/day   Access Code: [email or web address] URL: [email or web address] Date: 03/23/2024 Prepared by: Mose Minerva  Exercises - Supine Short Arc Quad  - 1 x daily - 7 x weekly - 2 sets - 10 reps - Small Range Straight Leg Raise  - 2 x daily - 7 x weekly - 1 sets - 7-10 reps - Long Sitting Calf Stretch with Strap  - 2 x daily - 7 x weekly - 2-3 reps - 20 seconds hold - Supine Heel Slide with Strap  - 2 x daily - 7 x weekly - 2 sets - 10 reps - Heel Raises with Counter Support  - 1 x daily - 7 x weekly - 2 sets - 10 reps - Standing Terminal Knee Extension with Resistance  - 1 x daily - 5-6 x weekly - 2 sets - 10 reps  Updated HEP: - Side Stepping with Counter Support  - 1 x daily - 7 x weekly - 3 sets - Standing Hip Abduction with Counter Support  - 1 x daily - 7 x weekly - 2 sets - 10 reps  ASSESSMENT:  CLINICAL IMPRESSION: PT worked on gait with and without cane.  Pt ambulated without cane in the hallway with SBA/CGA.  Pt had occasions of unsteadiness requiring the wall for balance and support and CGA from PT.  She had and increased limp without cane and required cuing to decrease speed and improve control and for heel to toe gait.  Pt required cuing to tighten quad and maintain knee straight with supine SLR.  Pt is improving with LE strength as evidenced by performance of step activities.  PT increased height of fwd and lateral step ups and she performed them well.  Pt also reports she was able to perform her basement steps at home with her husband guarding her.  Pt continues to  have excellent knee ROM.  PT updated HEP and gave pt a HEP handout.  Pt demonstrates good understanding of HEP.  She tolerated treatment well reporting no increased pain after treatment though was fatigued.  She will benefit from cont skilled PT per protocol to improve gait, strength, functional mobility, and pain and to address ongoing goals.      OBJECTIVE IMPAIRMENTS: Abnormal gait, decreased activity tolerance, decreased endurance, decreased knowledge of use of DME, decreased mobility, difficulty walking, decreased ROM, decreased strength, hypomobility, increased edema, impaired flexibility, and pain.    ACTIVITY LIMITATIONS: bending, sitting, standing, squatting, stairs, transfers, bed mobility, dressing, and locomotion level   PARTICIPATION LIMITATIONS: meal prep, cleaning, laundry, driving, shopping, and community activity   PERSONAL FACTORS: 1-2 comorbidity: Arthritis and L TKR are also affecting patient's functional outcome.  REHAB POTENTIAL: Good  CLINICAL DECISION MAKING: Stable/uncomplicated  EVALUATION COMPLEXITY: Low   GOALS:   SHORT TERM GOALS:  Pt will be independent and compliant with HEP for improved pain, ROM, strength, and function. Baseline: Goal status:  GOAL MET  1/7 Target date:  04/08/2024  2.  Pt will demo a good quad set and be able to perform a supine SLR independently for improved strength and stability with gait.  Baseline:  Goal status: GOAL MET   Target date:  04/01/2021  3.  Pt will demo improved L knee  AROM to 0-95 deg for improved gait, stiffness, and mobility.  Baseline:  Goal status: GOAL MET   Target date:  04/15/2024  4.  Pt will demo improved quality of gait and ambulate with a SPC with good stability.  Baseline:  Goal status: PROGRESSING  1/7 Target date:  04/15/2024  5.   Pt will be able to perform her normal transfers with no > than minimal difficulty.   Baseline:  Goal status: ONGOING Target date:  04/29/2024    LONG TERM GOALS:  Target date: 05/27/2024   Pt will demo improved R knee AROM to 0 - 120 deg for improved mobility and stiffness and for improved performance of stairs.  Baseline:  Goal status: GOAL MET  1/7  2.  Pt will ambulate with a normalized heel to toe gait without limping.  Baseline:  Goal status:PROGRESSING  3.  Pt will ambulate community distance without an AD without significant knee pain and difficulty.  Baseline:  Goal status: INITIAL  4.  Pt will be able to perform household chores without significant knee pain and difficulty.  Baseline:  Goal status: INITIAL  5.   Pt will demo improved strength to 4+/5 in R hip abd, 5/5 in Hip flexion, and 5/5 in knee ext and flexion for improved performance of daily functional mobility and to assist with returning to recreational activities.  Baseline:  Goal status: ONGOING  6.  Pt will be able to perform 4-5 steps with a reciprocal gait with the rail for improved performance of stairs.  Baseline:  Goal status: INITIAL   PLAN:  PT FREQUENCY: 2-3x/wk x 1-2 weeks and 2x/wk afterwards   PT DURATION: 6-7 weeks   PLANNED INTERVENTIONS: 97164- PT Re-evaluation, 97750- Physical Performance Testing, 97110-Therapeutic exercises, 97530- Therapeutic activity, 97112- Neuromuscular re-education, 97535- Self Care, 02859- Manual therapy, [phone]- Gait training, [phone]- Aquatic Therapy, [phone]- Electrical stimulation (unattended), Patient/Family education, Balance training, Stair training, Taping, Joint mobilization, Spinal mobilization, Scar mobilization, DME instructions, Cryotherapy, and Moist heat  PLAN FOR NEXT SESSION: Cont per TKR protocol.  Cont with gait training and ice.     Leigh Minerva III PT, DPT 05/05/24 9:20 AM                "

## 2024-05-10 ENCOUNTER — Ambulatory Visit (HOSPITAL_BASED_OUTPATIENT_CLINIC_OR_DEPARTMENT_OTHER): Admitting: Physical Therapy

## 2024-05-13 ENCOUNTER — Encounter (HOSPITAL_BASED_OUTPATIENT_CLINIC_OR_DEPARTMENT_OTHER): Payer: Self-pay | Admitting: Physical Therapy

## 2024-05-13 ENCOUNTER — Ambulatory Visit (HOSPITAL_BASED_OUTPATIENT_CLINIC_OR_DEPARTMENT_OTHER): Attending: Orthopedic Surgery | Admitting: Physical Therapy

## 2024-05-13 DIAGNOSIS — R262 Difficulty in walking, not elsewhere classified: Secondary | ICD-10-CM

## 2024-05-13 DIAGNOSIS — M25661 Stiffness of right knee, not elsewhere classified: Secondary | ICD-10-CM

## 2024-05-13 DIAGNOSIS — M25561 Pain in right knee: Secondary | ICD-10-CM

## 2024-05-13 DIAGNOSIS — M6281 Muscle weakness (generalized): Secondary | ICD-10-CM

## 2024-05-13 NOTE — Therapy (Signed)
 " OUTPATIENT PHYSICAL THERAPY LOWER EXTREMITY TREATMENT     Patient Name: Africa Masaki MRN: [id number] DOB:Mar 27, 1961, 64 y.o., female Today's Date: 05/14/2024  END OF SESSION:  PT End of Session - 05/13/24 1501     Visit Number 16    Number of Visits 22    Date for Recertification  05/27/24    Authorization Type AET[employer]    PT Start Time 1453    PT Stop Time 1536    PT Time Calculation (min) 43 min    Activity Tolerance Patient tolerated treatment well    Behavior During Therapy [facility] for tasks assessed/performed                     Past Medical History:  Diagnosis Date   Allergy    Arthritis    LUMBAR SPINE AND RIGHT HIP   Cataract    GERD (gastroesophageal reflux disease)    History of kidney stones 10/2012   Hyperlipidemia    Hypertension    Renal stone    Past Surgical History:  Procedure Laterality Date   ABDOMINAL HYSTERECTOMY  2000   CHOLECYSTECTOMY  04/09/2007   CYSTOSCOPY WITH URETEROSCOPY Right 10/02/2012   Procedure: CYSTOSCOPY WITH RIGHT RETROGRADE AND STENT PLACEMENT ;  Surgeon: Donnice Gwenyth Brooks, MD;  Location: [facility];  Service: Urology;  Laterality: Right;   CYSTOSCOPY WITH URETEROSCOPY Right 11/13/2012   Procedure: RIGHT URETEROSCOPY;  Surgeon: Donnice Gwenyth Brooks, MD;  Location: [facility];  Service: Urology;  Laterality: Right;  with Stent   CYSTOSCOPY/URETEROSCOPY/HOLMIUM LASER/STENT PLACEMENT Right 05/31/2022   Procedure: CYSTOSCOPY RIGHT URETEROSCOPY/HOLMIUM LASER/STENT PLACEMENT;  Surgeon: Brooks Donnice, MD;  Location: [facility];  Service: Urology;  Laterality: Right;  60 MINS   LITHOTRIPSY  10/06/2012   PUBOVAGINAL SLING N/A 04/15/2017   Procedure: CARLOYN GLADE;  Surgeon: Brooks Donnice, MD;  Location: [facility];  Service: Urology;  Laterality: N/A;  NEEDS 90 MIN FOR PROCEDURE   ROTATOR CUFF REPAIR Left    TYMPANOSTOMY TUBE PLACEMENT Right 2019   to clear up MRSA   There are no active problems to display for this  patient.   REFERRING PROVIDER: Beverley Evalene BIRCH, MD   REFERRING DIAG: presence of R artificial knee joint  L TKR   THERAPY DIAG:  Pain in both knees, unspecified chronicity  Muscle weakness (generalized)  Difficulty in walking, not elsewhere classified  Stiffness of right knee, not elsewhere classified  Rationale for Evaluation and Treatment: Rehabilitation  ONSET DATE: DOS  03/11/24  SUBJECTIVE:   SUBJECTIVE STATEMENT: Pt is 9 weeks s/p R TKR.  Pt denies any adverse effects after prior treatment.  Pt used the octane x ride for 2 miles (16 mins) and then performed her exercises in the hot tub 2 nights ago.  She had no adverse effects.  Pt is performing her HEP.   Pt states she was hurting more in her knees last night and had a 3/10 pain.  She used ice.     PERTINENT HISTORY: R TKR 03/11/24 L TKR  12/04/2023 Arthritis in lumbar and R hip also HTN  PAIN:  anterior knees at patella bilat 1-2/10 current.  Aching pain       PRECAUTIONS: Other: bilat TKR    WEIGHT BEARING RESTRICTIONS: none indicated  FALLS:  Has patient fallen in last 6 months? Yes. Number of falls 4: R ankle rolled with new shoes ;  fell in the bathroom when the towel bar broke off the wall  on wet leaves, walking up a ramp  LIVING ENVIRONMENT: Lives with: lives with their spouse Lives in: 1 story home Stairs: 3 steps to enter home with 1 rail Has following equipment at home: FWW, crutches, SPC, 3 pronged cane  OCCUPATION: part time home care, primarily sitting   PLOF: Independent  PATIENT GOALS: to improve pain and mobility   OBJECTIVE:  Note: Objective measures were completed at Evaluation unless otherwise noted.  DIAGNOSTIC FINDINGS: Pt is post op.   PATIENT SURVEYS:  LEFS:  10/80  (Eval) LEFS:   32/80  (04/14/24)     LOWER EXTREMITY ROM:   ROM Right eval Left eval Right 12/18 Right 12/26 Right 12/29 Right 1/2 After PROM Right 1/6 Before PROM  Hip flexion          Hip extension         Hip abduction         Hip adduction         Hip internal rotation         Hip external rotation         Knee flexion 81 PROM; 77 AROM  94 AROM 107 AROM  123 122  Knee extension 4 AROM  1 AROM 1 AROM ; 0 deg after stretching 1 AROM ; 0 deg after stretching 0 0  Ankle dorsiflexion         Ankle plantarflexion         Ankle inversion         Ankle eversion          (Blank rows = not tested)    LOWER EXTREMITY MMT:   Strength not tested due to recent surgery and post op considerations.   FUNCTIONAL TESTS:  Pt required assistance with lifting LE with bed mobility.   GAIT: Comments: pt ambulating with FWW with a heel to toe gait with limited toe off and increased UE Wb'ing through walker.                                                                                                                                TREATMENT:   05/13/2024 Recumbent bike lvl 2-3 x 6 mins Supine SLR 1.5# 3x10 reps LAQ 3#  3x10 Seated HS curl with RTB 3x10 Standing hip abd 2x10 R LE, x 10 L LE with RTB above knees  Step ups x10 reps each on 6 and 8 inch step with UE support on rail Lateral step ups 6 inch 2x10 Mini squats 2x10 with UE support on rail  Pt received R knee flexion and extension PROM in supine.     PATIENT EDUCATION:  Education details:  Educated pt in post op and protocol expectations.  HEP, dx, prognosis, exercise form, and POC.  PT answered pt's questions.  Person educated: Patient Education method: Explanation, Demonstration, Tactile cues, Verbal cues, and Handouts Education comprehension: verbalized understanding, returned demonstration, verbal cues required, and tactile cues required  HOME EXERCISE PROGRAM: Exercises - Supine Quadricep Sets  - 2 x daily - 7 x weekly - 2 sets - 10 reps - 5 seconds hold - Supine Gluteal Sets  - 2 x daily - 7 x weekly - 2 sets - 10 reps - 5 seconds hold - Supine Ankle Pumps  - 7 x weekly - Longsitting Heel Slide  - 2-3  x daily - 7 x weekly - 1 sets - 10 reps -Sitting with knee flexed for 3-5 mins comfortably at various times for 6-7 times/day   Access Code: [email or web address] URL: [email or web address] Date: 03/23/2024 Prepared by: Mose Minerva  Exercises - Supine Short Arc Quad  - 1 x daily - 7 x weekly - 2 sets - 10 reps - Small Range Straight Leg Raise  - 2 x daily - 7 x weekly - 1 sets - 7-10 reps - Long Sitting Calf Stretch with Strap  - 2 x daily - 7 x weekly - 2-3 reps - 20 seconds hold - Supine Heel Slide with Strap  - 2 x daily - 7 x weekly - 2 sets - 10 reps - Heel Raises with Counter Support  - 1 x daily - 7 x weekly - 2 sets - 10 reps - Standing Terminal Knee Extension with Resistance  - 1 x daily - 5-6 x weekly - 2 sets - 10 reps - Side Stepping with Counter Support  - 1 x daily - 7 x weekly - 3 sets - Standing Hip Abduction with Counter Support  - 1 x daily - 7 x weekly - 2 sets - 10 reps  ASSESSMENT:  CLINICAL IMPRESSION: PT performed knee PROM and she tolerated PROM well.  She continues to have excellent knee ROM.  Pt is improving with quad and LE strength as evidenced by performance of exercises.  Pt was challenged with 8 inch step ups though was able to perform.  Pt has been going to the gym to perform the octane x ride and aquatic exercises.  She responded well to treatment reporting no increased pain and was appropraitely fatigued after treatment.  She will benefit from cont skilled PT per protocol to improve gait, strength, functional mobility, and pain and to address ongoing goals.     OBJECTIVE IMPAIRMENTS: Abnormal gait, decreased activity tolerance, decreased endurance, decreased knowledge of use of DME, decreased mobility, difficulty walking, decreased ROM, decreased strength, hypomobility, increased edema, impaired flexibility, and pain.    ACTIVITY LIMITATIONS: bending, sitting, standing, squatting, stairs, transfers, bed mobility, dressing, and locomotion level    PARTICIPATION LIMITATIONS: meal prep, cleaning, laundry, driving, shopping, and community activity   PERSONAL FACTORS: 1-2 comorbidity: Arthritis and L TKR are also affecting patient's functional outcome.   REHAB POTENTIAL: Good  CLINICAL DECISION MAKING: Stable/uncomplicated  EVALUATION COMPLEXITY: Low   GOALS:   SHORT TERM GOALS:  Pt will be independent and compliant with HEP for improved pain, ROM, strength, and function. Baseline: Goal status:  GOAL MET  1/7 Target date:  04/08/2024  2.  Pt will demo a good quad set and be able to perform a supine SLR independently for improved strength and stability with gait.  Baseline:  Goal status: GOAL MET   Target date:  04/01/2021  3.  Pt will demo improved L knee  AROM to 0-95 deg for improved gait, stiffness, and mobility.  Baseline:  Goal status: GOAL MET   Target date:  04/15/2024  4.  Pt will demo improved quality of gait and ambulate with a  SPC with good stability.  Baseline:  Goal status: PROGRESSING  1/7 Target date:  04/15/2024  5.   Pt will be able to perform her normal transfers with no > than minimal difficulty.   Baseline:  Goal status: ONGOING Target date:  04/29/2024    LONG TERM GOALS: Target date: 05/27/2024   Pt will demo improved R knee AROM to 0 - 120 deg for improved mobility and stiffness and for improved performance of stairs.  Baseline:  Goal status: GOAL MET  1/7  2.  Pt will ambulate with a normalized heel to toe gait without limping.  Baseline:  Goal status:PROGRESSING  3.  Pt will ambulate community distance without an AD without significant knee pain and difficulty.  Baseline:  Goal status: INITIAL  4.  Pt will be able to perform household chores without significant knee pain and difficulty.  Baseline:  Goal status: INITIAL  5.   Pt will demo improved strength to 4+/5 in R hip abd, 5/5 in Hip flexion, and 5/5 in knee ext and flexion for improved performance of daily functional mobility and  to assist with returning to recreational activities.  Baseline:  Goal status: ONGOING  6.  Pt will be able to perform 4-5 steps with a reciprocal gait with the rail for improved performance of stairs.  Baseline:  Goal status: INITIAL   PLAN:  PT FREQUENCY: 2-3x/wk x 1-2 weeks and 2x/wk afterwards   PT DURATION: 6-7 weeks   PLANNED INTERVENTIONS: 97164- PT Re-evaluation, 97750- Physical Performance Testing, 97110-Therapeutic exercises, 97530- Therapeutic activity, 97112- Neuromuscular re-education, 97535- Self Care, [id number]- Manual therapy, [phone]- Gait training, [phone]- Aquatic Therapy, [phone]- Electrical stimulation (unattended), Patient/Family education, Balance training, Stair training, Taping, Joint mobilization, Spinal mobilization, Scar mobilization, DME instructions, Cryotherapy, and Moist heat  PLAN FOR NEXT SESSION: Cont per TKR protocol.  Cont with gait training and ice.     Leigh Minerva III PT, DPT 05/14/24 2:35 PM                 "

## 2024-05-17 ENCOUNTER — Ambulatory Visit (HOSPITAL_BASED_OUTPATIENT_CLINIC_OR_DEPARTMENT_OTHER): Admitting: Physical Therapy

## 2024-05-20 ENCOUNTER — Encounter (HOSPITAL_BASED_OUTPATIENT_CLINIC_OR_DEPARTMENT_OTHER): Admitting: Physical Therapy

## 2024-05-24 ENCOUNTER — Encounter (HOSPITAL_BASED_OUTPATIENT_CLINIC_OR_DEPARTMENT_OTHER): Admitting: Physical Therapy

## 2024-05-27 ENCOUNTER — Encounter (HOSPITAL_BASED_OUTPATIENT_CLINIC_OR_DEPARTMENT_OTHER): Admitting: Physical Therapy

## 2024-05-31 ENCOUNTER — Encounter (HOSPITAL_BASED_OUTPATIENT_CLINIC_OR_DEPARTMENT_OTHER): Admitting: Physical Therapy

## 2024-06-03 ENCOUNTER — Encounter (HOSPITAL_BASED_OUTPATIENT_CLINIC_OR_DEPARTMENT_OTHER): Admitting: Physical Therapy
